# Patient Record
Sex: Female | Born: 1935 | Race: White | Hispanic: No | Smoking: Never smoker
Health system: Southern US, Community
[De-identification: ages and names within clinical notes are randomized; demographics above are authoritative.]

## PROBLEM LIST (undated history)

## (undated) DIAGNOSIS — D649 Anemia, unspecified: Secondary | ICD-10-CM

## (undated) DIAGNOSIS — IMO0002 Reserved for concepts with insufficient information to code with codable children: Secondary | ICD-10-CM

## (undated) DIAGNOSIS — K621 Rectal polyp: Secondary | ICD-10-CM

## (undated) DIAGNOSIS — E559 Vitamin D deficiency, unspecified: Secondary | ICD-10-CM

## (undated) DIAGNOSIS — N1330 Unspecified hydronephrosis: Secondary | ICD-10-CM

## (undated) DIAGNOSIS — N183 Chronic kidney disease, stage 3 (moderate): Secondary | ICD-10-CM

## (undated) DIAGNOSIS — G309 Alzheimer's disease, unspecified: Secondary | ICD-10-CM

## (undated) DIAGNOSIS — K623 Rectal prolapse: Secondary | ICD-10-CM

## (undated) DIAGNOSIS — N8110 Cystocele, unspecified: Secondary | ICD-10-CM

## (undated) DIAGNOSIS — F0280 Dementia in other diseases classified elsewhere without behavioral disturbance: Secondary | ICD-10-CM

## (undated) DIAGNOSIS — E785 Hyperlipidemia, unspecified: Secondary | ICD-10-CM

## (undated) DIAGNOSIS — K648 Other hemorrhoids: Principal | ICD-10-CM

## (undated) DIAGNOSIS — Z9181 History of falling: Secondary | ICD-10-CM

## (undated) HISTORY — DX: Anemia, unspecified: D64.9

## (undated) HISTORY — DX: Unspecified hydronephrosis: N13.30

## (undated) HISTORY — DX: Hyperlipidemia, unspecified: E78.5

## (undated) HISTORY — DX: History of falling: Z91.81

## (undated) HISTORY — PX: JOINT REPLACEMENT: SHX530

## (undated) HISTORY — PX: CATARACT EXTRACTION W/ INTRAOCULAR LENS  IMPLANT, BILATERAL: SHX1307

## (undated) HISTORY — DX: Chronic kidney disease, stage 3 (moderate): N18.3

## (undated) HISTORY — PX: APPENDECTOMY: SHX54

## (undated) HISTORY — DX: Alzheimer's disease, unspecified: G30.9

## (undated) HISTORY — DX: Vitamin D deficiency, unspecified: E55.9

## (undated) HISTORY — DX: Dementia in other diseases classified elsewhere, unspecified severity, without behavioral disturbance, psychotic disturbance, mood disturbance, and anxiety: F02.80

## (undated) HISTORY — DX: Other hemorrhoids: K64.8

## (undated) HISTORY — DX: Cystocele, unspecified: N81.10

## (undated) HISTORY — PX: OTHER SURGICAL HISTORY: SHX169

---

## 2002-12-06 ENCOUNTER — Emergency Department (HOSPITAL_COMMUNITY): Admission: EM | Admit: 2002-12-06 | Discharge: 2002-12-06 | Payer: Self-pay | Admitting: Emergency Medicine

## 2002-12-06 ENCOUNTER — Encounter: Payer: Self-pay | Admitting: Emergency Medicine

## 2004-07-07 ENCOUNTER — Inpatient Hospital Stay (HOSPITAL_COMMUNITY): Admission: EM | Admit: 2004-07-07 | Discharge: 2004-07-13 | Payer: Self-pay | Admitting: Emergency Medicine

## 2011-07-16 ENCOUNTER — Inpatient Hospital Stay (HOSPITAL_COMMUNITY)
Admission: EM | Admit: 2011-07-16 | Discharge: 2011-07-22 | DRG: 534 | Disposition: A | Discharge status: Skilled Nursing Facility | Payer: Medicare Other | Attending: Internal Medicine | Admitting: Internal Medicine

## 2011-07-16 ENCOUNTER — Emergency Department (HOSPITAL_COMMUNITY): Payer: Medicare Other

## 2011-07-16 DIAGNOSIS — Y92009 Unspecified place in unspecified non-institutional (private) residence as the place of occurrence of the external cause: Secondary | ICD-10-CM

## 2011-07-16 DIAGNOSIS — S72309A Unspecified fracture of shaft of unspecified femur, initial encounter for closed fracture: Principal | ICD-10-CM | POA: Diagnosis present

## 2011-07-16 DIAGNOSIS — Y998 Other external cause status: Secondary | ICD-10-CM

## 2011-07-16 DIAGNOSIS — M810 Age-related osteoporosis without current pathological fracture: Secondary | ICD-10-CM | POA: Diagnosis present

## 2011-07-16 DIAGNOSIS — N8111 Cystocele, midline: Secondary | ICD-10-CM | POA: Diagnosis present

## 2011-07-16 DIAGNOSIS — Z96649 Presence of unspecified artificial hip joint: Secondary | ICD-10-CM

## 2011-07-16 DIAGNOSIS — D259 Leiomyoma of uterus, unspecified: Secondary | ICD-10-CM | POA: Diagnosis present

## 2011-07-16 DIAGNOSIS — N189 Chronic kidney disease, unspecified: Secondary | ICD-10-CM | POA: Diagnosis present

## 2011-07-16 DIAGNOSIS — D518 Other vitamin B12 deficiency anemias: Secondary | ICD-10-CM | POA: Diagnosis present

## 2011-07-16 DIAGNOSIS — X58XXXA Exposure to other specified factors, initial encounter: Secondary | ICD-10-CM | POA: Diagnosis present

## 2011-07-16 DIAGNOSIS — B9689 Other specified bacterial agents as the cause of diseases classified elsewhere: Secondary | ICD-10-CM | POA: Diagnosis present

## 2011-07-16 DIAGNOSIS — Z7982 Long term (current) use of aspirin: Secondary | ICD-10-CM

## 2011-07-16 DIAGNOSIS — N390 Urinary tract infection, site not specified: Secondary | ICD-10-CM | POA: Diagnosis present

## 2011-07-16 DIAGNOSIS — D509 Iron deficiency anemia, unspecified: Secondary | ICD-10-CM | POA: Diagnosis present

## 2011-07-16 DIAGNOSIS — K8020 Calculus of gallbladder without cholecystitis without obstruction: Secondary | ICD-10-CM | POA: Diagnosis present

## 2011-07-16 DIAGNOSIS — N1330 Unspecified hydronephrosis: Secondary | ICD-10-CM | POA: Diagnosis present

## 2011-07-16 LAB — DIFFERENTIAL
Basophils Relative: 0 % (ref 0–1)
Eosinophils Absolute: 0.1 10*3/uL (ref 0.0–0.7)
Monocytes Absolute: 0.6 10*3/uL (ref 0.1–1.0)
Monocytes Relative: 7 % (ref 3–12)

## 2011-07-16 LAB — BASIC METABOLIC PANEL
Calcium: 9.9 mg/dL (ref 8.4–10.5)
Creatinine, Ser: 2.39 mg/dL — ABNORMAL HIGH (ref 0.50–1.10)
GFR calc non Af Amer: 19 mL/min — ABNORMAL LOW (ref >60)
Glucose, Bld: 111 mg/dL — ABNORMAL HIGH (ref 70–99)
Sodium: 137 mEq/L (ref 135–145)

## 2011-07-16 LAB — CBC
Hemoglobin: 10.6 g/dL — ABNORMAL LOW (ref 12.0–15.0)
MCH: 29.9 pg (ref 26.0–34.0)
MCHC: 32.5 g/dL (ref 30.0–36.0)
Platelets: 255 10*3/uL (ref 150–400)

## 2011-07-17 ENCOUNTER — Inpatient Hospital Stay (HOSPITAL_COMMUNITY): Payer: Medicare Other

## 2011-07-17 LAB — BASIC METABOLIC PANEL
CO2: 21 mEq/L (ref 19–32)
Calcium: 9.1 mg/dL (ref 8.4–10.5)
GFR calc Af Amer: 23 mL/min — ABNORMAL LOW (ref >60)
GFR calc non Af Amer: 19 mL/min — ABNORMAL LOW (ref >60)
Sodium: 137 mEq/L (ref 135–145)

## 2011-07-17 LAB — CBC
MCH: 30.1 pg (ref 26.0–34.0)
MCHC: 32.4 g/dL (ref 30.0–36.0)
Platelets: 247 10*3/uL (ref 150–400)
RBC: 3.12 MIL/uL — ABNORMAL LOW (ref 3.87–5.11)
RDW: 14 % (ref 11.5–15.5)

## 2011-07-17 LAB — IRON AND TIBC
Iron: 37 ug/dL — ABNORMAL LOW (ref 42–135)
TIBC: 208 ug/dL — ABNORMAL LOW (ref 250–470)

## 2011-07-17 NOTE — H&P (Signed)
[name], [name]                   ACCOUNT NO.:  [id number]  MEDICAL RECORD NO.:  [id number]  LOCATION:  WLED                         FACILITY:  [facility]  PHYSICIAN:  Gery Pray, MD      DATE OF BIRTH:  Jun 27, 1919  DATE OF ADMISSION:  07/16/2011 DATE OF DISCHARGE:                             HISTORY & PHYSICAL   PRIMARY CARE PHYSICIAN:  None.  CODE STATUS:  Full code.    The patient goes to team five.  CHIEF COMPLAINT:  Pain, left thigh.  HISTORY OF PRESENT ILLNESS:  This is a rather pleasant and healthy 77- year-old female who lives alone, ambulates with a cane.  She does not have a PCP as she has no health issues.  Today, she was walking down the driveway to retrieve her mail; on the way back, she developed some pain in her left leg.  She was able to make it to the step, however, she was not able to make it up the steps.  She is sat.  Her neighbor called her son who lived 2 miles away.  He and his wife were able to get her upstairs.  However, she remained nonambulatory. 911 was called, she was brought to the ER.  In the ER, imaging done.  All images are normal. However, the patient is not able to bear weight on that left extremity, secondary to pain, despite the fact that she has been given pain medications.  The hospitalist have been called with a request for admission.  History obtained from the patient and her son who is at the bedside.  Both are appeared to be reliable historians.  The patient is mentating clearly.  PAST MEDICAL HISTORY:  None.  PAST SURGICAL HISTORY:  Appendectomy, left hip replacement, and bilateral wrist surgeries for fractures.  MEDICATION:  Aspirin daily.  ALLERGIES:  No known drug allergies.  SOCIAL HISTORY:  Negative for tobacco, alcohol, or illicit drugs.  No home oxygen.  She uses a cane.  She lives alone.  Her son is Leonette Most, lives 2 miles away.  FAMILY HISTORY:  Significant for diabetes mellitus, a brother and coronary artery  disease, her father.  REVIEW OF SYSTEMS:  All 10-point systems reviewed.  They are negative, except as noted in HPI.  PHYSICAL EXAMINATION:  VITALS:  Blood pressure 156/66, pulse 92, respirations 16, temperature 98, sat 96% on room air. GENERAL:  Pleasant, alert, oriented female, currently in no acute distress. AAA:  Pink conjunctivae.  PERLA. ENT: Moist oral mucosa.  Trachea midline. NECK:  Supple.  No thyromegaly. LUNGS:  Clear to auscultation bilaterally.  No use of accessory muscles. CARDIOVASCULAR:  Regular rate and rhythm without murmurs, rigors, or gallops.  No JVD. ABDOMEN:  Soft, positive bowel sounds, nontender, nondistended.  No organomegaly.  NEURO:  Cranial nerves II through XII, grossly intact. Sensation intact. MUSCULOSKELETAL:  Correction strength 5/5 in all extremities.  No clubbing, cyanosis or edema.  The patient does have point tenderness at the back of the left calf and left thigh. SKIN: No rashes.  No subcutaneous crepitations. PSYCH:  Alert, oriented, appropriate, well-groomed female, appropriate.  LABS:  Sodium 137, potassium 5.2, chloride 105,  CO2 of 22, glucose 111, BUN 56, creatinine was 2.39, and calcium 9.9.  Left hip x-rays, no fractures seen the left hip.  The patient has arthroplasty, appears intact.  X-ray of left knee, 4-reviews, no evidence of fractures or dislocation.  Diffuse osteopenia.  White blood count 8.5, hemoglobin 10.6, platelets 255.  ASSESSMENT AND PLAN: 1. Intractable pain, left thigh, question pulled muscles, tendonitis.  The patient     will be admitted, pain medications will be ordered.  Physical     Therapy will be consulted.  We will order scheduled Tylenol around-     the-clock for now. 2. Kidney disease, likely chronic; however, unclear.  The patient has     not been to a physician in years.  We will order some IV fluid     hydrate some gentle intravenous fluid hydration to see if this     corrects.  No further workup will  be ordered at this point no renal     ultrasound.          ______________________________ Gery Pray, MD     DC/MEDQ  D:  07/16/2011  T:  07/16/2011  Job:  [id number]  Electronically Signed by Gery Pray MD on 07/17/2011 03:02:53 AM

## 2011-07-18 LAB — CBC
MCHC: 32 g/dL (ref 30.0–36.0)
Platelets: 222 10*3/uL (ref 150–400)
RDW: 13.9 % (ref 11.5–15.5)
WBC: 5.2 10*3/uL (ref 4.0–10.5)

## 2011-07-18 LAB — URINE MICROSCOPIC-ADD ON

## 2011-07-18 LAB — BASIC METABOLIC PANEL
Chloride: 108 mEq/L (ref 96–112)
Creatinine, Ser: 2.52 mg/dL — ABNORMAL HIGH (ref 0.50–1.10)
GFR calc Af Amer: 22 mL/min — ABNORMAL LOW (ref >60)
GFR calc non Af Amer: 18 mL/min — ABNORMAL LOW (ref >60)
Potassium: 4.8 mEq/L (ref 3.5–5.1)

## 2011-07-18 LAB — URINALYSIS, ROUTINE W REFLEX MICROSCOPIC
Glucose, UA: NEGATIVE mg/dL
Specific Gravity, Urine: 1.006 (ref 1.005–1.030)
pH: 8 (ref 5.0–8.0)

## 2011-07-19 LAB — BASIC METABOLIC PANEL
BUN: 58 mg/dL — ABNORMAL HIGH (ref 6–23)
Chloride: 108 mEq/L (ref 96–112)
GFR calc Af Amer: 22 mL/min — ABNORMAL LOW (ref >60)
GFR calc non Af Amer: 18 mL/min — ABNORMAL LOW (ref >60)
Potassium: 4.6 mEq/L (ref 3.5–5.1)
Sodium: 134 mEq/L — ABNORMAL LOW (ref 135–145)

## 2011-07-19 LAB — CBC
HCT: 29.9 % — ABNORMAL LOW (ref 36.0–46.0)
Hemoglobin: 9.5 g/dL — ABNORMAL LOW (ref 12.0–15.0)
MCHC: 31.8 g/dL (ref 30.0–36.0)
RDW: 13.9 % (ref 11.5–15.5)
WBC: 5.1 10*3/uL (ref 4.0–10.5)

## 2011-07-20 LAB — BASIC METABOLIC PANEL
BUN: 59 mg/dL — ABNORMAL HIGH (ref 6–23)
CO2: 19 mEq/L (ref 19–32)
Chloride: 109 mEq/L (ref 96–112)
GFR calc non Af Amer: 20 mL/min — ABNORMAL LOW (ref >60)
Glucose, Bld: 91 mg/dL (ref 70–99)
Potassium: 4.8 mEq/L (ref 3.5–5.1)
Sodium: 137 mEq/L (ref 135–145)

## 2011-07-20 LAB — CBC
HCT: 30.3 % — ABNORMAL LOW (ref 36.0–46.0)
Hemoglobin: 9.8 g/dL — ABNORMAL LOW (ref 12.0–15.0)
MCHC: 32.3 g/dL (ref 30.0–36.0)
RBC: 3.29 MIL/uL — ABNORMAL LOW (ref 3.87–5.11)
WBC: 5.2 10*3/uL (ref 4.0–10.5)

## 2011-07-21 NOTE — Consult Note (Signed)
[name], [name]                   ACCOUNT NO.:  [id number]  MEDICAL RECORD NO.:  [id number]  LOCATION:  1508                         FACILITY:  [facility]  PHYSICIAN:  [name], MD     DATE OF BIRTH:  07-30-1919  DATE OF CONSULTATION: DATE OF DISCHARGE:                                CONSULTATION   REFERRING PHYSICIAN:  Gery Pray, MD  REASON FOR CONSULTATION:  Hydronephrosis, elevated creatinine, possible pelvic organ prolapse.  HISTORY OF PRESENT ILLNESS:  [name] is a vigorous 77 year old female who takes no medications and has very limited medical care.  She noted to have acute onset of left hip pain and has prior history of left hip surgery and admitted to hospital service for possible fracture where she is noted to have a high creatinine of 2.4.  Renal exam was obtained which revealed moderate-to-severe hydroureteronephrosis down to the bladder.  The sonographer on this exam noted significant prolapse.  On discussion with the patient, she denies any dysuria or hematuria.  She is a G1, P1 from a vaginal delivery.  She does admit to many years of pelvic organ prolapse which she has learnt to adapt to mostly by manually reducing her bladder when she voids.  She has minimal stress incontinence by history with leakage of small volumes of urine with cough, sneeze, and change in position, but no evidence of leakage between the voids.  She feels like she voids at completion.  PAST MEDICAL HISTORY:  Remote history of left hip replacement, otherwise, unknown.  Again, this patient has very limited medical contact.  SURGICAL HISTORY:  Hip replacement and appendectomy.  CURRENT MEDICATIONS:  Daily aspirin.  ALLERGIES:  No known drug allergies.  FAMILY HISTORY:  Significant for diabetes and heart disease.  SOCIAL HISTORY:  The patient is retired from [employer].  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  She does not smoke or use illicit  substances.  REVIEW OF SYSTEMS:  NEUROLOGIC:  Denies any new weakness or numbness. CARDIAC:  Denies any chest pain.  PULMONARY:  Denies shortness of breath.  GASTROINTESTINAL:  Denies blood in stool.  GENITOURINARY: Denies blood in urine. Does admit to pelvic organ prolapse. MUSCULOSKELETAL:  Admits to recent left hip pain.  HEMATOLOGIC:  Denies easy bruising.  PSYCHIATRIC:  Denies depressed mood.  PHYSICAL EXAMINATION:  VITAL SIGNS:  Temp 98.3, pulse 68, respirations 18, and blood pressure 120/63. GENERAL:  [name] is a very pleasant female who appears much younger than her stated age. HEENT:  Normocephalic, atraumatic. CARDIOVASCULAR:  Regular rate and rhythm. PULMONARY:  Clear to auscultation. ABDOMINAL:  Mildly obese, nontender, and nondistended.  No masses appreciated.  No palpable bladder. EXTREMITIES:  2+ pulses throughout.  There is mild swelling of the left extremity. GENITOURINARY:  There is a very impressive grade 4 pelvic organ prolapse with the cervix arresting approximately 6 inches inferior to the introitus in a lying position.  This is easily reduced manually by the physician and by the patient.  LABORATORY INVESTIGATION:  Creatinine 2.4, potassium 4.4, hemoglobin 9.4.  IMAGING:  Reviewed renal ultrasound and concur the finding of moderate  hydroureteronephrosis at the level of the bladder.  The bladder was mildly distended during the study, again this is bilateral.  ASSESSMENT AND PLAN: 1. Hydronephrosis.  Elevated creatinine is likely a chronic process     due to obstruction very distally from pelvic organ prolapse that is     severe.  Patient's potassium is normal and she denies symptoms of     azotemia.  We reiterated to her the importance of good kidney     function and the need for complete voiding. 2. Pelvic organ prolapse.  The patient has adapted very well to her     pelvic organ prolapse and that she reduces her bladder when she     voids.  We  reiterated the importance of this as well as double     voiding to ensure no postvoid residual.  The patient does this     during exam and is able to achieve a postvoid residual of less than     165.  We discussed options including a pessary or surgical options     for pelvic organ prolapse, and the patient declines at this time. 3. Followup.  I discussed with the patient again the importance of     maintaining good kidney function and recommended followup with me     in the office.  Given a history of lack of medical contact, I     reiterated this and she voiced understanding.          ______________________________ [name], MD     TM/MEDQ  D:  07/17/2011  T:  07/18/2011  Job:  [id number]  Electronically Signed by Lindaann Slough M.D. on 07/21/2011 05:23:53 PM

## 2011-07-22 LAB — URINE CULTURE
Colony Count: >=100000
Culture  Setup Time: 201208210507
Special Requests: NEGATIVE

## 2011-07-29 NOTE — Discharge Summary (Signed)
[name], [name]                   ACCOUNT NO.:  [id number]  MEDICAL RECORD NO.:  [id number]  LOCATION:  1508                         [facility]ACILITY:  [facility]  PHYSICIAN:  [name], M.D.     DATE OF BIRTH:  18-May-1919  DATE OF ADMISSION:  07/16/2011 DATE OF DISCHARGE:                              DISCHARGE SUMMARY   PRIMARY CARE PHYSICIAN:  The patient is unable to remember.  PRESENTING COMPLAINT:  Pain in left thigh.  DISCHARGE DIAGNOSES: 1. Acute left leg pain, possibly secondary to occult fracture.  The     patient was evaluated by Ortho. 2. Chronic hydronephrosis secondary to bladder prolapse resulting in     chronic renal failure. 3. Urinary tract infection with Klebsiella oxytoca sensitive to Cipro.     Today is day 5 out of 7 of treatment. 4. Anemia with iron and B12 deficiency.  No further workup done during     hospital stay.  DISCHARGE MEDICATIONS: 1. Aspirin 81 mg daily.  The patient was previously taking 325. 2. Ciprofloxacin 250 mg daily for 3 more days.  This would include     today. 3. Cyanocobalamin 1000 mcg monthly. 4. Ferrous sulfate 325 mg by mouth twice a day with meal. 5. Calcium OTC 1 tablet twice a day.  STOPPED THE FOLLOWING MEDICINE:  Aspirin 325 mg daily.  PROCEDURES: 1. Left knee 4-view on July 16, 2011, no evidence of fracture or     dislocation, diffuse osteopenia, visualized osseous structures. 2. X-ray hip left complete July 16, 2011, no evidence of fracture or     dislocation, left hip hemiarthroplasty appears intact without     evidence of loosening or fracture.  Calcified fibroids incidentally     noted. 3. MRI of the left hip without contrast July 17, 2011.  There is     abnormal soft tissue signal medial to the proximal left femoral     diaphysis extending to the distal end of the femoral prosthesis,     status post bipolar hemiarthroplasty.  While findings may nearly     reflect a soft tissue injury, an underlying nondisplaced  fracture     of stress fracture of the femur cannot be excluded.  No evidence of     pelvic fracture or acute right hip abnormality.  Distended     trabeculated bladder with bilateral ureterectasis likely related to     apparent cystocele formation. 4. Ultrasound renal July 17, 2011, severe bilateral hydronephrosis.     In comparison to the accompanying MRI, ureteral dilatation extends     all the way to the bladder base.  The bladder base has prolapsed     beyond the pelvic floor and this is likely the cause of the     ureteral obstruction.  Incidental cholelithiasis. 5. CT left hip without contrast.  Left bipolar hip hemiarthroplasty.     Subperiosteal new bone around the distal stem is consistent with     chronic stress reaction.  Correlating with MRI, there was bone     marrow edema and soft tissue edema surrounding this region.  CONSULTANTS: 1. Orthopedic consult with [name], MD. 2.  Urology consult with [name], MD.  HOSPITAL COURSE:  This is a 77 year old female who came into the ER with a complaint of pain in the left thigh.  Please see H and P dictated on July 16, 2011, for further details.  Above-mentioned workup was done and a consult with Orthopedics was requested.  According to Dr. Diamantina Providence transcription it could be an occult fracture versus a stress reaction. His plan was touchdown weightbearing with physical therapy and potential surgery if she is not able to progress with her weightbearing.  The patient was evaluated by Physical Therapy and interestingly was able to walk without any pain.  She has continued to walk since that day without pain, but with assistance.  She had not required any pain medications. According to Physical Therapy and Occupational Therapy notes, the patient requires skilled facility.  The patient was also found to have renal failure on admission with a BUN of 57 and creatinine of 2.41.  We do not have old records, and therefore,  we were unable to compare this.  However throughout her stay, these numbers have remained essentially stable.  Further workup revealed that she had bladder prolapse and a hydronephrosis, which is thought to be chronic.  She was evaluated by Dr. [name], who discussed with her option such as pessary or surgery for pelvic organ prolapse and the patient declined.  The patient did admit to him that she is able to reduce her bladder and voids quite well.  He recommended double voiding to ensure no postvoid residual and he recommended that she follow up with him.  We will be trying to make an appointment if we are unable to make an appointment, we will leave a number on the discharge paper work and appointment can be made by the patient's son.  The patient was found to have a positive UA.  She was started on ciprofloxacin.  Urine culture has resulted in Klebsiella oxytoca, which is sensitive to ciprofloxacin.  Today is day 5 of her treatment, I was considering giving her a 7-day course.  The patient was also noted to be anemic on her blood work with a hemoglobin of 9.4.  Anemia panel was performed and she was found to be iron deficient with iron level of 37 and a percent saturation of 18. Total iron binding capacity was also low.  Ferritin was low normal at 32. Vitamin B12 was slightly below the normal at 205, cutoff being 211. She has been receiving 100 mcg of IM vitamin B12 injections for the past 4 days.  We have also started her on p.o. iron.  We have not yet done stool occults or any further workup.  This should be done as an outpatient.  The patient is being discharged today to a nursing facility.  She and her son are in agreement with this.  Hopefully after being discharged from the facility, she can go to live with her son.  She was previously living at home alone.  PHYSICAL EXAMINATION:  GENERAL:  She is awake, alert, and oriented x3. LUNGS:  Clear. HEART:  Regular rate  and rhythm.  No murmurs. ABDOMEN:  Soft, nontender, and nondistended.  Bowel sounds positive. EXTREMITIES:  No cyanosis, clubbing, or edema.  CONDITION ON DISCHARGE:  Stable.  FOLLOWUP INSTRUCTIONS: 1. Follow up with PCP, again the patient cannot recall the name. 2. Follow up with Dr. [name], Urology. 3. Follow up with Dr. Dorene Grebe, Orthopedics.  Time on patient care and discharge  was 50 minutes.     [name], M.D.     SR/MEDQ  D:  07/22/2011  T:  07/22/2011  Job:  [id number]  cc:   G. Dorene Grebe, M.D. Fax: [phone]  [name], MD Fax: [phone]  Electronically Signed by [name] M.D. on 07/29/2011 10:58:44 AM

## 2011-08-05 ENCOUNTER — Ambulatory Visit
Admission: RE | Admit: 2011-08-05 | Discharge: 2011-08-05 | Disposition: A | Discharge status: Home / Self Care | Payer: Medicare Other | Source: Ambulatory Visit | Attending: Orthopedic Surgery | Admitting: Orthopedic Surgery

## 2011-08-05 ENCOUNTER — Other Ambulatory Visit: Payer: Self-pay | Admitting: Orthopedic Surgery

## 2011-08-05 DIAGNOSIS — R609 Edema, unspecified: Secondary | ICD-10-CM

## 2011-08-05 DIAGNOSIS — R52 Pain, unspecified: Secondary | ICD-10-CM

## 2011-08-10 NOTE — Consult Note (Signed)
NAMEHARRIETTE, [name]                   ACCOUNT NO.:  [id number]  MEDICAL RECORD NO.:  [id number]  LOCATION:  1508                         FACILITY:  [facility]  PHYSICIAN:  [name], M.D.    DATE OF BIRTH:  September 30, 1919  DATE OF CONSULTATION:  07/18/2011 DATE OF DISCHARGE:                                CONSULTATION   REFERRING PHYSICIAN:  Consult is requested by [facility].  CHIEF COMPLAINT:  Left leg pain.  HISTORY OF PRESENT ILLNESS:  [name] is a 77 year old ambulatory female who lives alone at home.  Two days ago, she was picking up the paper from the driveway.  She bent over and picked up the paper and was walking back and had acute onset of left leg pain to the point where she could not weightbear.  She had no prodromal symptoms.  Denies any back pain, numbness, and tingling in the leg.  After continued inability to bear weight on the leg, she was taken by ambulance here for further evaluation.  PAST MEDICAL HISTORY:  Negative. PAST SURGICAL HISTORY:  Appendectomy.  She did have a left bipolar hemiarthroplasty done in approximately 2005 by Dr. Chaney Malling with AML press-fit prosthesis; she did well with that surgery until her current situation.  MEDICATIONS:  Aspirin daily.  ALLERGIES:  No known drug allergies.  The patient's social history is negative for alcohol, tobacco, illicit drugs.  She uses a cane for ambulation.  Her son lives about 2 miles away.  Family history is significant for diabetes in the brother, coronary artery disease in the father.  All systems reviewed are negative as they relate to the left leg. Specifically, no numbness or tingling and no back pain.  PHYSICAL EXAMINATION:  GENERAL:  She is well developed, well nourished, in no acute distress, alert and oriented, answers questions appropriately. EXTREMITIES:  Has palpable pedal pulses bilaterally.  No groin pain on internal rotation of either leg.  Leg lengths are equal.  On the  left side, there is no bruising.  She has full active extension and flexion at the knee.  She has active hip flexion without pain or tenderness.  No pain with log rolling of the hip.  No paresthesias in dorsal and plantar aspect of the foot.  No real, definite tenderness to palpation.  On the anterolateral aspect of the leg, she does report some pain along the "posterior leaders."  Radiology studies are reviewed.  They include a CT scan which shows no definite fracture.  No evidence of loosening of the prosthesis.  MRI scan does show some bone thickening at the distal end of the prosthesis along with edema along the distal end of the prosthesis.  White count is 5000.  IMPRESSION:  Acute left leg pain starting 2 days ago.  Clinically, she is improved since that time.  There are really 3 possibilities to this clinical scenario:  Occult fracture at the tip of the stem which is unlikely with 2 studies, CT and MRI scan, showing no definite fracture. This has to be considered a possibility with the edema at the distal aspect of the stem.  There is no evidence of loosening of  the stem. Second possibility is this is a soft tissue injury.  Again possible, but the edema pattern makes it a little bit less likely.  It is not particularly in the hamstring muscle belly itself, but more around the bone.  Other option raised by the radiologist would be stress reaction. Again, a little bit unlikely as this pattern of new bone formation is found at the end of an AML stem.  In my examination, I did apply about 40 pounds of pressure to her locked knee on the left-hand side without any symptoms.  Her hip is very irritable today, especially with torsional stress.  My plan would be touchdown weightbearing with the assistance of physical therapy.  If she is not able to undergo physical therapy and not able to tolerate touchdown weightbearing, we may need to consider either re-imaging or fixation of the stem  in the region of the distal tip.  I would not favor removal of the prosthesis based on its well-fixed nature.  Infection is unlikely based on the white count.  PLAN:  At this time, touchdown weightbearing with physical therapy, continued hospitalization for the possibility of potential surgery if she is not able to progress with her weightbearing.  We will follow up with her this week.     [name], M.D.     GSD/MEDQ  D:  07/18/2011  T:  07/19/2011  Job:  [id number]  Electronically Signed by Reece Agar.  Alicen Donalson M.D. on 08/10/2011 08:33:13 AM

## 2012-02-28 ENCOUNTER — Emergency Department (HOSPITAL_COMMUNITY)
Admission: EM | Admit: 2012-02-28 | Discharge: 2012-02-28 | Disposition: A | Discharge status: Home / Self Care | Payer: Medicare Other | Attending: Emergency Medicine | Admitting: Emergency Medicine

## 2012-02-28 ENCOUNTER — Encounter (HOSPITAL_COMMUNITY): Payer: Self-pay | Admitting: Emergency Medicine

## 2012-02-28 DIAGNOSIS — Z7982 Long term (current) use of aspirin: Secondary | ICD-10-CM | POA: Insufficient documentation

## 2012-02-28 DIAGNOSIS — K5900 Constipation, unspecified: Secondary | ICD-10-CM | POA: Insufficient documentation

## 2012-02-28 DIAGNOSIS — K921 Melena: Secondary | ICD-10-CM | POA: Insufficient documentation

## 2012-02-28 DIAGNOSIS — N898 Other specified noninflammatory disorders of vagina: Secondary | ICD-10-CM | POA: Insufficient documentation

## 2012-02-28 DIAGNOSIS — K623 Rectal prolapse: Secondary | ICD-10-CM

## 2012-02-28 DIAGNOSIS — R1032 Left lower quadrant pain: Secondary | ICD-10-CM | POA: Insufficient documentation

## 2012-02-28 DIAGNOSIS — R319 Hematuria, unspecified: Secondary | ICD-10-CM | POA: Insufficient documentation

## 2012-02-28 DIAGNOSIS — Z79899 Other long term (current) drug therapy: Secondary | ICD-10-CM | POA: Insufficient documentation

## 2012-02-28 HISTORY — DX: Reserved for concepts with insufficient information to code with codable children: IMO0002

## 2012-02-28 LAB — DIFFERENTIAL
Basophils Absolute: 0 10*3/uL (ref 0.0–0.1)
Basophils Relative: 1 % (ref 0–1)
Eosinophils Absolute: 0.1 10*3/uL (ref 0.0–0.7)
Eosinophils Relative: 2 % (ref 0–5)
Lymphocytes Relative: 22 % (ref 12–46)
Lymphs Abs: 1.4 K/uL (ref 0.7–4.0)
Monocytes Absolute: 0.3 10*3/uL (ref 0.1–1.0)
Monocytes Relative: 5 % (ref 3–12)
Neutro Abs: 4.4 10*3/uL (ref 1.7–7.7)
Neutrophils Relative %: 71 % (ref 43–77)

## 2012-02-28 LAB — CBC
HCT: 31.2 % — ABNORMAL LOW (ref 36.0–46.0)
Hemoglobin: 10 g/dL — ABNORMAL LOW (ref 12.0–15.0)
MCH: 30.2 pg (ref 26.0–34.0)
MCHC: 32.1 g/dL (ref 30.0–36.0)
MCV: 94.3 fL (ref 78.0–100.0)
Platelets: 263 K/uL (ref 150–400)
RBC: 3.31 MIL/uL — ABNORMAL LOW (ref 3.87–5.11)
RDW: 14 % (ref 11.5–15.5)
WBC: 6.2 K/uL (ref 4.0–10.5)

## 2012-02-28 LAB — COMPREHENSIVE METABOLIC PANEL
AST: 11 U/L (ref 0–37)
Albumin: 3.1 g/dL — ABNORMAL LOW (ref 3.5–5.2)
BUN: 63 mg/dL — ABNORMAL HIGH (ref 6–23)
Calcium: 9.3 mg/dL (ref 8.4–10.5)
Creatinine, Ser: 2.3 mg/dL — ABNORMAL HIGH (ref 0.50–1.10)
Total Bilirubin: 0.1 mg/dL — ABNORMAL LOW (ref 0.3–1.2)
Total Protein: 7 g/dL (ref 6.0–8.3)

## 2012-02-28 LAB — PROTIME-INR
INR: 0.97 (ref 0.00–1.49)
Prothrombin Time: 13.1 seconds (ref 11.6–15.2)

## 2012-02-28 LAB — COMPREHENSIVE METABOLIC PANEL WITH GFR
ALT: 9 U/L (ref 0–35)
Alkaline Phosphatase: 58 U/L (ref 39–117)
CO2: 18 meq/L — ABNORMAL LOW (ref 19–32)
Chloride: 103 meq/L (ref 96–112)
GFR calc Af Amer: 20 mL/min — ABNORMAL LOW (ref >90)
GFR calc non Af Amer: 17 mL/min — ABNORMAL LOW (ref >90)
Glucose, Bld: 115 mg/dL — ABNORMAL HIGH (ref 70–99)
Potassium: 4.6 meq/L (ref 3.5–5.1)
Sodium: 133 meq/L — ABNORMAL LOW (ref 135–145)

## 2012-02-28 LAB — APTT: aPTT: 29 seconds (ref 24–37)

## 2012-02-28 MED ORDER — DOCUSATE SODIUM 100 MG PO CAPS: 100.0000 mg | ORAL_CAPSULE | Freq: Two times a day (BID) | ORAL | Status: AC

## 2012-02-28 NOTE — ED Provider Notes (Signed)
History     CSN: [id number]  Arrival date & time 02/28/12  1931   First MD Initiated Contact with Patient 02/28/12 1935      Chief Complaint  Patient presents with  . Vaginal Bleeding  . Rectal Bleeding    (Consider location/radiation/quality/duration/timing/severity/associated sxs/prior treatment) HPI Comments: Patient lives alone and reportedly about one or 2 hours prior to arrival was sitting on the toilet trying to have a bowel movement. She reports not straining. Family recalls that she may have mentioned to them earlier today that she wanted to try taking a laxative which implies she may have been constipated. She does have a history of mild constipation in the past. She denies prior history of hemorrhoids. She notes that she restarted having bleeding into the toilet that was quite extensive and she this is not stopping. She is not on blood thinners other than baby aspirin. She also has a history of prolapsed bladder. She denies urinary frequency or dysuria. Here upon arrival, the patient reports that she has developed a little bit of discomfort in her left lower quadrant of her abdomen. She denies back or flank pain. No skin rash. No nausea vomiting.  Patient is a 78 y.o. female presenting with vaginal bleeding and hematochezia. The history is provided by the patient and a relative.  Vaginal Bleeding Associated symptoms include abdominal pain. Pertinent negatives include no chest pain and no shortness of breath.  Rectal Bleeding  Associated symptoms include abdominal pain and hematuria. Pertinent negatives include no nausea, no vomiting, no chest pain and no coughing.    Past Medical History  Diagnosis Date  . Prolapsed bladder     History reviewed. No pertinent past surgical history.  History reviewed. No pertinent family history.  History  Substance Use Topics  . Smoking status: Not on file  . Smokeless tobacco: Not on file  . Alcohol Use:     OB History    Grav Para  Term Preterm Abortions TAB SAB Ect Mult Living                  Review of Systems  Constitutional: Negative.   Respiratory: Negative for cough and shortness of breath.   Cardiovascular: Negative for chest pain.  Gastrointestinal: Positive for abdominal pain, constipation, blood in stool and hematochezia. Negative for nausea and vomiting.  Genitourinary: Positive for hematuria. Negative for dysuria and flank pain.  Musculoskeletal: Negative for back pain.  Neurological: Negative for dizziness and light-headedness.  All other systems reviewed and are negative.    Allergies  Review of patient's allergies indicates no known allergies.  Home Medications   Current Outpatient Rx  Name Route Sig Dispense Refill  . ASPIRIN 325 MG PO TABS Oral Take 325 mg by mouth daily.    Marland Kitchen DOCUSATE SODIUM 100 MG PO CAPS Oral Take 1 capsule (100 mg total) by mouth every 12 (twelve) hours. 60 capsule 0    BP 169/72  Pulse 85  Temp(Src) 98 F (36.7 C) (Oral)  Resp 18  SpO2 97%  Physical Exam  Nursing note and vitals reviewed. Constitutional: She appears well-developed and well-nourished. No distress.  Cardiovascular: Normal rate.   Pulmonary/Chest: Effort normal. No respiratory distress. She has no wheezes.  Abdominal: Soft.  Genitourinary:     Neurological: She is alert.  Skin: Skin is warm and dry.    ED Course  Hernia reduction Date/Time: 02/28/2012 9:00 PM Performed by: Lear Ng. Authorized by: Lear Ng Consent: The procedure was  performed in an emergent situation. Patient identity confirmed: verbally with patient Time out: Immediately prior to procedure a "time out" was called to verify the correct patient, procedure, equipment, support staff and site/side marked as required. Local anesthesia used: no Patient sedated: no Patient tolerance: Patient tolerated the procedure well with no immediate complications. Comments: Rectal prolapse was reduced manually.  Pt  tolerated well.  Bladder prolapse noted, but apparently is chronic and known condition.   (including critical care time)  Labs Reviewed  CBC - Abnormal; Notable for the following:    RBC 3.31 (*)    Hemoglobin 10.0 (*)    HCT 31.2 (*)    All other components within normal limits  COMPREHENSIVE METABOLIC PANEL - Abnormal; Notable for the following:    Sodium 133 (*)    CO2 18 (*)    Glucose, Bld 115 (*)    BUN 63 (*)    Creatinine, Ser 2.30 (*)    Albumin 3.1 (*)    Total Bilirubin 0.1 (*)    GFR calc non Af Amer 17 (*)    GFR calc Af Amer 20 (*)    All other components within normal limits  DIFFERENTIAL  APTT  PROTIME-INR  URINALYSIS, ROUTINE W REFLEX MICROSCOPIC   No results found.   1. Rectal prolapse       MDM   Patient with rectal prolapse on physical examination. Patient tolerated reduction very easily patient remained stable here in emergency department. She has baseline renal insufficiency which is not different from her usual. Also mild anemia is also stable. Blood pressure here has normalized. I spoke to Dr. Johna Sheriff with general surgery who reports that their group does not currently have a colorectal surgeon but will have one in mid July. Have explained this to the patient and family member. She was given a prescription for stool softeners and she'll need close followup with her primary care physician for referral to a colorectal surgeon if any earlier appointment as desired.        Gavin Pound. Oletta Lamas, MD 02/28/12 2307

## 2012-02-28 NOTE — ED Notes (Signed)
AVW:[id number]<XB> Expected date:<BR> Expected time: 7:37 PM<BR> Means of arrival:Ambulance<BR> Comments:<BR> M120 -- GI Bleed?

## 2012-02-28 NOTE — ED Notes (Signed)
Pt states that she went to the bathroom about an hour ago and had "some bleeding" and called her family. No hx of such. Hx of prolapsed bladder. Blood was seen on linens in stretcher where she had been sitting. No weakness, pain, lighheadedness, etc..Marland Kitchen

## 2012-03-26 ENCOUNTER — Ambulatory Visit (INDEPENDENT_AMBULATORY_CARE_PROVIDER_SITE_OTHER): Payer: PRIVATE HEALTH INSURANCE | Admitting: Surgery

## 2012-05-29 ENCOUNTER — Encounter (HOSPITAL_COMMUNITY): Payer: Self-pay | Admitting: *Deleted

## 2012-05-29 ENCOUNTER — Emergency Department (HOSPITAL_COMMUNITY)
Admission: EM | Admit: 2012-05-29 | Discharge: 2012-05-29 | Disposition: A | Discharge status: Home / Self Care | Payer: Medicare Other | Attending: Emergency Medicine | Admitting: Emergency Medicine

## 2012-05-29 DIAGNOSIS — K623 Rectal prolapse: Secondary | ICD-10-CM | POA: Insufficient documentation

## 2012-05-29 DIAGNOSIS — Z7982 Long term (current) use of aspirin: Secondary | ICD-10-CM | POA: Insufficient documentation

## 2012-05-29 HISTORY — DX: Rectal prolapse: K62.3

## 2012-05-29 LAB — CBC WITH DIFFERENTIAL/PLATELET
Eosinophils Absolute: 0.2 10*3/uL (ref 0.0–0.7)
Hemoglobin: 9.9 g/dL — ABNORMAL LOW (ref 12.0–15.0)
Lymphs Abs: 1.2 10*3/uL (ref 0.7–4.0)
MCH: 29.6 pg (ref 26.0–34.0)
Neutro Abs: 5.3 10*3/uL (ref 1.7–7.7)
Neutrophils Relative %: 74 % (ref 43–77)
Platelets: 258 10*3/uL (ref 150–400)
RBC: 3.35 MIL/uL — ABNORMAL LOW (ref 3.87–5.11)
WBC: 7.2 10*3/uL (ref 4.0–10.5)

## 2012-05-29 LAB — BASIC METABOLIC PANEL
Chloride: 106 mEq/L (ref 96–112)
GFR calc Af Amer: 16 mL/min — ABNORMAL LOW (ref >90)
GFR calc non Af Amer: 14 mL/min — ABNORMAL LOW (ref >90)
Glucose, Bld: 93 mg/dL (ref 70–99)
Potassium: 4.7 mEq/L (ref 3.5–5.1)
Sodium: 135 mEq/L (ref 135–145)

## 2012-05-29 LAB — OCCULT BLOOD, POC DEVICE: Fecal Occult Bld: POSITIVE

## 2012-05-29 MED ORDER — SODIUM CHLORIDE 0.9 % IV BOLUS (SEPSIS): 500.0000 mL | Freq: Once | INTRAVENOUS | Status: DC

## 2012-05-29 NOTE — ED Provider Notes (Signed)
Medical screening examination/treatment/procedure(s) were conducted as a shared visit with non-physician practitioner(s) and myself.  I personally evaluated the patient during the encounter.  89yF with rectal prolapse. Reduced. No complaints prior to DC. My abdominal exam benign. Per son hx of same 1 time previously. Return precautions discussed. Outpt fu.  Raeford Razor, MD 05/29/12 [phone]

## 2012-05-29 NOTE — ED Notes (Signed)
Pt states she went to the bathroom to void and noticedbleeding. Pt is unsure if it is rectal bleed or from the bladder. Pt states she has history of rectal and bladder prolapsed.

## 2012-05-29 NOTE — ED Provider Notes (Signed)
History     CSN: [id number]  Arrival date & time 05/29/12  1737   First MD Initiated Contact with Patient 05/29/12 1838     6:47 PM HPI Family reports rectal prolapse that occurred today. Reports area continues to bleed. Reports similar symptoms in the past. Patient reports she is asymptomatic, Denies abdominal pain, rectal pain, n/v, dizziness.  Patient is a 78 y.o. female presenting with hematochezia. The history is provided by the patient.  Rectal Bleeding  The current episode started today. The onset was sudden. The problem occurs continuously. The problem has been unchanged. The patient is experiencing no pain. Pertinent negatives include no fever, no abdominal pain, no diarrhea, no hematemesis, no hemorrhoids, no nausea, no rectal pain, no vomiting, no hematuria, no vaginal discharge and no chest pain. Urine output has been normal. The last void occurred 13 to 24 hours ago. Her past medical history does not include inflammatory bowel disease or recent abdominal injury. There were no sick contacts.    Past Medical History  Diagnosis Date  . Prolapsed bladder   . Rectal prolapse     Past Surgical History  Procedure Date  . Appendectomy     No family history on file.  History  Substance Use Topics  . Smoking status: Never Smoker   . Smokeless tobacco: Not on file  . Alcohol Use: No    OB History    Grav Para Term Preterm Abortions TAB SAB Ect Mult Living                  Review of Systems  Constitutional: Negative for fever and chills.  Respiratory: Negative for shortness of breath.   Cardiovascular: Negative for chest pain.  Gastrointestinal: Positive for hematochezia and anal bleeding. Negative for nausea, vomiting, abdominal pain, diarrhea, constipation, rectal pain, hematemesis and hemorrhoids.  Genitourinary: Negative for dysuria, urgency, frequency, hematuria, flank pain, vaginal discharge and vaginal pain.  Musculoskeletal: Negative for back pain.  All other  systems reviewed and are negative.    Allergies  Review of patient's allergies indicates no known allergies.  Home Medications   Current Outpatient Rx  Name Route Sig Dispense Refill  . ASPIRIN EC 81 MG PO TBEC Oral Take 81 mg by mouth daily.    Marland Kitchen DOCUSATE SODIUM 100 MG PO CAPS Oral Take 100 mg by mouth 2 (two) times daily.    . ADULT MULTIVITAMIN W/MINERALS CH Oral Take 1 tablet by mouth daily.      BP 130/72  Pulse 79  Temp 98.8 F (37.1 C) (Oral)  Resp 16  SpO2 99%  Physical Exam  Vitals reviewed. Constitutional: She is oriented to person, place, and time. Vital signs are normal. She appears well-developed and well-nourished.  HENT:  Head: Normocephalic and atraumatic.  Eyes: Conjunctivae are normal. Pupils are equal, round, and reactive to light.  Neck: Normal range of motion. Neck supple.  Cardiovascular: Normal rate, regular rhythm and normal heart sounds.  Exam reveals no friction rub.   No murmur heard. Pulmonary/Chest: Effort normal and breath sounds normal. She has no wheezes. She has no rhonchi. She has no rales. She exhibits no tenderness.  Abdominal: Soft. Bowel sounds are normal. She exhibits no distension and no mass. There is no tenderness. There is no rebound and no guarding.  Genitourinary: Rectal exam shows mass (Bloody rectal prolapse). Rectal exam shows no external hemorrhoid, no internal hemorrhoid and no tenderness. Guaiac positive stool.  Musculoskeletal: Normal range of motion.  Neurological: She is  alert and oriented to person, place, and time. Coordination normal.  Skin: Skin is warm and dry. No rash noted. No erythema. No pallor.    ED Course  Procedures   Results for orders placed during the hospital encounter of 05/29/12  CBC WITH DIFFERENTIAL      Component Value Range   WBC 7.2  4.0 - 10.5 K/uL   RBC 3.35 (*) 3.87 - 5.11 MIL/uL   Hemoglobin 9.9 (*) 12.0 - 15.0 g/dL   HCT 21.3 (*) 08.6 - 57.8 %   MCV 94.0  78.0 - 100.0 fL   MCH 29.6   26.0 - 34.0 pg   MCHC 31.4  30.0 - 36.0 g/dL   RDW 46.9  62.9 - 52.8 %   Platelets 258  150 - 400 K/uL   Neutrophils Relative 74  43 - 77 %   Neutro Abs 5.3  1.7 - 7.7 K/uL   Lymphocytes Relative 17  12 - 46 %   Lymphs Abs 1.2  0.7 - 4.0 K/uL   Monocytes Relative 7  3 - 12 %   Monocytes Absolute 0.5  0.1 - 1.0 K/uL   Eosinophils Relative 2  0 - 5 %   Eosinophils Absolute 0.2  0.0 - 0.7 K/uL   Basophils Relative 0  0 - 1 %   Basophils Absolute 0.0  0.0 - 0.1 K/uL  BASIC METABOLIC PANEL      Component Value Range   Sodium 135  135 - 145 mEq/L   Potassium 4.7  3.5 - 5.1 mEq/L   Chloride 106  96 - 112 mEq/L   CO2 21  19 - 32 mEq/L   Glucose, Bld 93  70 - 99 mg/dL   BUN 58 (*) 6 - 23 mg/dL   Creatinine, Ser 4.13 (*) 0.50 - 1.10 mg/dL   Calcium 9.3  8.4 - 24.4 mg/dL   GFR calc non Af Amer 14 (*) >90 mL/min   GFR calc Af Amer 16 (*) >90 mL/min  OCCULT BLOOD, POC DEVICE      Component Value Range   Fecal Occult Bld POSITIVE      MDM  Successful rectal prolapse reduction. Patient tolerated well. Denies pain and did not require any pain medication. Labs within normal limit or baseline. Discussed this with patient and family. Strongly advised Gen surg f/u. Pt and family voice understanding and are ready for d/c. Also advised close follow-up with PCP, Dr Alonna Minium for Hemoglobin recheck.       Thomasene Lot, PA-C 05/29/12 2036

## 2012-06-27 ENCOUNTER — Encounter (INDEPENDENT_AMBULATORY_CARE_PROVIDER_SITE_OTHER): Payer: Self-pay | Admitting: Surgery

## 2012-06-27 ENCOUNTER — Ambulatory Visit (INDEPENDENT_AMBULATORY_CARE_PROVIDER_SITE_OTHER): Payer: PRIVATE HEALTH INSURANCE | Admitting: Surgery

## 2012-06-27 ENCOUNTER — Ambulatory Visit (INDEPENDENT_AMBULATORY_CARE_PROVIDER_SITE_OTHER): Payer: Medicare Other | Admitting: Surgery

## 2012-06-27 VITALS — BP 150/72 | HR 72 | Temp 97.8°F | Resp 16 | Ht 60.0 in | Wt 150.2 lb

## 2012-06-27 DIAGNOSIS — N1330 Unspecified hydronephrosis: Secondary | ICD-10-CM

## 2012-06-27 DIAGNOSIS — N8111 Cystocele, midline: Secondary | ICD-10-CM

## 2012-06-27 DIAGNOSIS — N8110 Cystocele, unspecified: Secondary | ICD-10-CM

## 2012-06-27 DIAGNOSIS — K648 Other hemorrhoids: Secondary | ICD-10-CM

## 2012-06-27 DIAGNOSIS — N183 Chronic kidney disease, stage 3 unspecified: Secondary | ICD-10-CM

## 2012-06-27 HISTORY — DX: Cystocele, unspecified: N81.10

## 2012-06-27 HISTORY — DX: Chronic kidney disease, stage 3 unspecified: N18.30

## 2012-06-27 HISTORY — DX: Other hemorrhoids: K64.8

## 2012-06-27 NOTE — Progress Notes (Signed)
Subjective:     Patient ID: [name], female   DOB: 1919-06-20, 78 y.o.   MRN: [id number]  HPI  [name]  Jan 02, 1919 [id number]  Patient Care Team: Oneal Grout, MD as PCP - General (Internal Medicine) Lindaann Slough, MD as Consulting Physician (Urology)  This patient is a 78 y.o.female who presents today for surgical evaluation at the request of Dr. Glade Lloyd.   Reason for evaluation: Rectal bleeding and prolapse.  Patient is a pleasant elderly female.  Has avoided doctors for most of her entire life.  Has never had a colonoscopy.  She had an episode of bleeding in April and in July from her pelvis.  She called her son was concerned.  He said was a moderate amount.  The patient was brought into emergency room.  They note prolapse.  It was reduced.  She initially declined following up in April but after this more recent episode her family brought her in for Surgical evaluation.  The son thinks she's constipated but the patient insists she has a bowel movement every day.  She's never had any problems with fecal incontinence nor incontinence to flatus.  No soiling of her underwear.  No chronic drainage.  She's never had any "accidents".  She never wears any diapers She's had some blood.  No massive clots.  The patient also has significant bladder prolapse.  That is what seems to be noted on prior notes.  She's been seen by [facility].  There is discussion of at least of a pessary versus a pelvic floor reconstruction.  She an elevated creatinine.  She had hydronephrosis.  The severely prolapsed bladder is felt to be at least contributing or causing this.  She has declined any intervention or treatment.She comes in today with her grandmother and son.  They have never witnessed any prolapse but they have seen bleeding on her clothes.  Patient Active Problem List  Diagnosis  . Prolapsed internal hemorrhoids  . Bladder prolapse, female, acquired  . Chronic kidney disease (CKD), stage III  (moderate)    Past Medical History  Diagnosis Date  . Prolapsed bladder   . Rectal prolapse   . Hyperlipidemia   . Chronic kidney disease (CKD), stage III (moderate) 06/27/2012  . Bladder prolapse, female, acquired 06/27/2012  . Prolapsed internal hemorrhoids 06/27/2012    Past Surgical History  Procedure Date  . Appendectomy   . Joint replacement     lt hip    History   Social History  . Marital Status: Widowed    Spouse Name: N/A    Number of Children: N/A  . Years of Education: N/A   Occupational History  . Not on file.   Social History Main Topics  . Smoking status: Never Smoker   . Smokeless tobacco: Not on file  . Alcohol Use: No  . Drug Use:   . Sexually Active:    Other Topics Concern  . Not on file   Social History Narrative  . No narrative on file    Family History  Problem Relation Age of Onset  . Cancer Sister     breast    Current Outpatient Prescriptions  Medication Sig Dispense Refill  . aspirin EC 81 MG tablet Take 81 mg by mouth daily.      Marland Kitchen docusate sodium (COLACE) 100 MG capsule Take 100 mg by mouth 2 (two) times daily.      . Multiple Vitamin (MULTIVITAMIN WITH MINERALS) TABS Take 1 tablet by mouth  daily.         No Known Allergies  BP 150/72  Pulse 72  Temp 97.8 F (36.6 C) (Temporal)  Resp 16  Ht 5' (1.524 m)  Wt 150 lb 3.2 oz (68.13 kg)  BMI 29.33 kg/m2  No results found.   Review of Systems  Constitutional: Negative for fever, chills, diaphoresis, appetite change and fatigue.  HENT: Positive for hearing loss. Negative for ear pain, sore throat, trouble swallowing, neck pain and ear discharge.   Eyes: Negative for photophobia, discharge and visual disturbance.  Respiratory: Negative for cough, choking, chest tightness and shortness of breath.   Cardiovascular: Positive for leg swelling. Negative for chest pain and palpitations.  Gastrointestinal: Positive for anal bleeding. Negative for nausea, vomiting, abdominal  pain, diarrhea, constipation and rectal pain.  Genitourinary: Positive for difficulty urinating. Negative for dysuria and frequency.  Musculoskeletal: Negative for myalgias and gait problem.  Skin: Negative for color change, pallor and rash.  Neurological: Negative for dizziness, speech difficulty, weakness and numbness.  Hematological: Negative for adenopathy.  Psychiatric/Behavioral: Positive for confusion. Negative for agitation. The patient is not nervous/anxious.        Objective:   Physical Exam  Constitutional: She is oriented to person, place, and time. She appears well-developed and well-nourished. No distress.  HENT:  Head: Normocephalic.  Mouth/Throat: Oropharynx is clear and moist. No oropharyngeal exudate.  Eyes: Conjunctivae and EOM are normal. Pupils are equal, round, and reactive to light. No scleral icterus.  Neck: Normal range of motion. Neck supple. No tracheal deviation present.  Cardiovascular: Normal rate, regular rhythm and intact distal pulses.   Pulmonary/Chest: Effort normal and breath sounds normal. No respiratory distress. She exhibits no tenderness.  Abdominal: Soft. She exhibits no distension and no mass. There is no tenderness. Hernia confirmed negative in the right inguinal area and confirmed negative in the left inguinal area.  Genitourinary:    There is no lesion on the right labia. There is no lesion on the left labia.       Exam done with assistance of female Medical Assistant in the room.  Perianal skin clean with good hygiene.  No pruritis.  No external skin tags / hemorrhoids of significance.  No pilonidal disease.  No fissure.  No abscess/fistula.    Tolerates digital and anoscopic rectal exam.  Normal sphincter tone.  No rectal masses.  Soft stool in rectal vault.  Hemorrhoidal piles enlarged R ant>L lat>R posterior  Musculoskeletal: Normal range of motion. She exhibits no tenderness.  Lymphadenopathy:    She has no cervical adenopathy.        Right: No inguinal adenopathy present.       Left: No inguinal adenopathy present.  Neurological: She is alert and oriented to person, place, and time. No cranial nerve deficit. She exhibits normal muscle tone. Coordination normal.  Skin: Skin is warm and dry. No rash noted. She is not diaphoretic. No erythema.  Psychiatric: She has a normal mood and affect. Her speech is normal and behavior is normal. Judgment and thought content normal. Her affect is not angry and not labile. She exhibits abnormal remote memory.       Assessment:     Prolapse of pelvis seems to be her bladder.  No strong evidence of rectal prolapse On exam today.  Prior records mention prolapsing tissue as rectal but at the same moment never mentioned the bladder prolapsing out.  I suspect that there is confusion on what anatomy is prolapsing the urologist  mention only prolapse of the bladder.  Inflamed hemorrhoids with possible intermittent prolapse from that.    Plan:     I suspect that there is been confusion.  The prolapse from the pelvis has always been the bladder.  It is always out.  I  am skeptical that she has true rectal proccidentia with whole circumferential prolapse.  That is almost always is associated with fecal incontinence a lot of mucousy drainage.  There is been no history of that.  I did not feel any redundant rectum.  It is usually not subtle.  She did not prolapse through the endoscope with Valsalva.  At the most, mild hemorrhoidal tissue prolapsed on the right anterior aspect.  I recommended that if she has another episode of rectal bleeding and pain that she call us for evaluation.  If she does have true procidentia that persists, she will require a rectopexy with probable rectosigmoid resection as well.  Would be best to get a colonoscopy preop but I suspect she will refuse.  I might be able to get away with getting an enema on the patient to make sure she does not have any severely redundant colon or any  obvious tumors.    Because I see inflamed hemorrhoids and that could benefit source of rectal bleeding, I offered to do banding to control that.  After discussion numerous times, the patient agreed to proceed:  The anatomy & physiology of the anorectal region was discussed.  The pathophysiology of hemorrhoids and differential diagnosis was discussed.  Natural history progression  was discussed.   I stressed the importance of a bowel regimen to have daily soft bowel movements to minimize progression of disease.     The patient's symptoms are not adequately controlled.  Therefore, I recommended banding to treat the hemorrhoids.  I went over the technique, risks, benefits, and alternatives.   Goals of post-operative recovery were discussed as well.  Questions were answered.  The patient expressed understanding & wished to proceed.  The patient was positioned in the lateral decubitus position.  Perianal & rectal examination was done.  Using anoscopy, I ligated the hemorrhoids above the dentate line with banding on all 3 piles.  The patient tolerated the procedure well.  Educational handouts further explaining the pathology, treatment options, and bowel regimen were given as well.   I recommended that she consider re\re discussing with urology about the severe bladder prolapse.  Her creatinine has elevated to 2.7 now.  At the very least needs to be followed.  Reconsider at least pessary to see if that helps control things.  Understandably, major pelvic floor reconstruction a 78 year old is not something that should be taken lightly.  However, if she has to rectal prolapse and may be wise to try and do a combined procedure.

## 2012-06-27 NOTE — Patient Instructions (Addendum)
Prolapse  Prolapse means the falling down, bulging, dropping, or drooping of a body part. Organs that commonly prolapse include the rectum, small intestine, bladder, urethra, vagina (birth canal), uterus (womb), and cervix. Prolapse occurs when the ligaments and muscle tissue around the rectum, bladder, and uterus are damaged or weakened.  CAUSES  This happens especially with:  Childbirth. Some women feel pelvic pressure or have trouble holding their urine right after childbirth, because of stretching and tearing of pelvic tissues. This generally gets better with time and the feeling usually goes away, but it may return with aging.   Chronic heavy lifting.   Aging.   Menopause, with loss of estrogen production weakening the pelvic ligaments and muscles.   Past pelvic surgery.   Obesity.   Chronic constipation.   Chronic cough.  Prolapse may affect a single organ, or several organs may prolapse at the same time. The front wall of the vagina holds up the bladder. The back wall holds up part of the lower intestine, or rectum. The uterus fills a spot in the middle. All these organs can be involved when the ligaments and muscles around the vagina relax too much. This often gets worse when women stop producing estrogen (menopause). SYMPTOMS  Uncontrolled loss of urine (incontinence) with cough, sneeze, straining, and exercise.   More force may be required to have a bowel movement, due to trapping of the stool.   When part of an organ bulges through the opening of the vagina, there is sometimes a feeling of heaviness or pressure. It may feel as though something is falling out. This sensation increases with coughing or bearing down.   If the organs protrude through the opening of the vagina and rub against the clothing, there may be soreness, ulcers, infection, pain, and bleeding.   Lower back pain.   Pushing in the upper or lower part of the vagina, to pass urine or have a bowel movement.     Problems having sexual intercourse.   Being unable to insert a tampon or applicator.  DIAGNOSIS  Usually, a physical exam is all that is needed to identify the problem. During the examination, you may be asked to cough and strain while lying down, sitting up, and standing up. Your caregiver will determine if more testing is required, such as bladder function tests. Some diagnoses are:  Cystocele: Bulging and falling of the bladder into the top of the vagina.   Rectocele: Part of the rectum bulging into the vagina.   Prolapse of the uterus: The uterus falls or drops into the vagina.   Enterocele: Bulging of the top of the vagina, after a hysterectomy (uterus removal), with the small intestine bulging into the vagina. A hernia in the top of the vagina.   Urethrocele: The urethra (urine carrying tube) bulging into the vagina.  TREATMENT  In most cases, prolapse needs to be treated only if it produces symptoms. If the symptoms are interfering with your usual daily or sexual activities, treatment may be necessary. The following are some measures that may be used to treat prolapse.  Estrogen may help elderly women with mild prolapse.   Kegel exercises may help mild cases of prolapse, by strengthening and tightening the muscles of the pelvic floor.   Pessaries are used in women who choose not to, or are unable to, have surgery. A pessary is a doughnut-shaped piece of plastic or rubber that is put into the vagina to keep the organs in place. This device must   be fitted by your caregiver. Your caregiver will also explain how to care for yourself with the pessary. If it works well for you, this may be the only treatment required.   Surgery is often the only form of treatment for more severe prolapses. There are different types of surgery available. You should discuss what the best procedure is for you. If the uterus is prolapsed, it may be removed (hysterectomy) as part of the surgical treatment.  Your caregiver will discuss the risks and benefits with you.   Uterine-vaginal suspension (surgery to hold up the organs) may be used, especially if you want to maintain your fertility.  No form of treatment is guaranteed to correct the prolapse or relieve the symptoms. HOME CARE INSTRUCTIONS   Wear a sanitary pad or absorbent product if you have incontinence of urine.   Avoid heavy lifting and straining with exercise and work.   Take over-the-counter pain medicine for minor discomfort.   Try taking estrogen or using estrogen vaginal cream.   Try Kegel exercises or use a pessary, before deciding to have surgery.   Do Kegel exercises after having a baby.  SEEK MEDICAL CARE IF:   Your symptoms interfere with your daily activities.   You need medicine to help with the discomfort.   You need to be fitted with a pessary.   You notice bleeding from the vagina.   You think you have ulcers or you notice ulcers on the cervix.   You have an oral temperature above 102 F (38.9 C).   You develop pain or blood with urination.   You have bleeding with a bowel movement.   The symptoms are interfering with your sex life.   You have urinary incontinence that interferes with your daily activities.   You lose urine with sexual intercourse.   You have a chronic cough.   You have chronic constipation.  Document Released: 05/21/2003 Document Revised: 11/03/2011 Document Reviewed: 11/29/2009 [facility] Patient Information 2012 [place], [place].  HEMORRHOIDS   The rectum is the last few inches of your colon, and it naturally stretches to hold stool.  Hemorrhoidal piles are natural clusters of blood vessels that help the rectum stretch to hold stool and allow bowel movements to eliminate feces.  Hemorrhoids are abnormally swollen blood vessels in the rectum.  Too much pressure in the rectum causes hemorrhoids by forcing blood to stretch and bulge the walls of the veins, sometimes even rupturing  them.  Hemorrhoids can become like varicose veins you might see on a person's legs. When bulging hemorrhoidal veins are irritated, they can swell, burn, itch, become very painful, and bleed. Once the rectal veins have been stretched out and hemorrhoids created, they are difficult to get rid of completely and tend to recur with less straining than it took to cause them in the first place. Fortunately, good habits and simple medical treatment usually control hemorrhoids well, and surgery is only recommended in unusually severe cases. Some of the most frequent causes of hemorrhoids:    Constant sitting    Straining with bowel movements (from constipation or hard stools)    Diarrhea    Sitting on the toilet for a long time    Severe coughing    Childbirth    Heavy Lifting  Types of Hemorrhoids:    Internal hemorrhoids usually don't hurt or itch; they are deep inside the rectum and usually have no sensation. However, internal hemorrhoids can bleed.  Such bleeding should not be ignored and  mask blood from a dangerous source like colorectal cancer, so persistent rectal bleeding should be investigated with a colonoscopy.    External hemorrhoids cause most of the symptoms - pain, burning, and itching. Unirritated hemorrhoids can look like small skin tags coming out of the anus.     Thrombosed hemorrhoids can form when a hemorrhoid blood vessel bursts and causes the hemorrhoid to swell.  A purple blood clot can form in it and become an excruciatingly painful lump at the anus. Because of these unpleasant symptoms, immediate incision and drainage by a surgeon at an office visit can provide much relief of the pain.    PREVENTION Avoiding the causes listed in above will prevent most cases of hemorrhoids, but this advice is sometimes hard to follow:  How can you avoid sitting all day if you have a seated job? Also, we try to avoid coughing and diarrhea, but sometimes it's beyond your control.  Still, there are some  practical hints to help:    If your main job activity is seated, always stand or walk during your breaks. Make it a point to stand and walk at least 5 minutes every hour and try to shift frequently in your chair to avoid direct rectal pressure.    Always exhale as you strain or lift. Don't hold your breath.    Treat coughing, diarrhea and constipation early since irritated hemorrhoids may soon follow.    Do not delay or try to prevent a bowel movement when the urge is present.   Exercise regularly (walking or jogging 60 minutes a day) to stimulate the bowels to move.   Avoid dry toilet paper when cleaning after bowel movements.  Moistened tissues such as baby wipes are less irritating.  Lightly pat the rectal area dry.  Using irrigating showers or bottle irrigation washing can more gently clean this sensitive area.   Keep the anal and genital area clean and  dry.  Talcum or baby powders can help   GET YOUR STOOLS SOFT.   This is the most important way to prevent irritated hemorrhoids.  Hard stools are like sandpaper to the anorectal canal and will cause more problems.   The goal: ONE SOFT BOWEL MOVEMENT A DAY!  To have soft, regular bowel movements:    Drink at least 8 tall glasses of water a day.     AVOID CONSTIPATION    Take plenty of fiber.  Fiber is the undigested part of plant food that passes into the colon, acting s "natures broom" to encourage bowel motility and movement.  Fiber can absorb and hold large amounts of water. This results in a larger, bulkier stool, which is soft and easier to pass. Work gradually over several weeks up to 6 servings a day of fiber (25g a day even more if needed) in the form of: o Vegetables -- Root (potatoes, carrots, turnips), leafy green (lettuce, salad greens, celery, spinach), or cooked high residue (cabbage, broccoli, etc) o Fruit -- Fresh (unpeeled skin & pulp), Dried (prunes, apricots, cherries, etc ),  or stewed ( applesauce)  o Whole grain breads, pasta,  etc (whole wheat)  o Bran cereals    Bulking Agents -- This type of water-retaining fiber generally is easily obtained each day by one of the following:  o Psyllium bran -- The psyllium plant is remarkable because its ground seeds can retain so much water. This product is available as Metamucil, Konsyl, Effersyllium, Per Diem Fiber, or the less expensive generic preparation  in drug and health food stores. Although labeled a laxative, it really is not a laxative.  o Methylcellulose -- This is another fiber derived from wood which also retains water. It is available as Citrucel. o Polyethylene Glycol - and "artificial" fiber commonly called Miralax or Glycolax.  It is helpful for people with gassy or bloated feelings with regular fiber o Flax Seed - a less gassy fiber than psyllium   No reading or other relaxing activity while on the toilet. If bowel movements take longer than 5 minutes, you are too constipated   Laxatives can be useful for a short period if constipation is severe o Osmotics (Milk of Magnesia, Fleets phosphosoda, Magnesium citrate, MiraLax, GoLytely) are safer than  o Stimulants (Senokot, Castor Oil, Dulcolax, Ex Lax)    o Do not take laxatives for more than 7days in a row.   Laxatives are not a good long-term solution as it can stress the intestine and colon and causes too much mineral and fluid losses.    If badly constipated, try a Bowel Retraining Program: o Do not use laxatives.  o Eat a diet high in roughage, such as bran cereals and leafy vegetables.  o Drink six (6) ounces of prune or apricot juice each morning.  o Eat two (2) large servings of stewed fruit each day.  o Take one (1) heaping dose of a bulking agent (ex. Metamucil, Citrucel, Miralax) twice a day.  o Use sugar-free sweetener when possible to avoid excessive calories.  o Eat a normal breakfast.  o Set aside 15 minutes after breakfast to sit on the toilet, but do not strain to have a bowel movement.  o If you  do not have a bowel movement by the third day, use an enema and repeat the above steps.    AVOID DIARRHEA o Switch to liquids and simpler foods for a few days to avoid stressing your intestines further. o Avoid dairy products (especially milk & ice cream) for a short time.  The intestines often can lose the ability to digest lactose when stressed. o Avoid foods that cause gassiness or bloating.  Typical foods include beans and other legumes, cabbage, broccoli, and dairy foods.  Every person has some sensitivity to other foods, so listen to our body and avoid those foods that trigger problems for you. o Adding fiber (Citrucel, Metamucil, psyllium, Miralax) gradually can help thicken stools by absorbing excess fluid and retrain the intestines to act more normally.  Slowly increase the dose over a few weeks.  Too much fiber too soon can backfire and cause cramping & bloating. o Probiotics (such as active yogurt, Align, etc) may help repopulate the intestines and colon with normal bacteria and calm down a sensitive digestive tract.  Most studies show it to be of mild help, though, and such products can be costly. o Medicines:   Bismuth subsalicylate (ex. Kayopectate, Pepto Bismol) every 30 minutes for up to 6 doses can help control diarrhea.  Avoid if pregnant.   Loperamide (Immodium) can slow down diarrhea.  Start with two tablets (4mg  total) first and then try one tablet every 6 hours.  Avoid if you are having fevers or severe pain.  If you are not better or start feeling worse, stop all medicines and call your doctor for advice o Call your doctor if you are getting worse or not better.  Sometimes further testing (cultures, endoscopy, X-ray studies, bloodwork, etc) may be needed to help diagnose and treat the cause  of the diarrhea.   If these preventive measures fail, you must take action right away! Hemorrhoids are one condition that can be mild in the morning and become intolerable by  nightfall.

## 2012-08-13 ENCOUNTER — Encounter (INDEPENDENT_AMBULATORY_CARE_PROVIDER_SITE_OTHER): Payer: Self-pay

## 2012-10-25 ENCOUNTER — Emergency Department (HOSPITAL_COMMUNITY)
Admission: EM | Admit: 2012-10-25 | Discharge: 2012-10-25 | Disposition: A | Discharge status: Home Health | Payer: Medicare Other | Attending: Emergency Medicine | Admitting: Emergency Medicine

## 2012-10-25 ENCOUNTER — Other Ambulatory Visit: Payer: Self-pay

## 2012-10-25 ENCOUNTER — Emergency Department (HOSPITAL_COMMUNITY): Payer: Medicare Other

## 2012-10-25 DIAGNOSIS — F0390 Unspecified dementia without behavioral disturbance: Secondary | ICD-10-CM

## 2012-10-25 DIAGNOSIS — Z79899 Other long term (current) drug therapy: Secondary | ICD-10-CM | POA: Insufficient documentation

## 2012-10-25 DIAGNOSIS — N183 Chronic kidney disease, stage 3 unspecified: Secondary | ICD-10-CM | POA: Insufficient documentation

## 2012-10-25 DIAGNOSIS — Z8719 Personal history of other diseases of the digestive system: Secondary | ICD-10-CM | POA: Insufficient documentation

## 2012-10-25 DIAGNOSIS — N390 Urinary tract infection, site not specified: Secondary | ICD-10-CM | POA: Insufficient documentation

## 2012-10-25 DIAGNOSIS — Z87448 Personal history of other diseases of urinary system: Secondary | ICD-10-CM | POA: Insufficient documentation

## 2012-10-25 DIAGNOSIS — Z7982 Long term (current) use of aspirin: Secondary | ICD-10-CM | POA: Insufficient documentation

## 2012-10-25 DIAGNOSIS — F411 Generalized anxiety disorder: Secondary | ICD-10-CM | POA: Insufficient documentation

## 2012-10-25 DIAGNOSIS — E785 Hyperlipidemia, unspecified: Secondary | ICD-10-CM | POA: Insufficient documentation

## 2012-10-25 DIAGNOSIS — F419 Anxiety disorder, unspecified: Secondary | ICD-10-CM

## 2012-10-25 LAB — URINALYSIS, ROUTINE W REFLEX MICROSCOPIC
Glucose, UA: NEGATIVE mg/dL
Protein, ur: NEGATIVE mg/dL
Specific Gravity, Urine: 1.008 (ref 1.005–1.030)
Urobilinogen, UA: 0.2 mg/dL (ref 0.0–1.0)

## 2012-10-25 LAB — CBC WITH DIFFERENTIAL/PLATELET
Basophils Absolute: 0 10*3/uL (ref 0.0–0.1)
Lymphocytes Relative: 20 % (ref 12–46)
Neutro Abs: 4.1 10*3/uL (ref 1.7–7.7)
Platelets: 262 10*3/uL (ref 150–400)
RBC: 3.3 MIL/uL — ABNORMAL LOW (ref 3.87–5.11)
RDW: 13.7 % (ref 11.5–15.5)
WBC: 5.9 10*3/uL (ref 4.0–10.5)

## 2012-10-25 LAB — TROPONIN I: Troponin I: <0.3 ng/mL (ref <0.30)

## 2012-10-25 LAB — BASIC METABOLIC PANEL
CO2: 24 mEq/L (ref 19–32)
Chloride: 106 mEq/L (ref 96–112)
Potassium: 5 mEq/L (ref 3.5–5.1)
Sodium: 141 mEq/L (ref 135–145)

## 2012-10-25 LAB — URINE MICROSCOPIC-ADD ON

## 2012-10-25 MED ORDER — SULFAMETHOXAZOLE-TRIMETHOPRIM 800-160 MG PO TABS: 1.0000 | ORAL_TABLET | Freq: Two times a day (BID) | ORAL | Status: DC

## 2012-10-25 NOTE — ED Notes (Signed)
Pt alert and oriented to place and situation on arrival. Denies any pain or shob states that her heart is not "fluttering" any longer. Family members x 2 at bedside

## 2012-10-25 NOTE — ED Provider Notes (Signed)
History     CSN: [id number]  Arrival date & time 10/25/12  0272   First MD Initiated Contact with Patient 10/25/12 0421      Chief Complaint  Patient presents with  . Palpitations  level 5 caveat due to dementia.  (Consider location/radiation/quality/duration/timing/severity/associated sxs/prior treatment) Patient is a 78 y.o. female presenting with palpitations. The history is provided by the patient.  Palpitations    patient presents after waking up tonight feeling bad. Patient states that she does not feel right. Family member states they called her and stated that she was having a heart attack and was going to die. Patient states she feels better now. She's been having increasing episodes of anxiety recently. There's been some change in her medications for it. No fevers. She has been eating less. Family states they were told that the medications may cause anxiety and decreased oral intake but that should resolve. Patient also has a chronic urinary tract infection due to prolapsed bladder. No fevers.  Past Medical History  Diagnosis Date  . Prolapsed bladder   . Rectal prolapse   . Hyperlipidemia   . Chronic kidney disease (CKD), stage III (moderate) 06/27/2012  . Bladder prolapse, female, acquired 06/27/2012  . Prolapsed internal hemorrhoids 06/27/2012    Past Surgical History  Procedure Date  . Appendectomy   . Joint replacement     lt hip    Family History  Problem Relation Age of Onset  . Cancer Sister     breast    History  Substance Use Topics  . Smoking status: Never Smoker   . Smokeless tobacco: Not on file  . Alcohol Use: No    OB History    Grav Para Term Preterm Abortions TAB SAB Ect Mult Living                  Review of Systems  Unable to perform ROS: Dementia  Cardiovascular: Positive for palpitations.    Allergies  Review of patient's allergies indicates no known allergies.  Home Medications   Current Outpatient Rx  Name  Route  Sig   Dispense  Refill  . ASPIRIN EC 81 MG PO TBEC   Oral   Take 81 mg by mouth daily.         Marland Kitchen DOCUSATE SODIUM 100 MG PO CAPS   Oral   Take 100 mg by mouth 2 (two) times daily.         . DONEPEZIL HCL 10 MG PO TABS   Oral   Take 10 mg by mouth daily.         . ERGOCALCIFEROL 50000 UNITS PO CAPS   Oral   Take 50,000 Units by mouth daily.         Marland Kitchen NAMENDA PO   Oral   Take 28 mg by mouth daily.         . ADULT MULTIVITAMIN W/MINERALS CH   Oral   Take 1 tablet by mouth daily.         . SULFAMETHOXAZOLE-TRIMETHOPRIM 800-160 MG PO TABS   Oral   Take 1 tablet by mouth 2 (two) times daily.   6 tablet   0     BP 130/69  Pulse 77  Temp 97.6 F (36.4 C) (Oral)  Resp 22  SpO2 94%  Physical Exam  Constitutional: She appears well-developed.  HENT:  Head: Normocephalic and atraumatic.  Cardiovascular: Normal rate and regular rhythm.   Pulmonary/Chest: Effort normal and breath sounds normal.  Abdominal: Soft. There is no tenderness.  Musculoskeletal: Normal range of motion.  Neurological: She is alert.       Patient is awake and appears at baseline per family. Some mild dementia.  Skin: Skin is warm.    ED Course  Procedures (including critical care time)  Labs Reviewed  URINALYSIS, ROUTINE W REFLEX MICROSCOPIC - Abnormal; Notable for the following:    APPearance CLOUDY (*)     Hgb urine dipstick MODERATE (*)     Nitrite POSITIVE (*)     Leukocytes, UA LARGE (*)     All other components within normal limits  CBC WITH DIFFERENTIAL - Abnormal; Notable for the following:    RBC 3.30 (*)     Hemoglobin 9.9 (*)     HCT 30.7 (*)     All other components within normal limits  BASIC METABOLIC PANEL - Abnormal; Notable for the following:    Glucose, Bld 102 (*)     BUN 61 (*)     Creatinine, Ser 2.18 (*)     GFR calc non Af Amer 19 (*)     GFR calc Af Amer 22 (*)     All other components within normal limits  URINE MICROSCOPIC-ADD ON - Abnormal; Notable for  the following:    Squamous Epithelial / LPF FEW (*)     Bacteria, UA MANY (*)     All other components within normal limits  TROPONIN I  URINE CULTURE   Dg Chest 2 View  10/25/2012  *RADIOLOGY REPORT*  Clinical Data: Chest pain.  CHEST - 2 VIEW  Comparison: Chest radiograph performed 07/11/2004  Findings: The lungs are well-aerated and clear.  There is no evidence of focal opacification, pleural effusion or pneumothorax.  The heart is normal in size; the mediastinal contour is within normal limits.  No acute osseous abnormalities are seen.  IMPRESSION: No acute cardiopulmonary process seen; no displaced rib fractures identified.   Original Report Authenticated By: Tonia Ghent, M.D.      1. Anxiety   2. Dementia   3. UTI (urinary tract infection)      Date: 10/25/2012  Rate: 88  Rhythm: normal sinus rhythm  QRS Axis: normal  Intervals: PR prolonged  ST/T Wave abnormalities: normal  Conduction Disutrbances:none  Narrative Interpretation:   Old EKG Reviewed: none available    MDM  Patient woke up and felt anxious. EKG and labwork is reassuring. I doubt cardiac cause. Patient does have a history of severe rectal and bladder prolapse. She is reportedly not a surgical candidate. Patient's son is concerned about the patient living at home, however she does not appear to be enough of a risk to herself that she would need involuntary commitment. She may need more help at home or in the son would like her to live with him. The patient however does not want to live with him. Patient does have what appears to be urinary tract infection. She reportedly always has this. The fact that she's been eating and drinking less and was more anxiety recently she will be treated. Cultures were sent. This also could be related to worsening of the dementia or related to some of her new medications.         Juliet Rude. Rubin Payor, MD 10/25/12 [phone]

## 2012-10-25 NOTE — ED Notes (Signed)
Discharge papers given to pt family member.  Pt pants are wet with urine.  Family refuses to take pt home in paper scrubs or gown.  Family gone home to get pt pants and jacket.  Pt is alert and oriented to place. Denies pain. VSS

## 2012-10-25 NOTE — ED Notes (Signed)
Pt states when she woke up she felt like her heart was beating faster than normal.  Son states that she has benn more anxious lately

## 2012-10-25 NOTE — ED Notes (Signed)
Assisted pt with Bedpan. Pt states she feels like her bladder is falling out.  Pt has lage bulge to perineal area, easily reduced. Pt denies pain but urine is strong smelling UA obtained and scent

## 2012-10-25 NOTE — ED Notes (Signed)
Family at bedside. Pt resting quielty

## 2012-10-27 LAB — URINE CULTURE: Colony Count: >=100000

## 2012-10-28 NOTE — ED Notes (Signed)
+  Urine. Patient treated with Bactrim. Sensitive to same. Per protocol MD. °

## 2012-11-06 ENCOUNTER — Other Ambulatory Visit: Payer: Self-pay | Admitting: Internal Medicine

## 2012-11-06 DIAGNOSIS — N1330 Unspecified hydronephrosis: Secondary | ICD-10-CM

## 2012-11-07 ENCOUNTER — Ambulatory Visit
Admission: RE | Admit: 2012-11-07 | Discharge: 2012-11-07 | Disposition: A | Discharge status: Home / Self Care | Payer: Medicare Other | Source: Ambulatory Visit | Attending: Internal Medicine | Admitting: Internal Medicine

## 2012-11-07 DIAGNOSIS — N1330 Unspecified hydronephrosis: Secondary | ICD-10-CM

## 2012-11-21 ENCOUNTER — Encounter (HOSPITAL_COMMUNITY): Payer: Self-pay | Admitting: *Deleted

## 2012-11-21 ENCOUNTER — Inpatient Hospital Stay (HOSPITAL_COMMUNITY)
Admission: EM | Admit: 2012-11-21 | Discharge: 2012-11-23 | DRG: 394 | Disposition: A | Discharge status: Home / Self Care | Payer: Medicare Other | Attending: Surgery | Admitting: Surgery

## 2012-11-21 DIAGNOSIS — K625 Hemorrhage of anus and rectum: Secondary | ICD-10-CM | POA: Diagnosis present

## 2012-11-21 DIAGNOSIS — K620 Anal polyp: Principal | ICD-10-CM | POA: Diagnosis present

## 2012-11-21 DIAGNOSIS — K922 Gastrointestinal hemorrhage, unspecified: Secondary | ICD-10-CM

## 2012-11-21 DIAGNOSIS — K5730 Diverticulosis of large intestine without perforation or abscess without bleeding: Secondary | ICD-10-CM | POA: Diagnosis present

## 2012-11-21 LAB — CBC
Hemoglobin: 10.7 g/dL — ABNORMAL LOW (ref 12.0–15.0)
MCH: 30.7 pg (ref 26.0–34.0)
MCV: 93.7 fL (ref 78.0–100.0)
RBC: 3.49 MIL/uL — ABNORMAL LOW (ref 3.87–5.11)
WBC: 5.9 10*3/uL (ref 4.0–10.5)

## 2012-11-21 LAB — BASIC METABOLIC PANEL
CO2: 23 mEq/L (ref 19–32)
Calcium: 9.5 mg/dL (ref 8.4–10.5)
Chloride: 105 mEq/L (ref 96–112)
Glucose, Bld: 105 mg/dL — ABNORMAL HIGH (ref 70–99)
Sodium: 136 mEq/L (ref 135–145)

## 2012-11-21 MED ORDER — HYDROCODONE-ACETAMINOPHEN 5-325 MG PO TABS: 1.0000 | ORAL_TABLET | ORAL | Status: DC | PRN

## 2012-11-21 MED ORDER — PEG 3350-KCL-NA BICARB-NACL 420 G PO SOLR
4000.0000 mL | Freq: Once | ORAL | Status: AC
  Administered 2012-11-21: 4000 mL via ORAL
  Filled 2012-11-21: qty 4000

## 2012-11-21 MED ORDER — KCL IN DEXTROSE-NACL 20-5-0.9 MEQ/L-%-% IV SOLN
INTRAVENOUS | Status: DC
  Administered 2012-11-21: 09:00:00 via INTRAVENOUS
  Filled 2012-11-21 (×5): qty 1000

## 2012-11-21 MED ORDER — ONDANSETRON HCL 4 MG/2ML IJ SOLN: 4.0000 mg | Freq: Four times a day (QID) | INTRAMUSCULAR | Status: DC | PRN

## 2012-11-21 NOTE — ED Provider Notes (Signed)
She reports she saw Dr. gross about 3 months ago and had a band placed on a prolapsed hemorrhoid. Son states she was initially diagnosed with a prolapsed rectum She reports yesterday evening she started getting rectal pain and having rectal bleeding.  Patient has large plum-sized mass outside her rectal area that does not appear to be rectal tissue, however it is not classical appearance of a hemorrhoid. It is intensely red and oozing blood with irregular surface.   07:00 Dr Johna Sheriff will see patient.   Medical screening examination/treatment/procedure(s) were conducted as a shared visit with non-physician practitioner(s) and myself.  I personally evaluated the patient during the encounter Devoria Albe, MD, Franz Dell, MD 11/21/12 [phone]

## 2012-11-21 NOTE — Progress Notes (Deleted)
Scab noted to right elbow area, no drainage noted.  Bruise noted to left a/c area.   Blisters noted to left inner thigh.

## 2012-11-21 NOTE — ED Notes (Addendum)
Per patient son, patient has hx of severe hemorrhoids- has had surgery by a rectal specialist. Patient takes a stool softener once daily. Patient had a BM on 11/20/12 (?) that required her to strain. Patient reports that she had blood in the toilet when she used the bathroom this morning, on 2 (?) separate occasions this am. Patient reports pain in her "bottom". Patient baseline mental status to person, place, situation. Patient typically disoriented to time, has some short term memory retention problems. VSS at this. Patient unable to rate on 0-10 scale, est pain 8/10 on faces scale.

## 2012-11-21 NOTE — ED Notes (Signed)
Report given to kirstin, rn

## 2012-11-21 NOTE — ED Notes (Signed)
Patient son states patient has a prolapsed bladder, they have been informed that there is potential for bleeding associated with this. Patient seems confident that it is her intestines or hemorrhoids.

## 2012-11-21 NOTE — ED Notes (Signed)
Thigpen, rn attempted to call report. Floor busy at moment

## 2012-11-21 NOTE — ED Provider Notes (Signed)
See prior note   Ward Givens, MD 11/21/12 [phone]

## 2012-11-21 NOTE — ED Provider Notes (Signed)
History     CSN: [id number]  Arrival date & time 11/21/12  0458   First MD Initiated Contact with Patient 11/21/12 [phone]      Chief Complaint  Patient presents with  . Rectal Bleeding    (Consider location/radiation/quality/duration/timing/severity/associated sxs/prior treatment) HPI Comments: 78 year old female brought in to the emergency department by her son after she called him at 4:20 this morning stating she was bleeding from her rectum. States she is certain this is coming from her hemorrhoids. She was diagnosed in the past with rectal prolapse, however her son states this was a misdiagnosis and after seeing a rectal specialist she was told it was a hemorrhoid. She had band placement by Dr. Michaell Cowing to fix the hemorrhoid a few months ago. Patient also has a history of bladder prolapse which has been getting worse, however patient states she is not bleeding from that area below and it is definitely her rectum. Describes the blood as bright red with a lot of blood. Denies any other symptoms at this time. Denies weakness, fatigue, lightheadedness, dizziness, chest pain or shortness of breath. No abdominal pain, nausea or vomiting. She continues to say "it is just my hemorrhoid and my butt hurts".  Patient is a 78 y.o. female presenting with hematochezia. The history is provided by the patient and a relative.  Rectal Bleeding  Associated symptoms include rectal pain. Pertinent negatives include no abdominal pain.    Past Medical History  Diagnosis Date  . Prolapsed bladder   . Rectal prolapse   . Hyperlipidemia   . Chronic kidney disease (CKD), stage III (moderate) 06/27/2012  . Bladder prolapse, female, acquired 06/27/2012  . Prolapsed internal hemorrhoids 06/27/2012    Past Surgical History  Procedure Date  . Appendectomy   . Joint replacement     lt hip    Family History  Problem Relation Age of Onset  . Cancer Sister     breast    History  Substance Use Topics  .  Smoking status: Never Smoker   . Smokeless tobacco: Not on file  . Alcohol Use: No    OB History    Grav Para Term Preterm Abortions TAB SAB Ect Mult Living                  Review of Systems  Gastrointestinal: Positive for hematochezia, anal bleeding and rectal pain. Negative for abdominal pain.  All other systems reviewed and are negative.    Allergies  Review of patient's allergies indicates no known allergies.  Home Medications   Current Outpatient Rx  Name  Route  Sig  Dispense  Refill  . ASPIRIN EC 81 MG PO TBEC   Oral   Take 81 mg by mouth daily.         Marland Kitchen VITAMIN D3 5000 UNITS PO TABS   Oral   Take 1 tablet by mouth daily.         Marland Kitchen DOCUSATE SODIUM 100 MG PO CAPS   Oral   Take 100 mg by mouth daily.          . ADULT MULTIVITAMIN W/MINERALS CH   Oral   Take 1 tablet by mouth daily.         . SULFAMETHOXAZOLE-TRIMETHOPRIM 800-160 MG PO TABS   Oral   Take 1 tablet by mouth 2 (two) times daily.   6 tablet   0     BP 123/64  Pulse 82  Temp 97.7 F (36.5 C) (Oral)  Resp  20  SpO2 98%  Physical Exam  Nursing note and vitals reviewed. Constitutional: She is oriented to person, place, and time. She appears well-developed and well-nourished. No distress.  HENT:  Head: Normocephalic and atraumatic.  Mouth/Throat: Oropharynx is clear and moist. Mucous membranes are not pale.  Eyes: Conjunctivae normal and EOM are normal. Pupils are equal, round, and reactive to light.  Neck: Normal range of motion. Neck supple.  Cardiovascular: Regular rhythm, normal heart sounds and intact distal pulses.  Tachycardia present.   Pulmonary/Chest: Effort normal and breath sounds normal.  Abdominal: Normal appearance and bowel sounds are normal. She exhibits no mass. There is no tenderness.  Genitourinary: Rectal exam shows mass (2 cm diameter round bumpy mass protruding from anus with clots of bright and dark red blood) and tenderness. Guaiac positive stool.   Musculoskeletal: Normal range of motion.  Neurological: She is alert and oriented to person, place, and time.  Skin: Skin is warm and dry. No pallor.  Psychiatric: She has a normal mood and affect. Her behavior is normal.    ED Course  Procedures (including critical care time)  Labs Reviewed  CBC - Abnormal; Notable for the following:    RBC 3.49 (*)     Hemoglobin 10.7 (*)     HCT 32.7 (*)     All other components within normal limits  BASIC METABOLIC PANEL - Abnormal; Notable for the following:    Glucose, Bld 105 (*)     BUN 57 (*)     Creatinine, Ser 2.46 (*)     GFR calc non Af Amer 16 (*)     GFR calc Af Amer 19 (*)     All other components within normal limits  OCCULT BLOOD, POC DEVICE - Abnormal; Notable for the following:    Fecal Occult Bld POSITIVE (*)     All other components within normal limits   No results found.   1. Rectal bleeding       MDM  78 y/o female with history of hemorrhoid repair presenting with rectal pain and bleeding. She is in NAD. Plum-sized mass with bumps protruding from rectum. Mass not consistent with rectal prolapse and also does not look like hemorrhoid. Actively bleeding with dark and bright red clots. No other symptoms present. Case discussed with Dr. Lynelle Doctor who also evaluated patient. She has consulted surgery Dr. Johna Sheriff who will come evaluate patient. Patient admitted.        Trevor Mace, PA-C 11/21/12 [phone]

## 2012-11-21 NOTE — H&P (Signed)
[name] is an 78 y.o. female.   Chief Complaint:Rectal bleeding and pain  HPI: patient is a generally quite healthy 78 year old female who presents to the emergency room with rectal pain and bleeding. She has a history of a couple of presentations to the emergency room this past year with tissue protruding from the rectum and bleeding which was reduced in the emergency room and was felt to be rectal prolapse. She was evaluated in our office and on careful exam did not have evidence of rectal prolapse but did have some internal hemorrhoids and had an internal hemorrhoid banded. She also has chronic known bladder prolapse per vagina. She has some constipation.  She has never had a colonoscopy. While straining at a stool last night she developed some bleeding and again pain at her rectum which has persisted and she presents to the emergency room.  Past Medical History  Diagnosis Date  . Prolapsed bladder   . Rectal prolapse   . Hyperlipidemia   . Chronic kidney disease (CKD), stage III (moderate) 06/27/2012  . Bladder prolapse, female, acquired 06/27/2012  . Prolapsed internal hemorrhoids 06/27/2012    Past Surgical History  Procedure Date  . Appendectomy   . Joint replacement     lt hip    Family History  Problem Relation Age of Onset  . Cancer Sister     breast   Social History:  reports that she has never smoked. She does not have any smokeless tobacco history on file. She reports that she does not drink alcohol. Her drug history not on file.  Allergies: No Known Allergies   (Not in a hospital admission)  Results for orders placed during the hospital encounter of 11/21/12 (from the past 48 hour(s))  OCCULT BLOOD, POC DEVICE     Status: Abnormal   Collection Time   11/21/12  6:15 AM      Component Value Range Comment   Fecal Occult Bld POSITIVE (*) NEGATIVE   CBC     Status: Abnormal   Collection Time   11/21/12  6:40 AM      Component Value Range Comment   WBC 5.9  4.0 -  10.5 K/uL    RBC 3.49 (*) 3.87 - 5.11 MIL/uL    Hemoglobin 10.7 (*) 12.0 - 15.0 g/dL    HCT 16.1 (*) 09.6 - 46.0 %    MCV 93.7  78.0 - 100.0 fL    MCH 30.7  26.0 - 34.0 pg    MCHC 32.7  30.0 - 36.0 g/dL    RDW 04.5  40.9 - 81.1 %    Platelets 251  150 - 400 K/uL   BASIC METABOLIC PANEL     Status: Abnormal   Collection Time   11/21/12  6:40 AM      Component Value Range Comment   Sodium 136  135 - 145 mEq/L    Potassium 4.7  3.5 - 5.1 mEq/L    Chloride 105  96 - 112 mEq/L    CO2 23  19 - 32 mEq/L    Glucose, Bld 105 (*) 70 - 99 mg/dL    BUN 57 (*) 6 - 23 mg/dL    Creatinine, Ser 9.14 (*) 0.50 - 1.10 mg/dL    Calcium 9.5  8.4 - 78.2 mg/dL    GFR calc non Af Amer 16 (*) >90 mL/min    GFR calc Af Amer 19 (*) >90 mL/min    No results found.  Review of Systems  Constitutional: Negative.   Eyes: Positive for blurred vision.  Respiratory: Negative.   Cardiovascular: Negative.   Gastrointestinal: Positive for blood in stool.  Genitourinary: Positive for frequency.  Musculoskeletal: Positive for myalgias and joint pain.  Neurological: Negative.   Psychiatric/Behavioral: Positive for memory loss.    Blood pressure 122/66, pulse 82, temperature 97.7 F (36.5 C), temperature source Oral, resp. rate 20, SpO2 98.00%. Physical Exam  General: Alert pleasant elderly Caucasian female Skin: Warm and dry without rash or infection HEENT: No palpable mass or thyromegaly. Sclera nonicteric. Pupils are graham cracker. Lungs: Clear equal breath sounds bilaterally Cardiac: Regular rate and rhythm without murmurs. No edema. Abdomen: Soft and nontender. Nondistended. No masses or organomegaly Rectal: Protruding from the anus is approximately 4 cm mass with multiple fronds and mucus and slight bleeding. I was able to pass my finger passed this into a normal feeling anal canal. I was unable to reduce this mass which digitally feels to be attached to a relatively narrow stalk in the rectum. Once  reduced the anus is normal with normal tone and anoderm and no external hemorrhoids and no palpable masses in the anal canal. Extremities: No swelling or deformity or cyanosis Neurologic: Mild confusion to details of recent events. No gross motor or sensory deficits.  Assessment/Plan Prolapsing rectal mass which is now reduced. This appears to be a large rectal polyp or villous adenoma that is mobile and on a stalk by palpation. I believe this is what has been seen on recent trip to the emergency room rather than true rectal prolapse. Once reduced her anal rectal exam is unremarkable. I discussed options with the patient and her son who is her chief caregiver. She lives alone but requires a lot of supervision. He is concerned she would not return for treatment and also we discussed that this is likely to recur at any time. We'll therefore plan to admit the patient and proceed with exam under anesthesia and probable transanal excision of a rectal mass. I discussed the indications for the procedure risks of general anesthetic, bleeding infection, and expected recovery and possible diagnoses including malignancy with the patient and her son.  Chapman Matteucci T 11/21/2012, 7:40 AM

## 2012-11-22 ENCOUNTER — Encounter (HOSPITAL_COMMUNITY): Payer: Self-pay | Admitting: Anesthesiology

## 2012-11-22 ENCOUNTER — Encounter (HOSPITAL_COMMUNITY): Admission: EM | Disposition: A | Discharge status: Home / Self Care | Payer: Self-pay | Source: Home / Self Care

## 2012-11-22 ENCOUNTER — Inpatient Hospital Stay (HOSPITAL_COMMUNITY): Payer: Medicare Other | Admitting: Anesthesiology

## 2012-11-22 DIAGNOSIS — D378 Neoplasm of uncertain behavior of other specified digestive organs: Secondary | ICD-10-CM

## 2012-11-22 DIAGNOSIS — D375 Neoplasm of uncertain behavior of rectum: Secondary | ICD-10-CM

## 2012-11-22 DIAGNOSIS — D371 Neoplasm of uncertain behavior of stomach: Secondary | ICD-10-CM

## 2012-11-22 HISTORY — PX: POLYPECTOMY: SHX5525

## 2012-11-22 HISTORY — PX: COLONOSCOPY: SHX5424

## 2012-11-22 LAB — SURGICAL PCR SCREEN: MRSA, PCR: NEGATIVE

## 2012-11-22 SURGERY — COLONOSCOPY
Anesthesia: General | Site: Rectum | Wound class: Dirty or Infected

## 2012-11-22 MED ORDER — EPHEDRINE SULFATE 50 MG/ML IJ SOLN
INTRAMUSCULAR | Status: DC | PRN
  Administered 2012-11-22: 5 mg via INTRAVENOUS

## 2012-11-22 MED ORDER — PROPOFOL 10 MG/ML IV BOLUS
INTRAVENOUS | Status: DC | PRN
  Administered 2012-11-22: 100 mg via INTRAVENOUS

## 2012-11-22 MED ORDER — FENTANYL CITRATE 0.05 MG/ML IJ SOLN
25.0000 ug | INTRAMUSCULAR | Status: DC | PRN
  Administered 2012-11-22: 25 ug via INTRAVENOUS

## 2012-11-22 MED ORDER — PROMETHAZINE HCL 25 MG/ML IJ SOLN: 6.2500 mg | INTRAMUSCULAR | Status: DC | PRN

## 2012-11-22 MED ORDER — LIDOCAINE HCL (CARDIAC) 20 MG/ML IV SOLN
INTRAVENOUS | Status: DC | PRN
  Administered 2012-11-22: 50 mg via INTRAVENOUS

## 2012-11-22 MED ORDER — FENTANYL CITRATE 0.05 MG/ML IJ SOLN
INTRAMUSCULAR | Status: DC | PRN
  Administered 2012-11-22: 25 ug via INTRAVENOUS

## 2012-11-22 MED ORDER — LACTATED RINGERS IV SOLN
INTRAVENOUS | Status: AC
  Administered 2012-11-22: 1000 mL via INTRAVENOUS
  Administered 2012-11-22 – 2012-11-23 (×3): via INTRAVENOUS

## 2012-11-22 MED ORDER — DEXTROSE 5 % IV SOLN
1.0000 g | INTRAVENOUS | Status: DC | PRN
  Administered 2012-11-22: 1 g via INTRAVENOUS

## 2012-11-22 SURGICAL SUPPLY — 39 items
BLADE HEX COATED 2.75 (ELECTRODE) ×4 IMPLANT
BLADE SURG 15 STRL LF DISP TIS (BLADE) ×3 IMPLANT
BLADE SURG 15 STRL SS (BLADE) ×1
BRIEF STRETCH FOR OB PAD LRG (UNDERPADS AND DIAPERS) IMPLANT
CANISTER SUCTION 2500CC (MISCELLANEOUS) IMPLANT
CLOTH BEACON ORANGE TIMEOUT ST (SAFETY) ×4 IMPLANT
COVER MAYO STAND STRL (DRAPES) IMPLANT
DECANTER SPIKE VIAL GLASS SM (MISCELLANEOUS) IMPLANT
DRAPE LG THREE QUARTER DISP (DRAPES) ×4 IMPLANT
DRSG PAD ABDOMINAL 8X10 ST (GAUZE/BANDAGES/DRESSINGS) IMPLANT
ELECT REM PT RETURN 9FT ADLT (ELECTROSURGICAL)
ELECTRODE REM PT RTRN 9FT ADLT (ELECTROSURGICAL) IMPLANT
GAUZE SPONGE 4X4 16PLY XRAY LF (GAUZE/BANDAGES/DRESSINGS) ×4 IMPLANT
GLOVE BIOGEL PI IND STRL 7.0 (GLOVE) ×3 IMPLANT
GLOVE BIOGEL PI IND STRL 7.5 (GLOVE) ×3 IMPLANT
GLOVE BIOGEL PI INDICATOR 7.0 (GLOVE) ×1
GLOVE BIOGEL PI INDICATOR 7.5 (GLOVE) ×1
GLOVE SURG SIGNA 7.5 PF LTX (GLOVE) ×4 IMPLANT
GLOVE SURG SS PI 7.5 STRL IVOR (GLOVE) ×4 IMPLANT
GOWN STRL NON-REIN LRG LVL3 (GOWN DISPOSABLE) IMPLANT
GOWN STRL REIN XL XLG (GOWN DISPOSABLE) ×12 IMPLANT
HEMOSTAT SNOW SURGICEL 2X4 (HEMOSTASIS) IMPLANT
KIT BASIN OR (CUSTOM PROCEDURE TRAY) ×4 IMPLANT
LUBRICANT JELLY K Y 4OZ (MISCELLANEOUS) ×4 IMPLANT
NDL SAFETY ECLIPSE 18X1.5 (NEEDLE) IMPLANT
NEEDLE HYPO 18GX1.5 SHARP (NEEDLE)
NEEDLE HYPO 25X1 1.5 SAFETY (NEEDLE) IMPLANT
NS IRRIG 1000ML POUR BTL (IV SOLUTION) IMPLANT
PACK LITHOTOMY IV (CUSTOM PROCEDURE TRAY) ×4 IMPLANT
PENCIL BUTTON HOLSTER BLD 10FT (ELECTRODE) ×4 IMPLANT
SPONGE GAUZE 4X4 12PLY (GAUZE/BANDAGES/DRESSINGS) ×4 IMPLANT
SPONGE SURGIFOAM ABS GEL 100 (HEMOSTASIS) IMPLANT
SUT CHROMIC 2 0 SH (SUTURE) IMPLANT
SUT CHROMIC 3 0 SH 27 (SUTURE) IMPLANT
SUT VIC AB 2-0 SH 18 (SUTURE) IMPLANT
SYR CONTROL 10ML LL (SYRINGE) IMPLANT
TOWEL OR 17X26 10 PK STRL BLUE (TOWEL DISPOSABLE) ×4 IMPLANT
TOWEL OR NON WOVEN STRL DISP B (DISPOSABLE) ×4 IMPLANT
YANKAUER SUCT BULB TIP 10FT TU (MISCELLANEOUS) ×4 IMPLANT

## 2012-11-22 NOTE — Transfer of Care (Signed)
Immediate Anesthesia Transfer of Care Note  Patient: [name]  Procedure(s) Performed: Procedure(s) (LRB): COLONOSCOPY (N/A) TRANSANAL EXCISION OF RECTAL MASS (N/A)  Patient Location: PACU  Anesthesia Type: General  Level of Consciousness: sedated, patient cooperative and responds to stimulaton  Airway & Oxygen Therapy: Patient Spontanous Breathing and Patient connected to face mask oxgen  Post-op Assessment: Report given to PACU RN and Post -op Vital signs reviewed and stable  Post vital signs: Reviewed and stable  Complications: No apparent anesthesia complications '

## 2012-11-22 NOTE — Progress Notes (Signed)
Patient in the room by herself.  She is widowed, lives at home by herself, has one son, Leonette Most.  I reviewed planned surgery.  The plan is to excise the rectal polyp and do a colonoscopy.  She also has a prolapsed uterus that I will not address with this surgery.  Risks include bleeding, incomplete removal of tumor, nerve injury, and infection.  She has done 3/4 mechanical bowel prep.  Ovidio Kin, MD, [facility] Surgery Pager: [phone] Office phone:  [phone]

## 2012-11-22 NOTE — Op Note (Signed)
11/21/2012 - 11/22/2012  3:20 PM  PATIENT:  [name], 78 y.o., female, MRN: [id number]  PREOP DIAGNOSIS:  rectal polyp, prolapsed uterus  POSTOP DIAGNOSIS:   Sessile lesions at 80 cm (not biopsied), rectal polyp at 15 cm (path pending), prolapsed uterus, moderate sigmoid diverticulosis.  PROCEDURE:   Procedure(s):  COLONOSCOPY with polypectomy of rectal polyp (90+% removed)  SURGEON:   Ovidio Kin, M.D.  ASSISTANT:  A. Maisie Fus, M.D.   ANESTHESIA:   general  Doran Clay, CRNA - CRNA Eilene Ghazi, MD - Anesthesiologist Paris Lore, CRNA - CRNA  General   SPECIMEN:   Rectal polyp  INDICATIONS FOR PROCEDURE:  [name] is a 78 y.o. (DOB: 1919/11/02) white female whose primary care physician is Oneal Grout, MD and comes for evaluation of rectal polyp.   The indications and risks of colonoscopy were explained to the patient.  The risks include, but are not limited to, perforation of the bowel and bleeding.  OPERATIVE NOTE:  The patient was taken to room # 1 in the [facility] OR.  She had a general anesthetic supervised by Dr. Dot Lanes.  She was in a lithotomy position.  A time out was held and the checklist reviewed.   A digital rectal exam was done at the beginning of the procedure..  She had a scarred nodule felt at the 6 o'clock position at the anus.  I could not feel the polyp that Dr. Johna Sheriff described.     The flexible Pentax colonoscope was passed up the rectum without difficulty.  The scope was advanced to the right colon (140 cm), but I did not identify the ileocecal valve.  The colonic prep was moderate.  She had a lot of thin stool, but the colon wall was fairly well seen.   The right colon and transverse colon were unremarkable.   At 80 cm she had a flat pink polyp that occupied about 20% of the colon wall.  Photos were taken.  But this polyp lay behind a fold and when I tried to biopsy it, I could not find it again.  Despite 3 passes, I could not locate the polyp  again..  The scope was drawn into the sigmoid colon where she had moderate diverticulosis.   In the rectum at about 15 - 17 cm there was a 4 cm polyp dominating the lumen.  This was removed piecemeal.  Dr. Maisie Fus assisted me in the removal.  It appears benign, but the pieces were sent to pathology.   The scope was withdrawn into the rectum and retroflexed.  The rectum was unremarkable.  Photos were taken during the procedure.   I spent 1 hour and 45 minutes doing this procedure.  The difficulties were in trying to relocate the sessile polyp, which I could not do, and taking out the large rectal polyp piecemeal.   She'll be kept overnight after this procedure and discharged home tomorrow.   The patient's next colonoscopy will depend on the conversation with the son.  The pathology will drive part of the decision making.  And the need to re-look at the stump of the rectal polyp and try to find the sessile polyp at 80 cm will also drive the decision making.   If another colonoscopy is preformed, it should be under general and at least 1 1/2 hours set aside.  Ovidio Kin, MD, [facility] Surgery Pager: [phone] Office phone:  [phone]

## 2012-11-22 NOTE — Anesthesia Preprocedure Evaluation (Addendum)
Anesthesia Evaluation  Patient identified by MRN, date of birth, ID band Patient awake    Reviewed: Allergy & Precautions, H&P , NPO status , Patient's Chart, lab work & pertinent test results  Airway Mallampati: II TM Distance: >3 FB Neck ROM: Full    Dental No notable dental hx.    Pulmonary neg pulmonary ROS,  breath sounds clear to auscultation  Pulmonary exam normal       Cardiovascular negative cardio ROS  Rhythm:Regular Rate:Normal     Neuro/Psych negative neurological ROS  negative psych ROS   GI/Hepatic negative GI ROS, Neg liver ROS,   Endo/Other  negative endocrine ROS  Renal/GU Renal InsufficiencyRenal disease  negative genitourinary   Musculoskeletal negative musculoskeletal ROS (+)   Abdominal   Peds negative pediatric ROS (+)  Hematology negative hematology ROS (+)   Anesthesia Other Findings   Reproductive/Obstetrics negative OB ROS                          Anesthesia Physical Anesthesia Plan  ASA: III  Anesthesia Plan: General   Post-op Pain Management:    Induction: Intravenous  Airway Management Planned: Oral ETT  Additional Equipment:   Intra-op Plan:   Post-operative Plan: Extubation in OR  Informed Consent: I have reviewed the patients History and Physical, chart, labs and discussed the procedure including the risks, benefits and alternatives for the proposed anesthesia with the patient or authorized representative who has indicated his/her understanding and acceptance.   Dental advisory given  Plan Discussed with: CRNA and Surgeon  Anesthesia Plan Comments: (No versed)       Anesthesia Quick Evaluation

## 2012-11-22 NOTE — Anesthesia Postprocedure Evaluation (Signed)
  Anesthesia Post-op Note  Patient: [name]  Procedure(s) Performed: Procedure(s) (LRB): COLONOSCOPY (N/A) POLYPECTOMY ()  Patient Location: PACU  Anesthesia Type: General  Level of Consciousness: awake and alert   Airway and Oxygen Therapy: Patient Spontanous Breathing  Post-op Pain: mild  Post-op Assessment: Post-op Vital signs reviewed, Patient's Cardiovascular Status Stable, Respiratory Function Stable, Patent Airway and No signs of Nausea or vomiting  Last Vitals:  Filed Vitals:   11/22/12 1519  BP: 149/66  Pulse: 78  Temp: 36.5 C  Resp: 22    Post-op Vital Signs: stable   Complications: No apparent anesthesia complications

## 2012-11-23 ENCOUNTER — Encounter (HOSPITAL_COMMUNITY): Payer: Self-pay | Admitting: Surgery

## 2012-11-23 MED ORDER — PNEUMOCOCCAL VAC POLYVALENT 25 MCG/0.5ML IJ INJ
0.5000 mL | INJECTION | Freq: Once | INTRAMUSCULAR | Status: AC
  Administered 2012-11-23: 0.5 mL via INTRAMUSCULAR
  Filled 2012-11-23: qty 0.5

## 2012-11-23 MED ORDER — HYDROCODONE-ACETAMINOPHEN 5-325 MG PO TABS: 1.0000 | ORAL_TABLET | ORAL | Status: DC | PRN

## 2012-11-23 MED ORDER — INFLUENZA VIRUS VACC SPLIT PF IM SUSP
0.5000 mL | INTRAMUSCULAR | Status: AC
  Administered 2012-11-23: 0.5 mL via INTRAMUSCULAR
  Filled 2012-11-23: qty 0.5

## 2012-11-23 MED ORDER — PNEUMOCOCCAL 13-VAL CONJ VACC IM SUSP
0.5000 mL | Freq: Once | INTRAMUSCULAR | Status: DC
Start: 2012-11-23 — End: 2012-11-23
  Filled 2012-11-23: qty 0.5

## 2012-11-23 NOTE — Discharge Summary (Signed)
Patient ID: [name] MRN: [id number] DOB/AGE: 1919-08-11 78 y.o.  Admit date: 11/21/2012 Discharge date: 11/23/2012  Procedures: COLONOSCOPY with polypectomy of rectal polyp (90+% removed)  Consults: None  Reason for Admission: patient is a generally quite healthy 78 year old female who presents to the emergency room with rectal pain and bleeding. She has a history of a couple of presentations to the emergency room this past year with tissue protruding from the rectum and bleeding which was reduced in the emergency room and was felt to be rectal prolapse. She was evaluated in our office and on careful exam did not have evidence of rectal prolapse but did have some internal hemorrhoids and had an internal hemorrhoid banded. She also has chronic known bladder prolapse per vagina. She has some constipation. She has never had a colonoscopy. While straining at a stool last night she developed some bleeding and again pain at her rectum which has persisted and she presents to the emergency room.   Admission Diagnoses:  Prolapsing rectal mass  Hospital Course: The patient was admitted.  She was taken to the operating room later the day of admission and underwent colonoscopy with polypectomy under GET.  She tolerated the procedure well.  On POD# 1, she was passing flatus, tolerating a regular diet, and having minimal pain.  She was ready for dc home.  PE: Abd: soft, NT, ND, +BS Rectum: no bleeding  Discharge Diagnoses:  1. 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  Will need follow up colonoscopy to examine that lesion biopsy site again and to re-examine a second lesion at 90 cm from anal verge that I was unable to biopsy.  Plan follow up colonoscopy in about 3 months.  DN]  Discharge Medications:   Medication List     As of 11/23/2012  9:53 AM    STOP taking these medications         sulfamethoxazole-trimethoprim 800-160 MG per tablet     Commonly known as: BACTRIM DS,SEPTRA DS      TAKE these medications         aspirin EC 81 MG tablet   Take 81 mg by mouth daily.      docusate sodium 100 MG capsule   Commonly known as: COLACE   Take 100 mg by mouth daily.      HYDROcodone-acetaminophen 5-325 MG per tablet   Commonly known as: NORCO/VICODIN   Take 1-2 tablets by mouth every 4 (four) hours as needed.      multivitamin with minerals Tabs   Take 1 tablet by mouth daily.      Vitamin D3 5000 UNITS Tabs   Take 1 tablet by mouth daily.        Discharge Instructions:     Follow-up Information    Follow up with Eyob Godlewski H, MD. Schedule an appointment as soon as possible for a visit in 3 weeks.   Contact information:   [address] [place] [address] [phone]          Signed: Letha Cape 11/23/2012, 9:53 AM

## 2012-12-17 ENCOUNTER — Telehealth (INDEPENDENT_AMBULATORY_CARE_PROVIDER_SITE_OTHER): Payer: Self-pay

## 2012-12-17 NOTE — Telephone Encounter (Signed)
Patients son gave me number to call to put OV appt on his V/M - [phone] Patients appt is 01/04/13 @ 4:50p

## 2012-12-28 ENCOUNTER — Telehealth (INDEPENDENT_AMBULATORY_CARE_PROVIDER_SITE_OTHER): Payer: Self-pay

## 2013-01-01 ENCOUNTER — Ambulatory Visit (INDEPENDENT_AMBULATORY_CARE_PROVIDER_SITE_OTHER): Payer: Medicare Other | Admitting: Surgery

## 2013-01-01 ENCOUNTER — Encounter (INDEPENDENT_AMBULATORY_CARE_PROVIDER_SITE_OTHER): Payer: Self-pay | Admitting: Surgery

## 2013-01-01 VITALS — BP 164/62 | HR 76 | Temp 97.4°F | Resp 12 | Ht 61.0 in | Wt 149.0 lb

## 2013-01-01 DIAGNOSIS — K621 Rectal polyp: Secondary | ICD-10-CM

## 2013-01-01 HISTORY — DX: Rectal polyp: K62.1

## 2013-01-01 NOTE — Progress Notes (Addendum)
Re:   [name] DOB:   Dec 13, 1918 MRN:   [id number]  ASSESSMENT AND PLAN: 1.  Rectal polyp excised - 11/22/2012  Villous adenoma with high grade dysplasia  Questionably completely excised.  Discussed planned repeat colonoscopy with patient and son.  She has been given a bowel prep and a book about colonic polyps.  Will plan repeat colonoscopy under general anesthesia in March.  If urology/gyn needs to examine her at the same time, this could be done.  [I got a call from the son, Leonette Most, that his mother was very upset about the colonoscopy and that she did not want to go through with it  We talked on the phone about the benefits and risks of colonoscopy.  But at 79 yo, the benefits are probably small.  I reassured him that it would be okay for her not to have the colonoscopy.  I would be happy to help in any way that I can going forward.  So I left her appointment open and we will cancel the upcoming colonoscopy.  DN  02/13/2013]  2.  Questionable second tumor at 90 cm.  Will try to find this area with repeat colonoscopy.  3.  Rectal/uterine prolapse  The patient has seen Dr. Brunilda Payor in the past.  [name]'s (her son) wife has seen Dr. Perley Jain.  I encouraged them to go ahead and get an opinion. 4.  Chronic renal disease, Stage III    Creatinine - 2.46 - 11/21/2012 5.  Anemia   Hgb - 9.9 on 10/25/2012 6.  Broke left hip about 10 years ago.  Uses a cane to walk.   Chief Complaint  Patient presents with  . Follow-up    1st po rectal polyp   REFERRING PHYSICIAN: Oneal Grout, MD  HISTORY OF PRESENT ILLNESS: [name] is a 79 y.o. (DOB: 1919-10-19)  white female whose primary care physician is PANDEY, MAHIMA, MD and comes to me today for follow up of rectal polyp.  I took care of the patient on 11/22/2012. She presented to [facility] long emergency room with a prolapsing rectal polyp. The polyp was approximately 15 cm from the anal verge. The polyp measured about 4-5 cm diameter. I think  I removed at least 80-90% of the polyp. The final pathology revealed villous adenoma with high-grade dysplasia. At the same colonoscopy, I saw a flat lesion at 80 cm from anal verge. Despite 3 additional passes, I could not locate this lesion again.  So the plan is to give her approximately 3 months from her last colonoscopy and repeat her colonoscopic exam. This would be to reexamine the polyp I have already excised and to try to find the other lesion.  She has a significant uterine prolapse where her uterus prolapses between 4-7 cm out of her vagina. The patient is not anxious for surgery. I encouraged her to at least get an opinion. The patient has an appointment to see Dr. Oneal Grout on 01/09/2013. I encourage the son to discuss with Dr. Oneal Grout the upcoming colonoscopy on [name].  [name] was out sweeping the yard yesterday, so despite her age, she stays active.    Past Medical History  Diagnosis Date  . Prolapsed bladder   . Rectal prolapse   . Hyperlipidemia   . Chronic kidney disease (CKD), stage III (moderate) 06/27/2012  . Bladder prolapse, female, acquired 06/27/2012  . Prolapsed internal hemorrhoids 06/27/2012      Past Surgical History  Procedure Date  .  Appendectomy   . Joint replacement     lt hip  . Cataract extraction w/ intraocular lens  implant, bilateral   . Colonoscopy 11/22/2012    Procedure: COLONOSCOPY;  Surgeon: Kandis Cocking, MD;  Location: [facility] ORS;  Service: General;  Laterality: N/A;  . Polypectomy 11/22/2012    Procedure: POLYPECTOMY;  Surgeon: Kandis Cocking, MD;  Location: [facility] ORS;  Service: General;;      Current Outpatient Prescriptions  Medication Sig Dispense Refill  . aspirin EC 81 MG tablet Take 81 mg by mouth daily.      . Cholecalciferol (VITAMIN D3) 5000 UNITS TABS Take 1 tablet by mouth daily.      Marland Kitchen docusate sodium (COLACE) 100 MG capsule Take 100 mg by mouth daily.       . Multiple Vitamin (MULTIVITAMIN WITH MINERALS) TABS Take  1 tablet by mouth daily.         No Known Allergies  REVIEW OF SYSTEMS: Skin:  No history of rash.  No history of abnormal moles. Infection:   No history of MRSA. Neurologic:  No history of stroke.  No history of seizure.  No history of headaches. Cardiac:  No history of hypertension. No history of heart disease.  No history of prior cardiac catheterization.  No history of seeing a cardiologist. Pulmonary:  Does not smoke cigarettes.  No asthma or bronchitis.  No OSA/CPAP.  Endocrine:  No diabetes. No thyroid disease. Gastrointestinal:  No history of stomach disease.  No history of liver disease.  No history of gall bladder disease.  No history of pancreas disease.  GYN:  Has significant uterine prolapse. Urologic:  Elevated creatinine - Chronic renal disease.  Her son blames it the prolapsed bladder. Musculoskeletal:  Broke left hip about 10 years.  She has broken both her wrist in the past. Hematologic:  No bleeding disorder.  No history of anemia.  Not anticoagulated. Psycho-social:  The patient is oriented.   The patient has no obvious psychologic or social impairment to understanding our conversation and plan.  SOCIAL and FAMILY HISTORY: Accompanied by son, Leonette Most. Her husband is deceased.  PHYSICAL EXAM: BP 164/62  Pulse 76  Temp 97.4 F (36.3 C) (Temporal)  Resp 12  Ht 5\' 1"  (1.549 m)  Wt 149 lb (67.586 kg)  BMI 28.15 kg/m2  General: Older WF who is alert and generally healthy appearing. She uses a cane to walk. HEENT: Normal. Pupils equal. Neck: Supple. No mass.  No thyroid mass. Lungs: Clear to auscultation and symmetric breath sounds. Heart:  RRR. No murmur or rub.  Abdomen: Soft. No mass. No tenderness. No hernia. Normal bowel sounds.  No abdominal scars. Rectal: I could not feel a rectal mass. I think the polyp stalk is above the reach of my finger.  Her uterus is prolapsed, is reducible, but prolapses readily. Extremities:  Needs cane to walk, but moves fairly  well. Psychiatric: Has normal mood and affect. Behavior is normal.   DATA REVIEWED: In Epic.  Ovidio Kin, MD,  [facility] [address] [place]  [address]   [place], [place]    [address] Phone:  [phone] FAX:  [phone]

## 2013-01-02 NOTE — Telephone Encounter (Signed)
error 

## 2013-01-04 ENCOUNTER — Encounter (INDEPENDENT_AMBULATORY_CARE_PROVIDER_SITE_OTHER): Payer: Medicare Other | Admitting: Surgery

## 2013-02-07 ENCOUNTER — Encounter (HOSPITAL_COMMUNITY): Payer: Self-pay | Admitting: *Deleted

## 2013-02-07 ENCOUNTER — Encounter (HOSPITAL_COMMUNITY): Payer: Self-pay

## 2013-02-07 NOTE — Pre-Procedure Instructions (Addendum)
Talked to patient's son-[name] as he is POA and given him instructions for her procedure on 02/15/2013. Her son states she does not hear well and does not remember so information obtained from him and he was given all the instructions.Informed him to report to admitting first.

## 2013-02-15 ENCOUNTER — Encounter (HOSPITAL_COMMUNITY): Admission: RE | Payer: Self-pay | Source: Ambulatory Visit

## 2013-02-15 ENCOUNTER — Ambulatory Visit (HOSPITAL_COMMUNITY): Admission: RE | Admit: 2013-02-15 | Payer: Medicare Other | Source: Ambulatory Visit | Admitting: Surgery

## 2013-02-15 SURGERY — COLONOSCOPY
Anesthesia: General

## 2013-04-09 ENCOUNTER — Encounter: Payer: Self-pay | Admitting: *Deleted

## 2013-04-10 ENCOUNTER — Ambulatory Visit: Payer: Self-pay | Admitting: Internal Medicine

## 2013-07-24 ENCOUNTER — Ambulatory Visit (INDEPENDENT_AMBULATORY_CARE_PROVIDER_SITE_OTHER): Payer: Medicare Other | Admitting: General Surgery

## 2013-07-24 ENCOUNTER — Encounter (INDEPENDENT_AMBULATORY_CARE_PROVIDER_SITE_OTHER): Payer: Self-pay | Admitting: General Surgery

## 2013-07-24 VITALS — BP 130/60 | HR 101 | Temp 97.5°F | Ht 60.0 in | Wt 150.0 lb

## 2013-07-24 DIAGNOSIS — K648 Other hemorrhoids: Secondary | ICD-10-CM

## 2013-07-24 DIAGNOSIS — K5900 Constipation, unspecified: Secondary | ICD-10-CM | POA: Insufficient documentation

## 2013-07-24 MED ORDER — HYDROCORTISONE 2.5 % RE CREA: TOPICAL_CREAM | Freq: Two times a day (BID) | RECTAL | Status: DC

## 2013-07-24 NOTE — Patient Instructions (Addendum)
Get constipation under control.    Add colace 100 mg twice daily.  Add laxative daily.  Use miralax or milk of magnesia.  Do warm water soaks.  Can use the sitz baths in the toilet bowl.    Add hemorrhoid cream.

## 2013-07-25 NOTE — Assessment & Plan Note (Signed)
Pt is extremely constipated, and has only started back on colace once daily 3 days ago. Pt advised to go to twice daily on stool softener and add laxative daily.  She and son are advised that constipation is big trigger of the hemorrhoids and rectal pain/discomfort.  She is advised that pain medications make this constipation worse, and are counterproductive.

## 2013-07-25 NOTE — Progress Notes (Signed)
Subjective:     Patient ID: [name], female   DOB: 08/25/1919, 79 y.o.   MRN: [id number]  HPI Pt is 79 yo female with mild dementia who presents with rectal pain.  She is s/p hemorrhoid surgery with Dr. Michaell Cowing, s/p excision of rectal polyp with Dr. Ezzard Standing, and also has known bladder/uterine prolapse.  She is complaining to son of throbbing rectal pain.  She has previously had bleeding, but has not been complaining of this.  She has not had any tissue prolapsing through her anus.  She has recently had significant constipation.  After the rectal polyp removal, she was on colace, however, at some point, she started to get loose stools.  She had stopped the colace at that point until 3 days ago, when the constipation became unbearable.    Review of Systems  Gastrointestinal: Positive for rectal pain (throbbing). Negative for blood in stool and anal bleeding.  Psychiatric/Behavioral: Positive for confusion.       Objective:   Physical Exam  Constitutional: She is oriented to person, place, and time. She appears well-developed and well-nourished. No distress.  HENT:  Head: Normocephalic and atraumatic.  Eyes: Pupils are equal, round, and reactive to light. No scleral icterus.  Cardiovascular: Normal rate.   Pulmonary/Chest: Effort normal. No respiratory distress.  Genitourinary: Rectal exam shows internal hemorrhoid and tenderness.  Copious amount of firm stool in rectal vault.    Neurological: She is alert and oriented to person, place, and time.  Skin: Skin is warm and dry. She is not diaphoretic.  Psychiatric: Her behavior is normal.       Assessment/Plan:     Unspecified constipation Pt is extremely constipated, and has only started back on colace once daily 3 days ago. Pt advised to go to twice daily on stool softener and add laxative daily.  She and son are advised that constipation is big trigger of the hemorrhoids and rectal pain/discomfort.  She is advised that pain  medications make this constipation worse, and are counterproductive.     Internal hemorrhoids One internal hemorrhoid was injected, but this was poorly tolerated.    The remaining hemorrhoids were not injected due to pt comfort. The patient was prescribed anusol HC and advised to use this twice daily.

## 2013-07-25 NOTE — Assessment & Plan Note (Signed)
One internal hemorrhoid was injected, but this was poorly tolerated.    The remaining hemorrhoids were not injected due to pt comfort. The patient was prescribed anusol HC and advised to use this twice daily.

## 2013-07-31 ENCOUNTER — Encounter: Payer: Self-pay | Admitting: Internal Medicine

## 2013-07-31 ENCOUNTER — Ambulatory Visit (INDEPENDENT_AMBULATORY_CARE_PROVIDER_SITE_OTHER): Payer: Medicare Other | Admitting: Internal Medicine

## 2013-07-31 VITALS — BP 140/78 | HR 80 | Temp 98.3°F | Wt 136.0 lb

## 2013-07-31 DIAGNOSIS — E785 Hyperlipidemia, unspecified: Secondary | ICD-10-CM

## 2013-07-31 DIAGNOSIS — K648 Other hemorrhoids: Secondary | ICD-10-CM

## 2013-07-31 DIAGNOSIS — D649 Anemia, unspecified: Secondary | ICD-10-CM

## 2013-07-31 DIAGNOSIS — F0280 Dementia in other diseases classified elsewhere without behavioral disturbance: Secondary | ICD-10-CM

## 2013-07-31 DIAGNOSIS — Z23 Encounter for immunization: Secondary | ICD-10-CM | POA: Insufficient documentation

## 2013-07-31 DIAGNOSIS — N8110 Cystocele, unspecified: Secondary | ICD-10-CM

## 2013-07-31 DIAGNOSIS — K5900 Constipation, unspecified: Secondary | ICD-10-CM

## 2013-07-31 DIAGNOSIS — N8111 Cystocele, midline: Secondary | ICD-10-CM

## 2013-07-31 MED ORDER — SENNOSIDES-DOCUSATE SODIUM 8.6-50 MG PO TABS: 2.0000 | ORAL_TABLET | Freq: Every day | ORAL | Status: DC

## 2013-07-31 MED ORDER — HYDROCORTISONE 2.5 % RE CREA: TOPICAL_CREAM | Freq: Two times a day (BID) | RECTAL | Status: DC

## 2013-07-31 MED ORDER — POLYETHYLENE GLYCOL 3350 17 GM/SCOOP PO POWD: 17.0000 g | Freq: Every day | ORAL | Status: DC

## 2013-07-31 NOTE — Progress Notes (Signed)
Patient ID: [name], female   DOB: 05-30-1919, 79 y.o.   MRN: [id number]  Chief Complaint  Patient presents with  . Follow-up    3 month follow-up   . Hemorrhoids  . Urinary Tract Infection    patient thinks she has a kidney problem    HPI 79 y/o female patient here with her son. She has hx of external and internal hemorrhoids. She also has uterine and bladder prolapse. She has hx of dementia. She lives by herself and her son helps with her daily activities and lives nearby She has been constipated and has trouble with her bowel movement and severe discomfort and pain on defecation She feels she is not able to empty her bladder fully. denies dysuria, hematuria. Has increased urgency She also manually reduces her prolapse She has refused surgery at this point She also refuses to move in with her son She does not want influenza vaccine  Review of Systems  Constitutional: Negative for fever, chills and weight loss.  HENT: Negative for congestion and sore throat.   Eyes: Negative for blurred vision.  Respiratory: Negative for cough and shortness of breath.   Cardiovascular: Negative for chest pain, palpitations, orthopnea and leg swelling.  Gastrointestinal: Positive for constipation. Negative for heartburn, nausea, vomiting, abdominal pain and blood in stool.  Genitourinary: Negative for dysuria, urgency and frequency.  Musculoskeletal: Positive for joint pain. Negative for myalgias and falls.  Skin: Negative for itching and rash.  Neurological: Negative for dizziness, seizures, loss of consciousness, weakness and headaches.  Psychiatric/Behavioral: Positive for memory loss. Negative for depression.   No Known Allergies  Past Medical History  Diagnosis Date  . Prolapsed bladder   . Rectal prolapse   . Hyperlipidemia   . Chronic kidney disease (CKD), stage III (moderate) 06/27/2012  . Bladder prolapse, female, acquired 06/27/2012  . Prolapsed internal hemorrhoids 06/27/2012  .  Unspecified vitamin D deficiency   . Alzheimer's disease   . Anemia   . Hydronephrosis   . Personal history of fall     Current Outpatient Prescriptions on File Prior to Visit  Medication Sig Dispense Refill  . aspirin EC 81 MG tablet Take 81 mg by mouth daily.      . calcium carbonate 200 MG capsule Take 250 mg by mouth 2 (two) times daily with a meal.      . Multiple Vitamin (MULTIVITAMIN WITH MINERALS) TABS Take 1 tablet by mouth daily.       No current facility-administered medications on file prior to visit.    BP 140/78  Pulse 80  Temp(Src) 98.3 F (36.8 C) (Oral)  Wt 136 lb (61.689 kg)  BMI 26.56 kg/m2  SpO2 97%  gen- elderly female in NAD HEENT- no pallor, no icterus, no LAD, MMM cvs- ns 1,s2, rrr respi- CTAB abdo- bs+, soft, non tender, no flank tenderness Ext- able to move all 4, cane for walking Genitalia- has bladder and uterine prolapse, externa hemorrhoids, no fecal impaction Psych- aao x2, baseline confusion  Assessment/plan  Bladder prolapse- her sense of need for urination is likely from the prolapse. No signs/ symptoms of uti at present. Encouraged hydration. Surgery is an option to help her symptomatically.  Internal hemorrhoids- will need banding vs injection. Pt to follow with general surgery. Use anusol for now, refill provided  Constipation- will have her on miralax twice a day and senna-s 2 tab daily for now. Having regular bowel movement and prevention of straining will reduce the discomfort  Anemia-  with hx of anemia and her hemorrhoids, check cbc  ckd stage 2- check cmp  Dementia- pt at home by herself. i feel this is not a safe environment for her and she should move in with her son or be in an assisted living/ nursing home. Son is going to talk with his family and get in touch with [place]  hyperlipidemai- off statin. Check lipid panel. Continue aspirin

## 2013-08-01 ENCOUNTER — Other Ambulatory Visit: Payer: Self-pay

## 2013-08-01 LAB — COMPREHENSIVE METABOLIC PANEL
ALT: 11 IU/L (ref 0–32)
AST: 10 IU/L (ref 0–40)
Albumin/Globulin Ratio: 1.4 (ref 1.1–2.5)
Alkaline Phosphatase: 46 IU/L (ref 39–117)
Chloride: 93 mmol/L — ABNORMAL LOW (ref 97–108)
GFR calc Af Amer: 21 mL/min/{1.73_m2} — ABNORMAL LOW (ref >59)
Glucose: 90 mg/dL (ref 65–99)
Potassium: 4.6 mmol/L (ref 3.5–5.2)
Sodium: 130 mmol/L — ABNORMAL LOW (ref 134–144)
Total Bilirubin: 0.2 mg/dL (ref 0.0–1.2)
Total Protein: 6.4 g/dL (ref 6.0–8.5)

## 2013-08-01 LAB — CBC WITH DIFFERENTIAL/PLATELET
Basophils Absolute: 0 10*3/uL (ref 0.0–0.2)
Eosinophils Absolute: 0.1 10*3/uL (ref 0.0–0.4)
Hemoglobin: 10.3 g/dL — ABNORMAL LOW (ref 11.1–15.9)
Lymphs: 27 % (ref 14–46)
MCH: 30.1 pg (ref 26.6–33.0)
MCHC: 32.9 g/dL (ref 31.5–35.7)
MCV: 92 fL (ref 79–97)
Monocytes Absolute: 0.6 10*3/uL (ref 0.1–0.9)
Neutrophils Absolute: 4.1 10*3/uL (ref 1.4–7.0)
Neutrophils Relative %: 63 % (ref 40–74)

## 2013-08-01 LAB — LIPID PANEL: Cholesterol, Total: 233 mg/dL — ABNORMAL HIGH (ref 100–199)

## 2013-08-01 MED ORDER — SIMVASTATIN 5 MG PO TABS: 5.0000 mg | ORAL_TABLET | Freq: Every day | ORAL | Status: DC

## 2013-08-01 NOTE — Telephone Encounter (Signed)
RX sent to [facility]. °

## 2013-08-06 ENCOUNTER — Ambulatory Visit: Payer: Self-pay | Admitting: Internal Medicine

## 2013-09-18 ENCOUNTER — Encounter: Payer: Self-pay | Admitting: Internal Medicine

## 2013-09-18 ENCOUNTER — Ambulatory Visit (INDEPENDENT_AMBULATORY_CARE_PROVIDER_SITE_OTHER): Payer: Medicare Other | Admitting: Internal Medicine

## 2013-09-18 VITALS — BP 130/68 | HR 82 | Temp 97.4°F | Resp 18 | Ht 60.0 in | Wt 137.8 lb

## 2013-09-18 DIAGNOSIS — L03119 Cellulitis of unspecified part of limb: Secondary | ICD-10-CM

## 2013-09-18 DIAGNOSIS — L908 Other atrophic disorders of skin: Secondary | ICD-10-CM

## 2013-09-18 DIAGNOSIS — L02419 Cutaneous abscess of limb, unspecified: Secondary | ICD-10-CM

## 2013-09-18 MED ORDER — NYSTATIN-TRIAMCINOLONE 100000-0.1 UNIT/GM-% EX OINT: TOPICAL_OINTMENT | Freq: Two times a day (BID) | CUTANEOUS | Status: DC

## 2013-09-18 MED ORDER — CEPHALEXIN 500 MG PO CAPS: 500.0000 mg | ORAL_CAPSULE | Freq: Two times a day (BID) | ORAL | Status: DC

## 2013-09-18 NOTE — Progress Notes (Signed)
Patient ID: [name], female   DOB: 1919/05/06, 79 y.o.   MRN: [id number]  Chief Complaint  Patient presents with  . Acute Visit    rash on L/R feet and ankles x 2-3 weeks, was using vaseline on the areas.   No Known Allergies  hpi 79 y/o female patient is here with her son. She has noticed a red area on her legs for a week and half with skin area peeling off. Denies any pain in the area. No documented fever or chills. No trauma reported  ROS No known hx of PVD No fever or chills No chest pain or dyspnea No fall/ [place] No focal weakness  Past Medical History  Diagnosis Date  . Prolapsed bladder   . Rectal prolapse   . Hyperlipidemia   . Chronic kidney disease (CKD), stage III (moderate) 06/27/2012  . Bladder prolapse, female, acquired 06/27/2012  . Prolapsed internal hemorrhoids 06/27/2012  . Unspecified vitamin D deficiency   . Alzheimer's disease   . Anemia   . Hydronephrosis   . Personal history of fall    Current Outpatient Prescriptions on File Prior to Visit  Medication Sig Dispense Refill  . aspirin EC 81 MG tablet Take 81 mg by mouth daily.      . hydrocortisone (ANUSOL-HC) 2.5 % rectal cream Place rectally 2 (two) times daily. Apply around anus for irritated & painful hemorrhoids  15 g  3  . Multiple Vitamin (MULTIVITAMIN WITH MINERALS) TABS Take 1 tablet by mouth daily.      . polyethylene glycol powder (GLYCOLAX/MIRALAX) powder Take 17 g by mouth daily.  3350 g  3  . simvastatin (ZOCOR) 5 MG tablet Take 1 tablet (5 mg total) by mouth at bedtime.  90 tablet  0   No current facility-administered medications on file prior to visit.    Physical exam BP 130/68  Pulse 82  Temp(Src) 97.4 F (36.3 C) (Oral)  Resp 18  Ht 5' (1.524 m)  Wt 137 lb 12.8 oz (62.506 kg)  BMI 26.91 kg/m2  SpO2 97%  gen- elderly female in NAD HEENT- no pallor, no icterus, no LAD, MMM cvs- ns 1,s2, rrr respi- CTAB abdo- bs+, soft, non tender, no flank tenderness Ext- able to move all  4, cane for walking, palpable dorsalis pedis and posterior tibialis Skin- has erythema with warmth on both her lower legs from feet upto just above ankle, not symmetrical in pattern, skin peeling off and scale on left leg, normal capillary refill Psych- aao x2, baseline confusion  Assessment/plan  Cellulitis- concerns for cellulitis in left leg. Will have her on keflex 500 mg q12h for a week and reassess  Skin changes- on both legs, not entirely symmetrical. Good pedal pulses and capillary refill. Has hair loss in both legs. With her age concerns for peripheral vascular disease. Will treat presumed cellulits first and then reassess. Will have her on nystatin-triamcinolone cream bid for a week to the feet and reassess if no improvement  Internal hemorrhoids- has refused surgery, regular bowel movement at present, no blood in stool recently. Monitor clinically, continue bowel regimen and hemorrhoid cream

## 2013-10-30 ENCOUNTER — Ambulatory Visit (INDEPENDENT_AMBULATORY_CARE_PROVIDER_SITE_OTHER): Payer: Medicare Other | Admitting: Internal Medicine

## 2013-10-30 ENCOUNTER — Encounter: Payer: Self-pay | Admitting: Internal Medicine

## 2013-10-30 VITALS — BP 136/70 | HR 74 | Temp 97.9°F | Wt 139.0 lb

## 2013-10-30 DIAGNOSIS — R21 Rash and other nonspecific skin eruption: Secondary | ICD-10-CM | POA: Insufficient documentation

## 2013-10-30 DIAGNOSIS — L03116 Cellulitis of left lower limb: Secondary | ICD-10-CM

## 2013-10-30 DIAGNOSIS — F0280 Dementia in other diseases classified elsewhere without behavioral disturbance: Secondary | ICD-10-CM

## 2013-10-30 DIAGNOSIS — L02419 Cutaneous abscess of limb, unspecified: Secondary | ICD-10-CM

## 2013-10-30 MED ORDER — PREDNISONE 50 MG PO TABS: ORAL_TABLET | ORAL | Status: DC

## 2013-10-30 MED ORDER — HYDROCORTISONE 1 % EX OINT: 1.0000 "application " | TOPICAL_OINTMENT | Freq: Two times a day (BID) | CUTANEOUS | Status: DC

## 2013-10-30 MED ORDER — SIMVASTATIN 5 MG PO TABS: 5.0000 mg | ORAL_TABLET | Freq: Every day | ORAL | Status: DC

## 2013-10-30 MED ORDER — CETIRIZINE HCL 5 MG PO TABS: 5.0000 mg | ORAL_TABLET | Freq: Every day | ORAL | Status: DC

## 2013-10-30 MED ORDER — DOXYCYCLINE HYCLATE 100 MG PO TABS: 100.0000 mg | ORAL_TABLET | Freq: Two times a day (BID) | ORAL | Status: DC

## 2013-10-30 NOTE — Progress Notes (Signed)
Patient ID: [name], female   DOB: 05/20/1919, 79 y.o.   MRN: [id number]  Chief Complaint  Patient presents with  . Medical Managment of Chronic Issues    3 month follow-up  . Rash    Rash on legs    No Known Allergies  HPI 79 y/o female patient here for follow up. She has had redness with sweling on her right leg. She had cellulitis last visit and was treated for it and had improvement but this has occurred again and appears worse than before. She has discomfort and itching. Son has also noticed pinpoint rash on both her legs for a week now. Denies any known insect bite. No new products tried. Denies any pain Has memory issues and lives by herself Ate oysters for thank giving and normally does not eat seafood No other concerns  Review of Systems  Constitutional: Negative for fever, chills and weight loss.  HENT: Negative for congestion and sore throat.   Eyes: Negative for blurred vision.  Respiratory: Negative for cough and shortness of breath.   Cardiovascular: Negative for chest pain, palpitations, orthopnea and leg swelling.  Gastrointestinal: Positive for constipation. Negative for heartburn, nausea, vomiting, abdominal pain and blood in stool.  Genitourinary: Negative for dysuria, urgency and frequency.  Musculoskeletal: Positive for joint pain. Negative for myalgias and falls.   Neurological: Negative for dizziness, seizures, loss of consciousness, weakness and headaches.  Psychiatric/Behavioral: Positive for memory loss. Negative for depression.   Physical exam BP 136/70  Pulse 74  Temp(Src) 97.9 F (36.6 C) (Oral)  Wt 139 lb (63.05 kg)  SpO2 95%  gen- elderly female in NAD HEENT- no pallor, no icterus, no LAD, MMM cvs- ns 1,s2, rrr respi- CTAB abdo- bs+, soft, non tender, no flank tenderness Ext- able to move all 4, cane for walking Genitalia- has bladder and uterine prolapse Skin- has redness with warmth on left lower leg, discoloration on both feet, decreased  dorsalis pedis in both feet, has erythematous papules in both legs a little above the knees, blanching, non tender, no open sores. Has two of these spots in her right mid back area Psych- aao x2, baseline confusion  Assessment/plan  Constipation- continue miralax twice a day and senna-s 2 tab daily for now.   Dementia- pt at home by herself. Refuses moving in with son. Able to carry out most of her ADLs. monitor  Cellulitis- will have her on doxycycline 100 mg bid for a week, prednsione 50 mg daily for 5 days and keep legs elevated at rest. Reassess in a week  Erythematous rash- unclear cause- possible reaction to oyster. Will rule out liver abnormality leading to this rash appearance. Check eosinophil count. A course of prednsione with cetirizine and low percent steroid cream prescribed. Reassess in  A week if no improvement. Will consider dermatology referral if needed

## 2013-10-31 LAB — COMPREHENSIVE METABOLIC PANEL
ALT: 8 IU/L (ref 0–32)
AST: 12 IU/L (ref 0–40)
Albumin/Globulin Ratio: 1.1 (ref 1.1–2.5)
Calcium: 9.4 mg/dL (ref 8.6–10.2)
Creatinine, Ser: 2.45 mg/dL — ABNORMAL HIGH (ref 0.57–1.00)
GFR calc Af Amer: 19 mL/min/{1.73_m2} — ABNORMAL LOW (ref >59)
GFR calc non Af Amer: 17 mL/min/{1.73_m2} — ABNORMAL LOW (ref >59)
Globulin, Total: 3.1 g/dL (ref 1.5–4.5)
Potassium: 5 mmol/L (ref 3.5–5.2)
Sodium: 139 mmol/L (ref 134–144)
Total Bilirubin: <0.2 mg/dL (ref 0.0–1.2)
Total Protein: 6.5 g/dL (ref 6.0–8.5)

## 2013-10-31 LAB — CBC WITH DIFFERENTIAL/PLATELET
Basos: 1 %
Eos: 2 %
Eosinophils Absolute: 0.1 10*3/uL (ref 0.0–0.4)
HCT: 30 % — ABNORMAL LOW (ref 34.0–46.6)
Hemoglobin: 9.9 g/dL — ABNORMAL LOW (ref 11.1–15.9)
MCH: 29.9 pg (ref 26.6–33.0)
MCV: 91 fL (ref 79–97)
Monocytes Absolute: 0.5 10*3/uL (ref 0.1–0.9)
Neutrophils Absolute: 4.2 10*3/uL (ref 1.4–7.0)
RBC: 3.31 x10E6/uL — ABNORMAL LOW (ref 3.77–5.28)
WBC: 6.4 10*3/uL (ref 3.4–10.8)

## 2013-11-04 ENCOUNTER — Encounter: Payer: Self-pay | Admitting: *Deleted

## 2013-11-06 ENCOUNTER — Encounter: Payer: Self-pay | Admitting: Internal Medicine

## 2013-11-06 ENCOUNTER — Ambulatory Visit (INDEPENDENT_AMBULATORY_CARE_PROVIDER_SITE_OTHER): Payer: Medicare Other | Admitting: Internal Medicine

## 2013-11-06 VITALS — BP 138/64 | HR 59 | Temp 97.9°F | Wt 142.0 lb

## 2013-11-06 DIAGNOSIS — I872 Venous insufficiency (chronic) (peripheral): Secondary | ICD-10-CM

## 2013-11-06 DIAGNOSIS — L02419 Cutaneous abscess of limb, unspecified: Secondary | ICD-10-CM

## 2013-11-06 DIAGNOSIS — L03116 Cellulitis of left lower limb: Secondary | ICD-10-CM

## 2013-11-06 DIAGNOSIS — I8310 Varicose veins of unspecified lower extremity with inflammation: Secondary | ICD-10-CM

## 2013-11-06 MED ORDER — HYDROCORTISONE 1 % EX OINT: TOPICAL_OINTMENT | CUTANEOUS | Status: DC

## 2013-11-06 NOTE — Progress Notes (Signed)
Patient ID: [name], female   DOB: 06-Oct-1919, 79 y.o.   MRN: [id number]  No Known Allergies  Chief Complaint  Patient presents with  . Follow-up    Re-examine legs     HPI 79 y/o female patient here for follow up. The cellulitis has resolved and inflammation subsided. Sher itching has subsisded Denies any pain Has memory issues and lives by herself Tolerated prednisone and keflex well  Review of Systems   Constitutional: Negative for fever, chills and weight loss.   HENT: Negative for congestion and sore throat.    Eyes: Negative for blurred vision.   Respiratory: Negative for cough and shortness of breath.    Cardiovascular: Negative for chest pain, palpitations, orthopnea and leg swelling.   Gastrointestinal: Positive for constipation. Negative for heartburn, nausea, vomiting, abdominal pain and blood in stool.   Genitourinary: Negative for dysuria, urgency and frequency.   Musculoskeletal: Positive for joint pain. Negative for myalgias and falls.    Neurological: Negative for dizziness, seizures, loss of consciousness, weakness and headaches.   Psychiatric/Behavioral: Positive for memory loss. Negative for depression.   Physical exam BP 138/64  Pulse 59  Temp(Src) 97.9 F (36.6 C) (Oral)  Wt 142 lb (64.411 kg)  SpO2 95%  gen- elderly female in NAD HEENT- no pallor, no icterus, no LAD, MMM cvs- ns 1,s2, rrr respi- CTAB abdo- bs+, soft, non tender, no flank tenderness Ext- able to move all 4, cane for walking, good dorsalis pedis Skin- minimal erythema noted in both legs, papules have resolved with few resolving macules. Has chronic venpus stasis skin changes. non tender, no open sores.  Psych- aao x2, baseline confusion  Assessment/plan  Cellulitis- resolved at present. Monitor clinically  Chronic venous stasis- with skin changes. Limb elevation at rest and to use hydrocortisone cream twice a day for one more week and then once a day to help with the erythema and  prn cetirizine for itching

## 2014-02-04 ENCOUNTER — Ambulatory Visit (INDEPENDENT_AMBULATORY_CARE_PROVIDER_SITE_OTHER): Payer: Medicare Other | Admitting: Internal Medicine

## 2014-02-04 ENCOUNTER — Encounter: Payer: Self-pay | Admitting: Internal Medicine

## 2014-02-04 VITALS — BP 130/64 | HR 56 | Temp 97.4°F | Wt 141.0 lb

## 2014-02-04 DIAGNOSIS — D649 Anemia, unspecified: Secondary | ICD-10-CM

## 2014-02-04 DIAGNOSIS — E785 Hyperlipidemia, unspecified: Secondary | ICD-10-CM

## 2014-02-04 DIAGNOSIS — K620 Anal polyp: Secondary | ICD-10-CM

## 2014-02-04 DIAGNOSIS — K5900 Constipation, unspecified: Secondary | ICD-10-CM

## 2014-02-04 DIAGNOSIS — K621 Rectal polyp: Secondary | ICD-10-CM

## 2014-02-04 DIAGNOSIS — N183 Chronic kidney disease, stage 3 unspecified: Secondary | ICD-10-CM

## 2014-02-04 DIAGNOSIS — F0280 Dementia in other diseases classified elsewhere without behavioral disturbance: Secondary | ICD-10-CM

## 2014-02-04 DIAGNOSIS — G309 Alzheimer's disease, unspecified: Secondary | ICD-10-CM

## 2014-02-04 NOTE — Progress Notes (Signed)
Patient ID: [name], female   DOB: Jul 11, 1919, 80 y.o.   MRN: [id number]    Chief Complaint  Patient presents with  . Medical Managment of Chronic Issues    3 month follow-up    No Known Allergies  HPI 80 y/o female patient here for routine follow up. She has dementia. Her constipation has improved. She had rectal polyp resection done. Note reviewed. Denies rectal bleed. Has been drinking more fluids. The rash in her legs have resolved. Moves around with a cane. No fall reported. Lives by herself and son continues to check on her. Refusing still to move in with her son  Review of Systems   Constitutional: Negative for fever, chills and weight loss.   HENT: Negative for congestion and sore throat.    Eyes: Negative for blurred vision.   Respiratory: Negative for cough and shortness of breath.    Cardiovascular: Negative for chest pain, palpitations, orthopnea and leg swelling.   Gastrointestinal:  Negative for heartburn, nausea, vomiting, abdominal pain and blood in stool.  current bowel regimen is helpful Genitourinary: Negative for dysuria, urgency and frequency.   Musculoskeletal: Positive for joint aches. Negative for myalgias and falls.    Neurological: Negative for dizziness, seizures, loss of consciousness, weakness and headaches.   Psychiatric/Behavioral: Positive for memory loss. Negative for depression.   Past Medical History  Diagnosis Date  . Prolapsed bladder   . Rectal prolapse   . Hyperlipidemia   . Chronic kidney disease (CKD), stage III (moderate) 06/27/2012  . Bladder prolapse, female, acquired 06/27/2012  . Prolapsed internal hemorrhoids 06/27/2012  . Unspecified vitamin D deficiency   . Alzheimer's disease   . Anemia   . Hydronephrosis   . Personal history of fall    Past Surgical History  Procedure Laterality Date  . Appendectomy    . Joint replacement      lt hip  . Cataract extraction w/ intraocular lens  implant, bilateral    . Colonoscopy   11/22/2012    Procedure: COLONOSCOPY;  Surgeon: Shann Medal, MD;  Location: [facility];  Service: General;  Laterality: N/A;  . Polypectomy  11/22/2012    Procedure: POLYPECTOMY;  Surgeon: Shann Medal, MD;  Location: [facility];  Service: General;;  . Bilateral wrist surgeries     Current Outpatient Prescriptions on File Prior to Visit  Medication Sig Dispense Refill  . aspirin EC 81 MG tablet Take 81 mg by mouth daily.      . Multiple Vitamin (MULTIVITAMIN WITH MINERALS) TABS Take 1 tablet by mouth daily.      . polyethylene glycol powder (GLYCOLAX/MIRALAX) powder Take 17 g by mouth daily.  3350 g  3  . senna-docusate (SENOKOT-S) 8.6-50 MG per tablet Take 1 tablet by mouth daily.      . simvastatin (ZOCOR) 5 MG tablet Take 1 tablet (5 mg total) by mouth at bedtime.  90 tablet  1   No current facility-administered medications on file prior to visit.    Physical exam BP 130/64  Pulse 56  Temp(Src) 97.4 F (36.3 C) (Oral)  Wt 141 lb (63.957 kg)  SpO2 97%  gen- elderly female in NAD HEENT- no pallor, no icterus, no LAD, MMM cvs- ns 1,s2, rrr respi- CTAB abdo- bs+, soft, non tender, no flank tenderness Ext- able to move all 4, cane for walking, good dorsalis pedis Skin- warm and dry, stasis changes in lower legs Psych- aao x2, baseline confusion  Labs- CBC  Component Value Date/Time   WBC 6.4 10/30/2013 1223   WBC 5.9 11/21/2012 0640   RBC 3.31* 10/30/2013 1223   RBC 3.49* 11/21/2012 0640   HGB 9.9* 10/30/2013 1223   HCT 30.0* 10/30/2013 1223   PLT 251 11/21/2012 0640   MCV 91 10/30/2013 1223   MCH 29.9 10/30/2013 1223   MCH 30.7 11/21/2012 0640   MCHC 33.0 10/30/2013 1223   MCHC 32.7 11/21/2012 0640   RDW 14.5 10/30/2013 1223   RDW 13.6 11/21/2012 0640   LYMPHSABS 1.6 10/30/2013 1223   LYMPHSABS 1.2 10/25/2012 0501   MONOABS 0.4 10/25/2012 0501   EOSABS 0.1 10/30/2013 1223   EOSABS 0.1 10/25/2012 0501   BASOSABS 0.0 10/30/2013 1223   BASOSABS 0.0 10/25/2012 0501     CMP     Component Value Date/Time   NA 139 10/30/2013 1223   NA 136 11/21/2012 0640   K 5.0 10/30/2013 1223   CL 104 10/30/2013 1223   CO2 18 10/30/2013 1223   GLUCOSE 91 10/30/2013 1223   GLUCOSE 105* 11/21/2012 0640   BUN 53* 10/30/2013 1223   BUN 57* 11/21/2012 0640   CREATININE 2.45* 10/30/2013 1223   CALCIUM 9.4 10/30/2013 1223   PROT 6.5 10/30/2013 1223   PROT 7.0 02/28/2012 2010   ALBUMIN 3.1* 02/28/2012 2010   AST 12 10/30/2013 1223   ALT 8 10/30/2013 1223   ALKPHOS 69 10/30/2013 1223   BILITOT <0.2 10/30/2013 1223   GFRNONAA 17* 10/30/2013 1223   GFRAA 19* 10/30/2013 1223    Assessment/plan  1. Unspecified constipation Imporved, continue miralax and senna-s. Encouraged hydration  2. Rectal polyp, excised 11/22/2012 stable  3. Alzheimer's disease Off all medications. Persists. Weight stable. No falls, monitor skin care  4. Other and unspecified hyperlipidemia Continue zocor for now with aspirin  5. Anemia Recheck cbc for now. Continue MVI - CBC with Differential - Basic Metabolic Panel  6. Chronic kidney disease (CKD), stage III (moderate) Recheck renal function. Avoid NSAIDs. Continue hydration - Basic Metabolic Panel

## 2014-02-05 LAB — BASIC METABOLIC PANEL
BUN/Creatinine Ratio: 23 (ref 11–26)
BUN: 70 mg/dL — AB (ref 10–36)
CALCIUM: 9.3 mg/dL (ref 8.7–10.3)
CO2: 18 mmol/L (ref 18–29)
CREATININE: 3 mg/dL — AB (ref 0.57–1.00)
Chloride: 107 mmol/L (ref 97–108)
GFR calc Af Amer: 15 mL/min/{1.73_m2} — ABNORMAL LOW (ref >59)
GFR, EST NON AFRICAN AMERICAN: 13 mL/min/{1.73_m2} — AB (ref >59)
GLUCOSE: 89 mg/dL (ref 65–99)
Potassium: 5.1 mmol/L (ref 3.5–5.2)
Sodium: 141 mmol/L (ref 134–144)

## 2014-02-05 LAB — CBC WITH DIFFERENTIAL/PLATELET
BASOS: 1 %
Basophils Absolute: 0 10*3/uL (ref 0.0–0.2)
EOS: 3 %
Eosinophils Absolute: 0.2 10*3/uL (ref 0.0–0.4)
HCT: 30.1 % — ABNORMAL LOW (ref 34.0–46.6)
Hemoglobin: 10 g/dL — ABNORMAL LOW (ref 11.1–15.9)
IMMATURE GRANS (ABS): 0 10*3/uL (ref 0.0–0.1)
Immature Granulocytes: 0 %
LYMPHS: 26 %
Lymphocytes Absolute: 1.7 10*3/uL (ref 0.7–3.1)
MCH: 30.4 pg (ref 26.6–33.0)
MCHC: 33.2 g/dL (ref 31.5–35.7)
MCV: 92 fL (ref 79–97)
MONOS ABS: 0.4 10*3/uL (ref 0.1–0.9)
Monocytes: 6 %
NEUTROS PCT: 64 %
Neutrophils Absolute: 4 10*3/uL (ref 1.4–7.0)
RBC: 3.29 x10E6/uL — AB (ref 3.77–5.28)
RDW: 13.8 % (ref 12.3–15.4)
WBC: 6.3 10*3/uL (ref 3.4–10.8)

## 2014-02-07 ENCOUNTER — Other Ambulatory Visit: Payer: Self-pay | Admitting: Internal Medicine

## 2014-02-07 DIAGNOSIS — N189 Chronic kidney disease, unspecified: Secondary | ICD-10-CM

## 2014-04-25 ENCOUNTER — Other Ambulatory Visit: Payer: Self-pay | Admitting: Internal Medicine

## 2014-05-07 ENCOUNTER — Other Ambulatory Visit: Payer: Self-pay | Admitting: Internal Medicine

## 2014-06-10 ENCOUNTER — Encounter: Payer: Self-pay | Admitting: Internal Medicine

## 2014-06-10 ENCOUNTER — Ambulatory Visit (INDEPENDENT_AMBULATORY_CARE_PROVIDER_SITE_OTHER): Payer: Medicare Other | Admitting: Internal Medicine

## 2014-06-10 ENCOUNTER — Ambulatory Visit: Payer: Medicare Other | Admitting: Internal Medicine

## 2014-06-10 VITALS — BP 124/62 | HR 73 | Temp 98.0°F | Wt 145.0 lb

## 2014-06-10 DIAGNOSIS — E785 Hyperlipidemia, unspecified: Secondary | ICD-10-CM

## 2014-06-10 DIAGNOSIS — N8111 Cystocele, midline: Secondary | ICD-10-CM

## 2014-06-10 DIAGNOSIS — Z8781 Personal history of (healed) traumatic fracture: Secondary | ICD-10-CM

## 2014-06-10 DIAGNOSIS — N8110 Cystocele, unspecified: Secondary | ICD-10-CM

## 2014-06-10 DIAGNOSIS — D638 Anemia in other chronic diseases classified elsewhere: Secondary | ICD-10-CM | POA: Insufficient documentation

## 2014-06-10 DIAGNOSIS — E2839 Other primary ovarian failure: Secondary | ICD-10-CM

## 2014-06-10 DIAGNOSIS — K5900 Constipation, unspecified: Secondary | ICD-10-CM

## 2014-06-10 DIAGNOSIS — G309 Alzheimer's disease, unspecified: Secondary | ICD-10-CM

## 2014-06-10 DIAGNOSIS — N183 Chronic kidney disease, stage 3 unspecified: Secondary | ICD-10-CM

## 2014-06-10 DIAGNOSIS — S72009S Fracture of unspecified part of neck of unspecified femur, sequela: Secondary | ICD-10-CM

## 2014-06-10 DIAGNOSIS — F0280 Dementia in other diseases classified elsewhere without behavioral disturbance: Secondary | ICD-10-CM

## 2014-06-10 NOTE — Progress Notes (Signed)
Patient ID: [name], female   DOB: 1919/09/29, 80 y.o.   MRN: [id number]    Chief Complaint  Patient presents with  . Medical Management of Chronic Issues    4 month follow-up    No Known Allergies  HPI 80 y/o female patient here for follow up with her son. She continues to live by herself and her son is involved in her care.   Her skin issue has resolved.  Has dementia improved bowel movement with miralax, no further rectal bleed reported  Review of Systems   Constitutional: Negative for fever, chills and weight loss.   HENT: Negative for congestion and sore throat.    Eyes: Negative for blurred vision.   Respiratory: Negative for cough and shortness of breath.    Cardiovascular: Negative for chest pain, palpitations, orthopnea and leg swelling.   Gastrointestinal: Negative for heartburn, nausea, vomiting, abdominal pain and blood in stool.   Genitourinary: Negative for dysuria, urgency and frequency.   Musculoskeletal: Positive for joint pain. Negative for myalgias and falls.    Neurological: Negative for dizziness, seizures, loss of consciousness, weakness and headaches.   Psychiatric/Behavioral: Negative for depression.   Past Medical History  Diagnosis Date  . Prolapsed bladder   . Rectal prolapse   . Hyperlipidemia   . Chronic kidney disease (CKD), stage III (moderate) 06/27/2012  . Bladder prolapse, female, acquired 06/27/2012  . Prolapsed internal hemorrhoids 06/27/2012  . Unspecified vitamin D deficiency   . Alzheimer's disease   . Anemia   . Hydronephrosis   . Personal history of fall    Current Outpatient Prescriptions on File Prior to Visit  Medication Sig Dispense Refill  . aspirin EC 81 MG tablet Take 81 mg by mouth daily.      . Multiple Vitamin (MULTIVITAMIN WITH MINERALS) TABS Take 1 tablet by mouth daily.      . polyethylene glycol powder (GLYCOLAX/MIRALAX) powder TAKE 17 GRAMS DAILY. ( MIX WITH WATER, JUICE, SODA, TEA OR COFFEE. )  527 g  5  .  senna-docusate (SENOKOT-S) 8.6-50 MG per tablet Take 1 tablet by mouth daily.      . simvastatin (ZOCOR) 5 MG tablet TAKE ONE TABLET AT BEDTIME.  90 tablet  0   No current facility-administered medications on file prior to visit.    Physical exam BP 124/62  Pulse 73  Temp(Src) 98 F (36.7 C) (Oral)  Wt 145 lb (65.772 kg)  SpO2 92%  General- elderly female in no acute distress, overweight Head- atraumatic, normocephalic Eyes- PERRLA, EOMI, no pallor, no icterus, no discharge Mouth- normal mucus membrane Cardiovascular- normal s1,s2, no murmurs Respiratory- bilateral clear to auscultation, no wheeze, no rhonchi, no crackles Abdomen- bowel sounds present, soft, non tender Musculoskeletal- able to move all 4 extremities, has non pitting right leg edema, no left leg edema Neurological- no focal deficit Skin- warm and dry Psychiatry- alert and oriented to person, place, normal mood and affect  Wt Readings from Last 3 Encounters:  06/10/14 145 lb (65.772 kg)  02/04/14 141 lb (63.957 kg)  11/06/13 142 lb (64.411 kg)    Lab Results  Component Value Date   WBC 6.3 02/04/2014   HGB 10.0* 02/04/2014   HCT 30.1* 02/04/2014   MCV 92 02/04/2014   PLT 251 11/21/2012   Lipid Panel     Component Value Date/Time   TRIG 178* 07/31/2013 1251   HDL 52 07/31/2013 1251   CHOLHDL 4.5* 07/31/2013 1251   LDLCALC 145* 07/31/2013 1251  CMP     Component Value Date/Time   NA 141 02/04/2014 1122   NA 136 11/21/2012 0640   K 5.1 02/04/2014 1122   CL 107 02/04/2014 1122   CO2 18 02/04/2014 1122   GLUCOSE 89 02/04/2014 1122   GLUCOSE 105* 11/21/2012 0640   BUN 70* 02/04/2014 1122   BUN 57* 11/21/2012 0640   CREATININE 3.00* 02/04/2014 1122   CALCIUM 9.3 02/04/2014 1122   PROT 6.5 10/30/2013 1223   PROT 7.0 02/28/2012 2010   ALBUMIN 3.1* 02/28/2012 2010   AST 12 10/30/2013 1223   ALT 8 10/30/2013 1223   ALKPHOS 69 10/30/2013 1223   BILITOT <0.2 10/30/2013 1223   GFRNONAA 13* 02/04/2014 1122   GFRAA 15*  02/04/2014 1122   Assessment/plan  1. Chronic kidney disease (CKD), stage III (moderate) Avoid NSAIDs. bp controlled. Encouraged hydration. Monitor kidney function - CBC with Differential - CMP  2. Anemia of chronic disease From her CKD. Monitor h&h - CBC with Differential  3. Hyperlipidemia Recheck lipid panel, continue zocor - Lipid Panel  4. Bladder prolapse, female, acquired Conservative management for now.  5. Alzheimer's disease Off all medication due to intolerance. Decline anticipated. No new behavior changes. Monitor clinically  6. Unspecified constipation Continue miralax and senna-s  7. Hip fracture requiring operative repair, unspecified laterality, sequela Get dexa scan to assess for osteoporosis  8. History of fracture See above. Taking her ca-vit d in multivitamin for now - DG Bone Density; Future  9. Estrogen deficiency Bone scan ordered. See above - DG Bone Density; Future

## 2014-06-11 LAB — COMPREHENSIVE METABOLIC PANEL
ALBUMIN: 3.8 g/dL (ref 3.2–4.6)
ALK PHOS: 82 IU/L (ref 39–117)
ALT: 7 IU/L (ref 0–32)
AST: 12 IU/L (ref 0–40)
Albumin/Globulin Ratio: 1.3 (ref 1.1–2.5)
BUN / CREAT RATIO: 19 (ref 11–26)
BUN: 58 mg/dL — AB (ref 10–36)
CO2: 18 mmol/L (ref 18–29)
CREATININE: 3.02 mg/dL — AB (ref 0.57–1.00)
Calcium: 9.4 mg/dL (ref 8.7–10.3)
Chloride: 103 mmol/L (ref 97–108)
GFR calc non Af Amer: 13 mL/min/{1.73_m2} — ABNORMAL LOW (ref >59)
GFR, EST AFRICAN AMERICAN: 15 mL/min/{1.73_m2} — AB (ref >59)
GLOBULIN, TOTAL: 2.9 g/dL (ref 1.5–4.5)
GLUCOSE: 89 mg/dL (ref 65–99)
Potassium: 5.2 mmol/L (ref 3.5–5.2)
Sodium: 136 mmol/L (ref 134–144)
TOTAL PROTEIN: 6.7 g/dL (ref 6.0–8.5)
Total Bilirubin: 0.2 mg/dL (ref 0.0–1.2)

## 2014-06-11 LAB — CBC WITH DIFFERENTIAL/PLATELET
Basophils Absolute: 0 10*3/uL (ref 0.0–0.2)
Basos: 1 %
EOS: 3 %
Eosinophils Absolute: 0.1 10*3/uL (ref 0.0–0.4)
HEMATOCRIT: 29.6 % — AB (ref 34.0–46.6)
HEMOGLOBIN: 10 g/dL — AB (ref 11.1–15.9)
IMMATURE GRANULOCYTES: 0 %
Immature Grans (Abs): 0 10*3/uL (ref 0.0–0.1)
LYMPHS ABS: 1.4 10*3/uL (ref 0.7–3.1)
Lymphs: 30 %
MCH: 31.2 pg (ref 26.6–33.0)
MCHC: 33.8 g/dL (ref 31.5–35.7)
MCV: 92 fL (ref 79–97)
MONOCYTES: 12 %
Monocytes Absolute: 0.6 10*3/uL (ref 0.1–0.9)
NEUTROS ABS: 2.4 10*3/uL (ref 1.4–7.0)
Neutrophils Relative %: 54 %
RBC: 3.21 x10E6/uL — ABNORMAL LOW (ref 3.77–5.28)
RDW: 14 % (ref 12.3–15.4)
WBC: 4.5 10*3/uL (ref 3.4–10.8)

## 2014-06-11 LAB — LIPID PANEL
CHOL/HDL RATIO: 3.6 ratio (ref 0.0–4.4)
Cholesterol, Total: 201 mg/dL — ABNORMAL HIGH (ref 100–199)
HDL: 56 mg/dL (ref >39)
LDL CALC: 121 mg/dL — AB (ref 0–99)
TRIGLYCERIDES: 122 mg/dL (ref 0–149)
VLDL CHOLESTEROL CAL: 24 mg/dL (ref 5–40)

## 2014-06-12 LAB — SPECIMEN STATUS REPORT

## 2014-06-13 LAB — SPECIMEN STATUS REPORT

## 2014-06-13 LAB — ERYTHROPOIETIN: Erythropoietin: 8.6 m[IU]/mL (ref 2.6–18.5)

## 2014-06-19 ENCOUNTER — Ambulatory Visit
Admission: RE | Admit: 2014-06-19 | Discharge: 2014-06-19 | Disposition: A | Discharge status: Home / Self Care | Payer: Medicare Other | Source: Ambulatory Visit | Attending: Internal Medicine | Admitting: Internal Medicine

## 2014-06-19 DIAGNOSIS — E2839 Other primary ovarian failure: Secondary | ICD-10-CM

## 2014-06-19 DIAGNOSIS — Z8781 Personal history of (healed) traumatic fracture: Secondary | ICD-10-CM

## 2014-06-19 LAB — HM DEXA SCAN

## 2014-06-24 ENCOUNTER — Encounter: Payer: Self-pay | Admitting: *Deleted

## 2014-06-25 ENCOUNTER — Other Ambulatory Visit: Payer: Self-pay | Admitting: *Deleted

## 2014-06-25 ENCOUNTER — Encounter: Payer: Self-pay | Admitting: *Deleted

## 2014-06-25 MED ORDER — ALENDRONATE SODIUM 70 MG PO TABS: 70.0000 mg | ORAL_TABLET | ORAL | Status: DC

## 2014-06-25 NOTE — Telephone Encounter (Signed)
Your bone scan shows thinned out bones called osteoporosis. i will need to start you on fosamax 70 mg once a week (empty stomach with a tall glass of water and do not lie down for an hour after taking it). You also need adequate intake of calcium and vitamin d in your diet. You can take calcium-vitamin d supplement like oscal 600-400 one tablet twice a day.

## 2014-07-23 ENCOUNTER — Other Ambulatory Visit: Payer: Self-pay | Admitting: Internal Medicine

## 2014-09-15 ENCOUNTER — Other Ambulatory Visit: Payer: Self-pay | Admitting: Internal Medicine

## 2014-10-15 ENCOUNTER — Ambulatory Visit (INDEPENDENT_AMBULATORY_CARE_PROVIDER_SITE_OTHER): Payer: Medicare Other | Admitting: Internal Medicine

## 2014-10-15 ENCOUNTER — Encounter: Payer: Self-pay | Admitting: Internal Medicine

## 2014-10-15 VITALS — BP 130/60 | HR 82 | Temp 98.4°F | Wt 140.8 lb

## 2014-10-15 DIAGNOSIS — G309 Alzheimer's disease, unspecified: Secondary | ICD-10-CM

## 2014-10-15 DIAGNOSIS — F0280 Dementia in other diseases classified elsewhere without behavioral disturbance: Secondary | ICD-10-CM

## 2014-10-15 DIAGNOSIS — D638 Anemia in other chronic diseases classified elsewhere: Secondary | ICD-10-CM

## 2014-10-15 DIAGNOSIS — K5901 Slow transit constipation: Secondary | ICD-10-CM

## 2014-10-15 DIAGNOSIS — N183 Chronic kidney disease, stage 3 unspecified: Secondary | ICD-10-CM

## 2014-10-15 DIAGNOSIS — N8110 Cystocele, unspecified: Secondary | ICD-10-CM

## 2014-10-15 DIAGNOSIS — E785 Hyperlipidemia, unspecified: Secondary | ICD-10-CM

## 2014-10-15 NOTE — Progress Notes (Signed)
Patient ID: [name], female   DOB: [birth year], 80 y.o.   MRN: [id number]    Chief Complaint  Patient presents with  . Follow-up    4 month follow up , pt. need's a referral to home health aide   No Known Allergies  HPI 80 y/o female patient here for follow up with her son. She continues to live by herself and her son is involved in her care. she has been having problem with her bladder prolapse but refuses to see a urologist. Denies burning or pain with urination but as per son she is holding her self in front while walking. She has been refusing to clean herself and has poor self hygiene for past few months now. no falls reported. No new skin issue. Taking her meds. Has regular bowel movement, using stool softener Has dementia  Review of Systems   Constitutional: Negative for fever, chills and weight loss.   HENT: Negative for congestion and sore throat.    Eyes: Negative for blurred vision.   Respiratory: Negative for cough and shortness of breath.    Cardiovascular: Negative for chest pain, palpitations, orthopnea and leg swelling.   Gastrointestinal: Negative for heartburn, nausea, vomiting, abdominal pain and blood in stool.   Genitourinary: Negative for dysuria   Musculoskeletal: Negative for myalgias and falls.    Neurological: Negative for dizziness, seizures, loss of consciousness, weakness and headaches.   Psychiatric/Behavioral: Negative for depression.   Past Medical History  Diagnosis Date  . Prolapsed bladder   . Rectal prolapse   . Hyperlipidemia   . Chronic kidney disease (CKD), stage III (moderate) 06/27/2012  . Bladder prolapse, female, acquired 06/27/2012  . Prolapsed internal hemorrhoids 06/27/2012  . Unspecified vitamin D deficiency   . Alzheimer's disease   . Anemia   . Hydronephrosis   . Personal history of fall    Current Outpatient Prescriptions on File Prior to Visit  Medication Sig Dispense Refill  . alendronate (FOSAMAX) 70 MG tablet TAKE 1 TABLET  BY MOUTH ONCE WEEKLY. TAKE WITH A FULL GLASS OF WATER ON AN EMPTY STOMACH. 4 tablet 5  . aspirin EC 81 MG tablet Take 81 mg by mouth daily.    . Multiple Vitamin (MULTIVITAMIN WITH MINERALS) TABS Take 1 tablet by mouth daily.    . polyethylene glycol powder (GLYCOLAX/MIRALAX) powder TAKE 17 GRAMS DAILY. ( MIX WITH WATER, JUICE, SODA, TEA OR COFFEE. ) 527 g 5  . senna-docusate (SENOKOT-S) 8.6-50 MG per tablet Take 1 tablet by mouth daily.    . simvastatin (ZOCOR) 5 MG tablet TAKE ONE TABLET AT BEDTIME. 90 tablet 1   No current facility-administered medications on file prior to visit.    Physical exam BP 130/60 mmHg  Pulse 82  Temp(Src) 98.4 F (36.9 C) (Oral)  Wt 140 lb 12.8 oz (63.866 kg)  SpO2 98%  Wt Readings from Last 3 Encounters:  10/15/14 140 lb 12.8 oz (63.866 kg)  06/10/14 145 lb (65.772 kg)  02/04/14 141 lb (63.957 kg)   General- elderly female in no acute distress, overweight, has a body odor Head- atraumatic, normocephalic Eyes- PERRLA, EOMI, no pallor, no icterus, no discharge Mouth- normal mucus membrane Cardiovascular- normal s1,s2, no murmurs Respiratory- bilateral clear to auscultation, no wheeze, no rhonchi, no crackles Abdomen- bowel sounds present, soft, non tender, refuses pelvic exam but has her hand on her vaginal area Musculoskeletal- able to move all 4 extremities, has non pitting right leg edema, no left leg edema Neurological- no  focal deficit Skin- warm and dry Psychiatry- alert and oriented to person, place, normal mood and affect  Labs  CBC Latest Ref Rng 06/10/2014 02/04/2014 10/30/2013  WBC 3.4 - 10.8 x10E3/uL 4.5 6.3 6.4  Hemoglobin 11.1 - 15.9 g/dL 10.0(L) 10.0(L) 9.9(L)  Hematocrit 34.0 - 46.6 % 29.6(L) 30.1(L) 30.0(L)  Platelets 150 - 400 K/uL - - -   CMP     Component Value Date/Time   NA 136 06/10/2014 1210   NA 136 11/21/2012 0640   K 5.2 06/10/2014 1210   CL 103 06/10/2014 1210   CO2 18 06/10/2014 1210   GLUCOSE 89 06/10/2014 1210    GLUCOSE 105* 11/21/2012 0640   BUN 58* 06/10/2014 1210   BUN 57* 11/21/2012 0640   CREATININE 3.02* 06/10/2014 1210   CALCIUM 9.4 06/10/2014 1210   PROT 6.7 06/10/2014 1210   PROT 7.0 02/28/2012 2010   ALBUMIN 3.1* 02/28/2012 2010   AST 12 06/10/2014 1210   ALT 7 06/10/2014 1210   ALKPHOS 82 06/10/2014 1210   BILITOT 0.2 06/10/2014 1210   GFRNONAA 13* 06/10/2014 1210   GFRAA 15* 06/10/2014 1210   Lipid Panel     Component Value Date/Time   TRIG 122 06/10/2014 1210   HDL 56 06/10/2014 1210   CHOLHDL 3.6 06/10/2014 1210   LDLCALC 121* 06/10/2014 1210   Assessment/plan  1. Alzheimer's disease Persists, pleasant to conversation. As per son increased need for assistance at home with ADLs with safety concerns. Will have home health do home situation evaluation and provide nursing aid if needed - Ambulatory referral to [facility]  2. Slow transit constipation continute her otc stool softener  3. Bladder prolapse, female, acquired Recommended urology visit (has one) to consult on choice for pessary vs surgical option vs observation  4. Chronic kidney disease (CKD), stage III (moderate) Monitor renal fucntion - Basic Metabolic Panel  5. Hyperlipidemia Continue zocor  6. Anemia of chronic disease No further bleed, monitor h&h - CBC with Differential

## 2014-10-16 LAB — BASIC METABOLIC PANEL
BUN / CREAT RATIO: 23 (ref 11–26)
BUN: 66 mg/dL — AB (ref 10–36)
CO2: 20 mmol/L (ref 18–29)
CREATININE: 2.85 mg/dL — AB (ref 0.57–1.00)
Calcium: 10.4 mg/dL — ABNORMAL HIGH (ref 8.7–10.3)
Chloride: 99 mmol/L (ref 97–108)
GFR, EST AFRICAN AMERICAN: 16 mL/min/{1.73_m2} — AB (ref >59)
GFR, EST NON AFRICAN AMERICAN: 14 mL/min/{1.73_m2} — AB (ref >59)
Glucose: 81 mg/dL (ref 65–99)
Potassium: 5 mmol/L (ref 3.5–5.2)
SODIUM: 135 mmol/L (ref 134–144)

## 2014-10-16 LAB — CBC WITH DIFFERENTIAL/PLATELET
BASOS ABS: 0 10*3/uL (ref 0.0–0.2)
BASOS: 0 %
EOS ABS: 0.1 10*3/uL (ref 0.0–0.4)
Eos: 2 %
HEMATOCRIT: 30.1 % — AB (ref 34.0–46.6)
HEMOGLOBIN: 9.9 g/dL — AB (ref 11.1–15.9)
Immature Grans (Abs): 0 10*3/uL (ref 0.0–0.1)
Immature Granulocytes: 0 %
LYMPHS ABS: 2 10*3/uL (ref 0.7–3.1)
Lymphs: 31 %
MCH: 30 pg (ref 26.6–33.0)
MCHC: 32.9 g/dL (ref 31.5–35.7)
MCV: 91 fL (ref 79–97)
MONOS ABS: 0.5 10*3/uL (ref 0.1–0.9)
Monocytes: 7 %
NEUTROS ABS: 4 10*3/uL (ref 1.4–7.0)
Neutrophils Relative %: 60 %
RBC: 3.3 x10E6/uL — AB (ref 3.77–5.28)
RDW: 13.9 % (ref 12.3–15.4)
WBC: 6.6 10*3/uL (ref 3.4–10.8)

## 2014-10-19 DIAGNOSIS — N8181 Perineocele: Secondary | ICD-10-CM

## 2014-10-19 DIAGNOSIS — N183 Chronic kidney disease, stage 3 (moderate): Secondary | ICD-10-CM

## 2014-10-19 DIAGNOSIS — G309 Alzheimer's disease, unspecified: Secondary | ICD-10-CM

## 2014-10-19 DIAGNOSIS — F0280 Dementia in other diseases classified elsewhere without behavioral disturbance: Secondary | ICD-10-CM

## 2014-12-01 ENCOUNTER — Encounter: Payer: Self-pay | Admitting: Nurse Practitioner

## 2014-12-01 ENCOUNTER — Ambulatory Visit (INDEPENDENT_AMBULATORY_CARE_PROVIDER_SITE_OTHER): Payer: Medicare Other | Admitting: Nurse Practitioner

## 2014-12-01 VITALS — BP 144/76 | HR 80 | Temp 98.2°F | Wt 148.0 lb

## 2014-12-01 DIAGNOSIS — L03116 Cellulitis of left lower limb: Secondary | ICD-10-CM

## 2014-12-01 MED ORDER — DOXYCYCLINE HYCLATE 100 MG PO TABS: 100.0000 mg | ORAL_TABLET | Freq: Two times a day (BID) | ORAL | Status: DC

## 2014-12-01 NOTE — Patient Instructions (Signed)
To start DOXYCYCLINE 100 mg by mouth twice daily for 10 days.  Take with food May take florastor twice daily while on antibiotic for GI health Seek medical attention if leg has not improved or getting worse   Cellulitis Cellulitis is an infection of the skin and the tissue beneath it. The infected area is usually red and tender. Cellulitis occurs most often in the arms and lower legs.  CAUSES  Cellulitis is caused by bacteria that enter the skin through cracks or cuts in the skin. The most common types of bacteria that cause cellulitis are staphylococci and streptococci. SIGNS AND SYMPTOMS   Redness and warmth.  Swelling.  Tenderness or pain.  Fever. DIAGNOSIS  Your health care provider can usually determine what is wrong based on a physical exam. Blood tests may also be done. TREATMENT  Treatment usually involves taking an antibiotic medicine. HOME CARE INSTRUCTIONS   Take your antibiotic medicine as directed by your health care provider. Finish the antibiotic even if you start to feel better.  Keep the infected arm or leg elevated to reduce swelling.  Apply a warm cloth to the affected area up to 4 times per day to relieve pain.  Take medicines only as directed by your health care provider.  Keep all follow-up visits as directed by your health care provider. SEEK MEDICAL CARE IF:   You notice red streaks coming from the infected area.  Your red area gets larger or turns dark in color.  Your bone or joint underneath the infected area becomes painful after the skin has healed.  Your infection returns in the same area or another area.  You notice a swollen bump in the infected area.  You develop new symptoms.  You have a fever. SEEK IMMEDIATE MEDICAL CARE IF:   You feel very sleepy.  You develop vomiting or diarrhea.  You have a general ill feeling (malaise) with muscle aches and pains. MAKE SURE YOU:   Understand these instructions.  Will watch your  condition.  Will get help right away if you are not doing well or get worse. Document Released: 08/24/2005 Document Revised: 03/31/2014 Document Reviewed: 01/30/2012 [facility] Patient Information 2015 [place], [place]. This information is not intended to replace advice given to you by your health care provider. Make sure you discuss any questions you have with your health care provider.

## 2014-12-01 NOTE — Progress Notes (Signed)
Patient ID: [name], female   DOB: 1919/05/20, 81 y.o.   MRN: [id number]    PCP: Blanchie Serve, MD  No Known Allergies  Chief Complaint  Patient presents with  . sore on shin    left lower leg for 2 days. Patient doesn't remember hitting leg. Here with son [name]     HPI: Patient is a 81 y.o. female seen in the office today for evaluation of sore on shin. Son here with pt today. Pt lives alone and son comes over to help. Son believes she scratched it while she was sleeping but pt reports it just popped up. Hx of how she got the scrape is unclear. Son reports there was a blister below sore that has now popped. No drainage at this time Pt with memory loss so son providing most of the history.  No fevers or chills.  Bilateral swelling.  Review of Systems:  Review of Systems  Constitutional: Negative for activity change, appetite change, fatigue and unexpected weight change.  HENT: Negative for congestion and hearing loss.   Eyes: Negative.   Respiratory: Negative for cough and shortness of breath.   Cardiovascular: Positive for leg swelling (bileratl LE edema). Negative for chest pain and palpitations.  Gastrointestinal: Negative for abdominal pain, diarrhea and constipation.  Genitourinary: Negative for dysuria and difficulty urinating.  Musculoskeletal: Positive for myalgias (pain in left leg ).  Skin: Negative for color change and wound.  Neurological: Negative for dizziness and weakness.  Psychiatric/Behavioral: Positive for confusion (confusion is stable, no worsening of STML).    Past Medical History  Diagnosis Date  . Prolapsed bladder   . Rectal prolapse   . Hyperlipidemia   . Chronic kidney disease (CKD), stage III (moderate) 06/27/2012  . Bladder prolapse, female, acquired 06/27/2012  . Prolapsed internal hemorrhoids 06/27/2012  . Unspecified vitamin D deficiency   . Alzheimer's disease   . Anemia   . Hydronephrosis   . Personal history of fall    Past Surgical  History  Procedure Laterality Date  . Appendectomy    . Joint replacement      lt hip  . Cataract extraction w/ intraocular lens  implant, bilateral    . Colonoscopy  11/22/2012    Procedure: COLONOSCOPY;  Surgeon: Shann Medal, MD;  Location: [facility];  Service: General;  Laterality: N/A;  . Polypectomy  11/22/2012    Procedure: POLYPECTOMY;  Surgeon: Shann Medal, MD;  Location: [facility];  Service: General;;  . Bilateral wrist surgeries     Social History:   reports that she has never smoked. She has never used smokeless tobacco. She reports that she does not drink alcohol or use illicit drugs.  Family History  Problem Relation Age of Onset  . Cancer Sister     breast    Medications: Patient's Medications  New Prescriptions   No medications on file  Previous Medications   ALENDRONATE (FOSAMAX) 70 MG TABLET    TAKE 1 TABLET BY MOUTH ONCE WEEKLY. TAKE WITH A FULL GLASS OF WATER ON AN EMPTY STOMACH.   ASPIRIN EC 81 MG TABLET    Take 81 mg by mouth daily.   MULTIPLE VITAMIN (MULTIVITAMIN WITH MINERALS) TABS    Take 1 tablet by mouth daily.   POLYETHYLENE GLYCOL POWDER (GLYCOLAX/MIRALAX) POWDER    TAKE 17 GRAMS DAILY. ( MIX WITH WATER, JUICE, SODA, TEA OR COFFEE. )   SENNA-DOCUSATE (SENOKOT-S) 8.6-50 MG PER TABLET    Take 1 tablet by  mouth daily.   SIMVASTATIN (ZOCOR) 5 MG TABLET    TAKE ONE TABLET AT BEDTIME.  Modified Medications   No medications on file  Discontinued Medications   No medications on file     Physical Exam:  Filed Vitals:   12/01/14 1541  BP: 144/76  Pulse: 80  Temp: 98.2 F (36.8 C)  TempSrc: Oral  Weight: 148 lb (67.132 kg)  SpO2: 93%    Physical Exam  Constitutional: She is oriented to person, place, and time. She appears well-developed and well-nourished. No distress.  HENT:  Head: Normocephalic and atraumatic.  Mouth/Throat: Oropharynx is clear and moist. No oropharyngeal exudate.  Eyes: Conjunctivae are normal. Pupils are equal, round, and  reactive to light.  Neck: Normal range of motion. Neck supple.  Cardiovascular: Normal rate, regular rhythm and normal heart sounds.   Pulmonary/Chest: Effort normal and breath sounds normal.  Abdominal: Soft. Bowel sounds are normal.  Musculoskeletal: She exhibits no edema or tenderness.  Neurological: She is alert and oriented to person, place, and time.  Skin: Skin is warm and dry. Abrasion noted. She is not diaphoretic. There is erythema.  0.5 x 0.5 cm abrasion noted to left shin, below area is similar sized blister that has drained. Leg with increased redness compared to right leg +1-2 LE edema     Labs reviewed: Basic Metabolic Panel:  Recent Labs  02/04/14 1122 06/10/14 1210 10/15/14 1213  NA 141 136 135  K 5.1 5.2 5.0  CL 107 103 99  CO2 18 18 20   GLUCOSE 89 89 81  BUN 70* 58* 66*  CREATININE 3.00* 3.02* 2.85*  CALCIUM 9.3 9.4 10.4*   Liver Function Tests:  Recent Labs  06/10/14 1210  AST 12  ALT 7  ALKPHOS 82  BILITOT 0.2  PROT 6.7   No results for input(s): LIPASE, AMYLASE in the last 8760 hours. No results for input(s): AMMONIA in the last 8760 hours. CBC:  Recent Labs  02/04/14 1122 06/10/14 1210 10/15/14 1213  WBC 6.3 4.5 6.6  NEUTROABS 4.0 2.4 4.0  HGB 10.0* 10.0* 9.9*  HCT 30.1* 29.6* 30.1*  MCV 92 92 91   Lipid Panel:  Recent Labs  06/10/14 1210  HDL 56  LDLCALC 121*  TRIG 122  CHOLHDL 3.6   TSH: No results for input(s): TSH in the last 8760 hours. A1C: No results found for: HGBA1C   Assessment/Plan  1. Cellulitis of left lower extremity -from abrasion - doxycycline (VIBRA-TABS) 100 MG tablet; Take 1 tablet (100 mg total) by mouth 2 (two) times daily.  Dispense: 20 tablet; Refill: 0 - to take florastor BID while on antibiotics -to elevate LE -return precautions discussed with son when to seek emergency care if needed, voices understanding

## 2015-01-20 ENCOUNTER — Encounter: Payer: Self-pay | Admitting: Internal Medicine

## 2015-01-30 ENCOUNTER — Other Ambulatory Visit: Payer: Self-pay | Admitting: Internal Medicine

## 2015-04-14 ENCOUNTER — Ambulatory Visit: Payer: Medicare Other | Admitting: Internal Medicine

## 2015-04-15 ENCOUNTER — Ambulatory Visit: Payer: Medicare Other | Admitting: Internal Medicine

## 2015-04-15 ENCOUNTER — Encounter: Payer: Self-pay | Admitting: Internal Medicine

## 2015-04-15 ENCOUNTER — Ambulatory Visit (INDEPENDENT_AMBULATORY_CARE_PROVIDER_SITE_OTHER): Payer: Medicare Other | Admitting: Internal Medicine

## 2015-04-15 VITALS — BP 132/80 | HR 78 | Temp 97.6°F | Ht 60.0 in | Wt 148.2 lb

## 2015-04-15 DIAGNOSIS — H1131 Conjunctival hemorrhage, right eye: Secondary | ICD-10-CM

## 2015-04-15 DIAGNOSIS — H1130 Conjunctival hemorrhage, unspecified eye: Secondary | ICD-10-CM | POA: Insufficient documentation

## 2015-04-15 NOTE — Progress Notes (Signed)
Patient ID: [name], female   DOB: [birth year], 81 y.o.   MRN: [id number]    Facility  [facility]    Place of Service:   OFFICE    No Known Allergies  Chief Complaint  Patient presents with  . Acute Visit    Complains of Red Eye, Concerns of pink eye. Started Monday    HPI:  Onset of a crimson eye 04/13/2015. Painless. Lateral eye was the onset of the bleed. No itching. No loss of vision. Patient has had cataract extraction in the right eye but this was about 15 years ago and has not had any complications. Has not been coughing or sneezing or had any upper respiratory symptoms. Eye is not itching.  Medications: Patient's Medications  New Prescriptions   No medications on file  Previous Medications   ALENDRONATE (FOSAMAX) 70 MG TABLET    TAKE 1 TABLET BY MOUTH ONCE WEEKLY. TAKE WITH A FULL GLASS OF WATER ON AN EMPTY STOMACH.   ASPIRIN EC 81 MG TABLET    Take 81 mg by mouth daily.   MULTIPLE VITAMIN (MULTIVITAMIN WITH MINERALS) TABS    Take 1 tablet by mouth daily.   POLYETHYLENE GLYCOL POWDER (GLYCOLAX/MIRALAX) POWDER    TAKE 17 GRAMS DAILY. ( MIX WITH WATER, JUICE, SODA, TEA OR COFFEE. )   SENNA-DOCUSATE (SENOKOT-S) 8.6-50 MG PER TABLET    Take 1 tablet by mouth daily.   SIMVASTATIN (ZOCOR) 5 MG TABLET    TAKE ONE TABLET AT BEDTIME.  Modified Medications   No medications on file  Discontinued Medications   DOXYCYCLINE (VIBRA-TABS) 100 MG TABLET    Take 1 tablet (100 mg total) by mouth 2 (two) times daily.     Review of Systems  Constitutional: Negative for fever and chills.  HENT: Negative for congestion and sore throat.   Eyes:       New onset of cramps in right eye 2 days ago.  Respiratory: Negative for cough and shortness of breath.   Cardiovascular: Negative for chest pain, palpitations and leg swelling.  Gastrointestinal: Positive for constipation. Negative for nausea, vomiting, abdominal pain and blood in stool.  Genitourinary: Negative for dysuria, urgency and  frequency.  Musculoskeletal: Negative for myalgias.  Skin: Negative for rash.  Neurological: Negative for dizziness, seizures, weakness and headaches.    Filed Vitals:   04/15/15 1144  BP: 132/80  Pulse: 78  Temp: 97.6 F (36.4 C)  TempSrc: Oral  Height: 5' (1.524 m)  Weight: 148 lb 3.2 oz (67.223 kg)   Body mass index is 28.94 kg/(m^2).  Physical Exam  Constitutional: She is oriented to person, place, and time. She appears well-developed and well-nourished. No distress.  HENT:  Head: Normocephalic and atraumatic.  Mouth/Throat: Oropharynx is clear and moist. No oropharyngeal exudate.  Eyes:  Right suconjunctival hemorrhage covering medial and lateral aspects. Prior cataract extractions and intraocular lens implants bilaterally. Mild asymmetry in the pupils with the right side being slightly larger than the left.  Neck: Normal range of motion. Neck supple.  Cardiovascular: Normal rate, regular rhythm and normal heart sounds.   Pulmonary/Chest: Effort normal and breath sounds normal.  Abdominal: Soft. Bowel sounds are normal.  Musculoskeletal: She exhibits no edema or tenderness.  Neurological: She is alert and oriented to person, place, and time.  Skin: Skin is warm and dry. She is not diaphoretic.     Labs reviewed: No visits with results within 3 Month(s) from this visit. Latest known visit with results is:  Office Visit on 10/15/2014  Component Date Value Ref Range Status  . Glucose 10/15/2014 81  65 - 99 mg/dL Final  . BUN 10/15/2014 66* 10 - 36 mg/dL Final  . Creatinine, Ser 10/15/2014 2.85* 0.57 - 1.00 mg/dL Final  . GFR calc non Af Amer 10/15/2014 14* >59 mL/min/1.73 Final  . GFR calc Af Amer 10/15/2014 16* >59 mL/min/1.73 Final  . BUN/Creatinine Ratio 10/15/2014 23  11 - 26 Final  . Sodium 10/15/2014 135  134 - 144 mmol/L Final  . Potassium 10/15/2014 5.0  3.5 - 5.2 mmol/L Final  . Chloride 10/15/2014 99  97 - 108 mmol/L Final  . CO2 10/15/2014 20  18 - 29  mmol/L Final  . Calcium 10/15/2014 10.4* 8.7 - 10.3 mg/dL Final  . WBC 10/15/2014 6.6  3.4 - 10.8 x10E3/uL Final  . RBC 10/15/2014 3.30* 3.77 - 5.28 x10E6/uL Final  . Hemoglobin 10/15/2014 9.9* 11.1 - 15.9 g/dL Final  . HCT 10/15/2014 30.1* 34.0 - 46.6 % Final  . MCV 10/15/2014 91  79 - 97 fL Final  . MCH 10/15/2014 30.0  26.6 - 33.0 pg Final  . MCHC 10/15/2014 32.9  31.5 - 35.7 g/dL Final  . RDW 10/15/2014 13.9  12.3 - 15.4 % Final  . Neutrophils Relative % 10/15/2014 60   Final  . Lymphs 10/15/2014 31   Final  . Monocytes 10/15/2014 7   Final  . Eos 10/15/2014 2   Final  . Basos 10/15/2014 0   Final  . Neutrophils Absolute 10/15/2014 4.0  1.4 - 7.0 x10E3/uL Final  . Lymphocytes Absolute 10/15/2014 2.0  0.7 - 3.1 x10E3/uL Final  . Monocytes Absolute 10/15/2014 0.5  0.1 - 0.9 x10E3/uL Final  . Eosinophils Absolute 10/15/2014 0.1  0.0 - 0.4 x10E3/uL Final  . Basophils Absolute 10/15/2014 0.0  0.0 - 0.2 x10E3/uL Final  . Immature Granulocytes 10/15/2014 0   Final  . Immature Grans (Abs) 10/15/2014 0.0  0.0 - 0.1 x10E3/uL Final     Assessment/Plan  1. Subconjunctival hemorrhage, right Benign problem. Should resolve on its own. No treatments recommended. Patient will return in 05/08/2015 for routine examination by Dr. Eulas Post.

## 2015-04-15 NOTE — Patient Instructions (Signed)
You have had a spontaneous hemorrhage of the subconjunctival area of the right eye. It will get better on its own over the next couple of weeks.

## 2015-05-01 ENCOUNTER — Ambulatory Visit: Payer: Medicare Other | Admitting: Internal Medicine

## 2015-05-08 ENCOUNTER — Encounter: Payer: Self-pay | Admitting: Internal Medicine

## 2015-05-08 ENCOUNTER — Ambulatory Visit (INDEPENDENT_AMBULATORY_CARE_PROVIDER_SITE_OTHER): Payer: Medicare Other | Admitting: Internal Medicine

## 2015-05-08 VITALS — BP 140/78 | HR 80 | Temp 97.5°F | Resp 20 | Ht 60.0 in | Wt 146.2 lb

## 2015-05-08 DIAGNOSIS — E785 Hyperlipidemia, unspecified: Secondary | ICD-10-CM

## 2015-05-08 DIAGNOSIS — N183 Chronic kidney disease, stage 3 unspecified: Secondary | ICD-10-CM

## 2015-05-08 DIAGNOSIS — G309 Alzheimer's disease, unspecified: Secondary | ICD-10-CM | POA: Diagnosis not present

## 2015-05-08 DIAGNOSIS — M810 Age-related osteoporosis without current pathological fracture: Secondary | ICD-10-CM | POA: Diagnosis not present

## 2015-05-08 DIAGNOSIS — F0280 Dementia in other diseases classified elsewhere without behavioral disturbance: Secondary | ICD-10-CM

## 2015-05-08 DIAGNOSIS — N8110 Cystocele, unspecified: Secondary | ICD-10-CM | POA: Diagnosis not present

## 2015-05-08 MED ORDER — ALENDRONATE SODIUM 70 MG PO TABS: ORAL_TABLET | ORAL | Status: DC

## 2015-05-08 NOTE — Patient Instructions (Addendum)
Start fosamax WEEKLY for osteoporosis. Continue calcium with D daily  Continue other medications as ordered  Follow up in 6 mos for CPE

## 2015-05-08 NOTE — Progress Notes (Signed)
Patient ID: [name], female   DOB: [birth year], 81 y.o.   MRN: [id number]    Location:    [facility]   Place of Service:   OFFICE  Chief Complaint  Patient presents with  . Medical Management of Chronic Issues    6 month follow-up    HPI:  81 yo female seen today for f/u. She reports eating okay. She still cooks small meals and son takes her meals frequently.   She has urinary frequency and has bladder prolapse but has declined pessary placement.  She is not taking all meds as Rx. She takes statin for cholesterol.  No issues with constipation as she is taking daily stool softener and miralax  She is not taking fosamax for osteoporosis but is taking Oscal w D. Last DXA in 7/15  Pt is a poor historian due to dementia. Hx obtained from chart and pt's son.  Past Medical History  Diagnosis Date  . Prolapsed bladder   . Rectal prolapse   . Hyperlipidemia   . Chronic kidney disease (CKD), stage III (moderate) 06/27/2012  . Bladder prolapse, female, acquired 06/27/2012  . Prolapsed internal hemorrhoids 06/27/2012  . Unspecified vitamin D deficiency   . Alzheimer's disease   . Anemia   . Hydronephrosis   . Personal history of fall     Past Surgical History  Procedure Laterality Date  . Appendectomy    . Joint replacement      lt hip  . Cataract extraction w/ intraocular lens  implant, bilateral    . Colonoscopy  11/22/2012    Procedure: COLONOSCOPY;  Surgeon: Shann Medal, MD;  Location: [facility];  Service: General;  Laterality: N/A;  . Polypectomy  11/22/2012    Procedure: POLYPECTOMY;  Surgeon: Shann Medal, MD;  Location: [facility];  Service: General;;  . Bilateral wrist surgeries      Patient Care Team: Gildardo Cranker, DO as PCP - General (Internal Medicine) Lowella Bandy, MD as Consulting Physician (Urology)  History   Social History  . Marital Status: Widowed    Spouse Name: N/A  . Number of Children: N/A  . Years of Education: N/A   Occupational History  . Not on file.    Social History Main Topics  . Smoking status: Never Smoker   . Smokeless tobacco: Never Used  . Alcohol Use: No  . Drug Use: No  . Sexual Activity: No   Other Topics Concern  . Not on file   Social History Narrative     reports that she has never smoked. She has never used smokeless tobacco. She reports that she does not drink alcohol or use illicit drugs.  No Known Allergies  Medications: Patient's Medications  New Prescriptions   No medications on file  Previous Medications   ALENDRONATE (FOSAMAX) 70 MG TABLET    TAKE 1 TABLET BY MOUTH ONCE WEEKLY. TAKE WITH A FULL GLASS OF WATER ON AN EMPTY STOMACH.   ASPIRIN EC 81 MG TABLET    Take 81 mg by mouth daily.   MULTIPLE VITAMIN (MULTIVITAMIN WITH MINERALS) TABS    Take 1 tablet by mouth daily.   POLYETHYLENE GLYCOL POWDER (GLYCOLAX/MIRALAX) POWDER    TAKE 17 GRAMS DAILY. ( MIX WITH WATER, JUICE, SODA, TEA OR COFFEE. )   SENNA-DOCUSATE (SENOKOT-S) 8.6-50 MG PER TABLET    Take 1 tablet by mouth daily.   SIMVASTATIN (ZOCOR) 5 MG TABLET    TAKE ONE TABLET AT BEDTIME.  Modified Medications  No medications on file  Discontinued Medications   No medications on file    Review of Systems  Unable to perform ROS: Dementia    Filed Vitals:   05/08/15 1148  BP: 140/78  Pulse: 80  Temp: 97.5 F (36.4 C)  TempSrc: Oral  Resp: 20  Height: 5' (1.524 m)  Weight: 146 lb 3.2 oz (66.316 kg)  SpO2: 93%   Body mass index is 28.55 kg/(m^2).  Physical Exam  Constitutional: She appears well-developed. No distress.  Frail appearing in NAD.  HENT:  Mouth/Throat: Oropharynx is clear and moist. No oropharyngeal exudate.  Eyes: Pupils are equal, round, and reactive to light. No scleral icterus.  No corneal redness or hemorrhage  Neck: Neck supple. Carotid bruit is not present. No tracheal deviation present. No thyromegaly present.  Cardiovascular: Normal rate, regular rhythm, normal heart sounds and intact distal pulses.  Exam reveals  no gallop and no friction rub.   No murmur heard. No LE edema b/l. no calf TTP.   Pulmonary/Chest: Effort normal and breath sounds normal. No stridor. No respiratory distress. She has no wheezes. She has no rales.  Abdominal: Soft. Bowel sounds are normal. She exhibits no distension and no mass. There is no tenderness. There is no rebound and no guarding.  Musculoskeletal: She exhibits edema and tenderness.  Slow gait. Uses cane to ambulate  Lymphadenopathy:    She has no cervical adenopathy.  Neurological: She is alert. She has normal reflexes.  Skin: Skin is warm and dry. No rash noted.  Psychiatric: She has a normal mood and affect. Her behavior is normal.     Labs reviewed: No visits with results within 3 Month(s) from this visit. Latest known visit with results is:  Office Visit on 10/15/2014  Component Date Value Ref Range Status  . Glucose 10/15/2014 81  65 - 99 mg/dL Final  . BUN 10/15/2014 66* 10 - 36 mg/dL Final  . Creatinine, Ser 10/15/2014 2.85* 0.57 - 1.00 mg/dL Final  . GFR calc non Af Amer 10/15/2014 14* >59 mL/min/1.73 Final  . GFR calc Af Amer 10/15/2014 16* >59 mL/min/1.73 Final  . BUN/Creatinine Ratio 10/15/2014 23  11 - 26 Final  . Sodium 10/15/2014 135  134 - 144 mmol/L Final  . Potassium 10/15/2014 5.0  3.5 - 5.2 mmol/L Final  . Chloride 10/15/2014 99  97 - 108 mmol/L Final  . CO2 10/15/2014 20  18 - 29 mmol/L Final  . Calcium 10/15/2014 10.4* 8.7 - 10.3 mg/dL Final  . WBC 10/15/2014 6.6  3.4 - 10.8 x10E3/uL Final  . RBC 10/15/2014 3.30* 3.77 - 5.28 x10E6/uL Final  . Hemoglobin 10/15/2014 9.9* 11.1 - 15.9 g/dL Final  . HCT 10/15/2014 30.1* 34.0 - 46.6 % Final  . MCV 10/15/2014 91  79 - 97 fL Final  . MCH 10/15/2014 30.0  26.6 - 33.0 pg Final  . MCHC 10/15/2014 32.9  31.5 - 35.7 g/dL Final  . RDW 10/15/2014 13.9  12.3 - 15.4 % Final  . Neutrophils Relative % 10/15/2014 60   Final  . Lymphs 10/15/2014 31   Final  . Monocytes 10/15/2014 7   Final  . Eos  10/15/2014 2   Final  . Basos 10/15/2014 0   Final  . Neutrophils Absolute 10/15/2014 4.0  1.4 - 7.0 x10E3/uL Final  . Lymphocytes Absolute 10/15/2014 2.0  0.7 - 3.1 x10E3/uL Final  . Monocytes Absolute 10/15/2014 0.5  0.1 - 0.9 x10E3/uL Final  . Eosinophils Absolute 10/15/2014 0.1  0.0 - 0.4 x10E3/uL Final  . Basophils Absolute 10/15/2014 0.0  0.0 - 0.2 x10E3/uL Final  . Immature Granulocytes 10/15/2014 0   Final  . Immature Grans (Abs) 10/15/2014 0.0  0.0 - 0.1 x10E3/uL Final    No results found.   Assessment/Plan   ICD-9-CM ICD-10-CM   1. Alzheimer's disease - stable 331.0 G30.9 CMP  2. Bladder prolapse, female, acquired - stable 618.01 N81.10   3. Hyperlipidemia - stable 272.4 E78.5 CMP     Lipid Panel  4. Chronic kidney disease (CKD), stage III (moderate) - stable 585.3 N18.3 CMP  5. Osteoporosis - not taking med as rx 733.00 M81.0     --Start fosamax WEEKLY for osteoporosis. Continue calcium with D daily  --Continue other medications as ordered  --Follow up in 6 mos for CPE  [facility] S. Perlie Gold  [facility] [address] [place],  [address] [phone] Cell (Monday-Friday 8 AM - 5 PM) [phone] After 5 PM and follow prompts

## 2015-05-09 LAB — COMPREHENSIVE METABOLIC PANEL
A/G RATIO: 1.1 (ref 1.1–2.5)
ALK PHOS: 62 IU/L (ref 39–117)
ALT: 6 IU/L (ref 0–32)
AST: 18 IU/L (ref 0–40)
Albumin: 3.3 g/dL (ref 3.2–4.6)
BILIRUBIN TOTAL: 0.2 mg/dL (ref 0.0–1.2)
BUN/Creatinine Ratio: 17 (ref 11–26)
BUN: 46 mg/dL — AB (ref 10–36)
CALCIUM: 10 mg/dL (ref 8.7–10.3)
CO2: 23 mmol/L (ref 18–29)
Chloride: 98 mmol/L (ref 97–108)
Creatinine, Ser: 2.66 mg/dL — ABNORMAL HIGH (ref 0.57–1.00)
GFR, EST AFRICAN AMERICAN: 17 mL/min/{1.73_m2} — AB (ref >59)
GFR, EST NON AFRICAN AMERICAN: 15 mL/min/{1.73_m2} — AB (ref >59)
GLOBULIN, TOTAL: 3 g/dL (ref 1.5–4.5)
Glucose: 132 mg/dL — ABNORMAL HIGH (ref 65–99)
POTASSIUM: 4.9 mmol/L (ref 3.5–5.2)
SODIUM: 137 mmol/L (ref 134–144)
TOTAL PROTEIN: 6.3 g/dL (ref 6.0–8.5)

## 2015-05-09 LAB — LIPID PANEL
CHOL/HDL RATIO: 4.2 ratio (ref 0.0–4.4)
Cholesterol, Total: 195 mg/dL (ref 100–199)
HDL: 46 mg/dL (ref >39)
LDL Calculated: 123 mg/dL — ABNORMAL HIGH (ref 0–99)
Triglycerides: 129 mg/dL (ref 0–149)
VLDL Cholesterol Cal: 26 mg/dL (ref 5–40)

## 2015-05-11 ENCOUNTER — Telehealth: Payer: Self-pay

## 2015-05-11 NOTE — Telephone Encounter (Signed)
Closing encounter

## 2015-06-18 ENCOUNTER — Other Ambulatory Visit: Payer: Self-pay | Admitting: Internal Medicine

## 2015-08-04 ENCOUNTER — Encounter (HOSPITAL_COMMUNITY): Payer: Self-pay

## 2015-08-04 ENCOUNTER — Other Ambulatory Visit: Payer: Self-pay

## 2015-08-04 ENCOUNTER — Inpatient Hospital Stay (HOSPITAL_COMMUNITY)
Admission: EM | Admit: 2015-08-04 | Discharge: 2015-08-08 | DRG: 193 | Disposition: A | Discharge status: Home / Self Care | Payer: Medicare Other | Attending: Internal Medicine | Admitting: Internal Medicine

## 2015-08-04 ENCOUNTER — Ambulatory Visit (INDEPENDENT_AMBULATORY_CARE_PROVIDER_SITE_OTHER): Payer: Medicare Other | Admitting: Nurse Practitioner

## 2015-08-04 ENCOUNTER — Emergency Department (HOSPITAL_COMMUNITY): Payer: Medicare Other

## 2015-08-04 VITALS — BP 110/70 | HR 107 | Temp 97.7°F | Resp 20 | Wt 133.2 lb

## 2015-08-04 DIAGNOSIS — N184 Chronic kidney disease, stage 4 (severe): Secondary | ICD-10-CM | POA: Diagnosis present

## 2015-08-04 DIAGNOSIS — D631 Anemia in chronic kidney disease: Secondary | ICD-10-CM | POA: Diagnosis present

## 2015-08-04 DIAGNOSIS — R000 Tachycardia, unspecified: Secondary | ICD-10-CM

## 2015-08-04 DIAGNOSIS — J189 Pneumonia, unspecified organism: Secondary | ICD-10-CM | POA: Diagnosis not present

## 2015-08-04 DIAGNOSIS — Z7982 Long term (current) use of aspirin: Secondary | ICD-10-CM | POA: Diagnosis not present

## 2015-08-04 DIAGNOSIS — E871 Hypo-osmolality and hyponatremia: Secondary | ICD-10-CM | POA: Diagnosis present

## 2015-08-04 DIAGNOSIS — Z23 Encounter for immunization: Secondary | ICD-10-CM | POA: Diagnosis not present

## 2015-08-04 DIAGNOSIS — E860 Dehydration: Secondary | ICD-10-CM | POA: Diagnosis present

## 2015-08-04 DIAGNOSIS — J9601 Acute respiratory failure with hypoxia: Secondary | ICD-10-CM | POA: Diagnosis present

## 2015-08-04 DIAGNOSIS — N179 Acute kidney failure, unspecified: Secondary | ICD-10-CM | POA: Diagnosis present

## 2015-08-04 DIAGNOSIS — E785 Hyperlipidemia, unspecified: Secondary | ICD-10-CM | POA: Diagnosis present

## 2015-08-04 DIAGNOSIS — G309 Alzheimer's disease, unspecified: Secondary | ICD-10-CM

## 2015-08-04 DIAGNOSIS — Z79899 Other long term (current) drug therapy: Secondary | ICD-10-CM

## 2015-08-04 DIAGNOSIS — F0280 Dementia in other diseases classified elsewhere without behavioral disturbance: Secondary | ICD-10-CM | POA: Diagnosis present

## 2015-08-04 DIAGNOSIS — R0600 Dyspnea, unspecified: Secondary | ICD-10-CM | POA: Diagnosis not present

## 2015-08-04 DIAGNOSIS — D638 Anemia in other chronic diseases classified elsewhere: Secondary | ICD-10-CM | POA: Diagnosis present

## 2015-08-04 DIAGNOSIS — R0602 Shortness of breath: Secondary | ICD-10-CM

## 2015-08-04 HISTORY — DX: Rectal polyp: K62.1

## 2015-08-04 LAB — CBC WITH DIFFERENTIAL/PLATELET
Basophils Absolute: 0 10*3/uL (ref 0.0–0.1)
Basophils Relative: 0 % (ref 0–1)
EOS ABS: 0.1 10*3/uL (ref 0.0–0.7)
Eosinophils Relative: 2 % (ref 0–5)
HEMATOCRIT: 34.4 % — AB (ref 36.0–46.0)
HEMOGLOBIN: 10.8 g/dL — AB (ref 12.0–15.0)
LYMPHS ABS: 0.7 10*3/uL (ref 0.7–4.0)
Lymphocytes Relative: 9 % — ABNORMAL LOW (ref 12–46)
MCH: 28.7 pg (ref 26.0–34.0)
MCHC: 31.4 g/dL (ref 30.0–36.0)
MCV: 91.5 fL (ref 78.0–100.0)
MONOS PCT: 8 % (ref 3–12)
Monocytes Absolute: 0.6 10*3/uL (ref 0.1–1.0)
NEUTROS PCT: 81 % — AB (ref 43–77)
Neutro Abs: 6.3 10*3/uL (ref 1.7–7.7)
Platelets: 363 10*3/uL (ref 150–400)
RBC: 3.76 MIL/uL — ABNORMAL LOW (ref 3.87–5.11)
RDW: 14.2 % (ref 11.5–15.5)
WBC: 7.8 10*3/uL (ref 4.0–10.5)

## 2015-08-04 LAB — COMPREHENSIVE METABOLIC PANEL
ALBUMIN: 2.7 g/dL — AB (ref 3.5–5.0)
ALT: 14 U/L (ref 14–54)
AST: 24 U/L (ref 15–41)
Alkaline Phosphatase: 53 U/L (ref 38–126)
Anion gap: 7 (ref 5–15)
BUN: 60 mg/dL — ABNORMAL HIGH (ref 6–20)
CHLORIDE: 101 mmol/L (ref 101–111)
CO2: 26 mmol/L (ref 22–32)
CREATININE: 2.66 mg/dL — AB (ref 0.44–1.00)
Calcium: 10.7 mg/dL — ABNORMAL HIGH (ref 8.9–10.3)
GFR calc non Af Amer: 14 mL/min — ABNORMAL LOW (ref >60)
GFR, EST AFRICAN AMERICAN: 17 mL/min — AB (ref >60)
Glucose, Bld: 99 mg/dL (ref 65–99)
Potassium: 5.1 mmol/L (ref 3.5–5.1)
SODIUM: 134 mmol/L — AB (ref 135–145)
Total Bilirubin: 0.4 mg/dL (ref 0.3–1.2)
Total Protein: 7 g/dL (ref 6.5–8.1)

## 2015-08-04 LAB — BRAIN NATRIURETIC PEPTIDE: B Natriuretic Peptide: 101.3 pg/mL — ABNORMAL HIGH (ref 0.0–100.0)

## 2015-08-04 LAB — I-STAT TROPONIN, ED: TROPONIN I, POC: 0.08 ng/mL (ref 0.00–0.08)

## 2015-08-04 LAB — I-STAT CG4 LACTIC ACID, ED: Lactic Acid, Venous: 0.78 mmol/L (ref 0.5–2.0)

## 2015-08-04 MED ORDER — HEPARIN SODIUM (PORCINE) 5000 UNIT/ML IJ SOLN
5000.0000 [IU] | Freq: Three times a day (TID) | INTRAMUSCULAR | Status: DC
  Administered 2015-08-04 – 2015-08-08 (×11): 5000 [IU] via SUBCUTANEOUS
  Filled 2015-08-04 (×14): qty 1

## 2015-08-04 MED ORDER — ASPIRIN EC 81 MG PO TBEC
81.0000 mg | DELAYED_RELEASE_TABLET | Freq: Every day | ORAL | Status: DC
  Administered 2015-08-05 – 2015-08-08 (×4): 81 mg via ORAL
  Filled 2015-08-04 (×4): qty 1

## 2015-08-04 MED ORDER — DEXTROSE 5 % IV SOLN
500.0000 mg | INTRAVENOUS | Status: DC
  Administered 2015-08-05: 500 mg via INTRAVENOUS
  Filled 2015-08-04: qty 500

## 2015-08-04 MED ORDER — DEXTROSE 5 % IV SOLN
1.0000 g | INTRAVENOUS | Status: DC
  Administered 2015-08-05: 1 g via INTRAVENOUS
  Filled 2015-08-04 (×2): qty 10

## 2015-08-04 MED ORDER — DOCUSATE SODIUM 100 MG PO CAPS
100.0000 mg | ORAL_CAPSULE | Freq: Every day | ORAL | Status: DC
  Administered 2015-08-05 – 2015-08-08 (×4): 100 mg via ORAL
  Filled 2015-08-04 (×4): qty 1

## 2015-08-04 MED ORDER — ENOXAPARIN SODIUM 40 MG/0.4ML ~~LOC~~ SOLN
40.0000 mg | SUBCUTANEOUS | Status: DC
Start: 2015-08-04 — End: 2015-08-04
  Filled 2015-08-04: qty 0.4

## 2015-08-04 MED ORDER — FUROSEMIDE 10 MG/ML IJ SOLN: 10.0000 mg | Freq: Every day | INTRAMUSCULAR | Status: DC

## 2015-08-04 MED ORDER — DEXTROSE 5 % IV SOLN
500.0000 mg | Freq: Once | INTRAVENOUS | Status: AC
  Administered 2015-08-04: 500 mg via INTRAVENOUS
  Filled 2015-08-04: qty 500

## 2015-08-04 MED ORDER — DEXTROSE 5 % IV SOLN
1.0000 g | Freq: Once | INTRAVENOUS | Status: AC
  Administered 2015-08-04: 1 g via INTRAVENOUS
  Filled 2015-08-04: qty 10

## 2015-08-04 MED ORDER — FUROSEMIDE 10 MG/ML IJ SOLN
20.0000 mg | Freq: Every day | INTRAMUSCULAR | Status: DC
  Administered 2015-08-04: 20 mg via INTRAVENOUS
  Filled 2015-08-04: qty 4

## 2015-08-04 NOTE — ED Provider Notes (Signed)
CSN: [id number]     Arrival date & time 08/04/15  1130 History   First MD Initiated Contact with Patient 08/04/15 1220     Chief Complaint  Patient presents with  . Shortness of Breath     (Consider location/radiation/quality/duration/timing/severity/associated sxs/prior Treatment) HPI  81 year old female presents from her primary care physician's office with new hypoxia. The history is taken from the son at the bedside as the patient is confused. The patient is been having cough and congestion for about one week. Has been confused for the last few days and has been weak to the point that she is barely able to get out of bed. She has Alzheimer's but this confusion is more than typical. She lives at home and family checks on her and takes care of her every day. The patient has had mild ankle swelling but no increase in swelling. This ankle swelling has been present for quite some time. Her oxygen saturation was in the 70s at the PCPs office, came up appropriately with nasal cannula oxygen. Patient told family today that she thought she was having a heart attack and has been complaining of palpitations.  Past Medical History  Diagnosis Date  . Prolapsed bladder   . Rectal prolapse   . Hyperlipidemia   . Chronic kidney disease (CKD), stage III (moderate) 06/27/2012  . Bladder prolapse, female, acquired 06/27/2012  . Prolapsed internal hemorrhoids 06/27/2012  . Unspecified vitamin D deficiency   . Alzheimer's disease   . Anemia   . Hydronephrosis   . Personal history of fall    Past Surgical History  Procedure Laterality Date  . Appendectomy    . Joint replacement      lt hip  . Cataract extraction w/ intraocular lens  implant, bilateral    . Colonoscopy  11/22/2012    Procedure: COLONOSCOPY;  Surgeon: Shann Medal, MD;  Location: [facility];  Service: General;  Laterality: N/A;  . Polypectomy  11/22/2012    Procedure: POLYPECTOMY;  Surgeon: Shann Medal, MD;  Location: [facility];  Service:  General;;  . Bilateral wrist surgeries     Family History  Problem Relation Age of Onset  . Cancer Sister     breast   Social History  Substance Use Topics  . Smoking status: Never Smoker   . Smokeless tobacco: Never Used  . Alcohol Use: No   OB History    No data available     Review of Systems  Unable to perform ROS: Mental status change      Allergies  Review of patient's allergies indicates no known allergies.  Home Medications   Prior to Admission medications   Medication Sig Start Date End Date Taking? Authorizing Provider  alendronate (FOSAMAX) 70 MG tablet TAKE 1 TABLET BY MOUTH ONCE WEEKLY. TAKE WITH A FULL GLASS OF WATER ON AN EMPTY STOMACH. 05/08/15  Yes Gildardo Cranker, DO  aspirin EC 81 MG tablet Take 81 mg by mouth daily.   Yes Historical Provider, MD  docusate sodium (COLACE) 100 MG capsule Take 100 mg by mouth daily.   Yes Historical Provider, MD  Multiple Vitamin (MULTIVITAMIN WITH MINERALS) TABS Take 1 tablet by mouth daily.   Yes Historical Provider, MD  polyethylene glycol powder (GLYCOLAX/MIRALAX) powder TAKE 17 GRAMS DAILY. ( MIX WITH WATER, JUICE, SODA, TEA OR COFFEE. ) 06/18/15  Yes Gildardo Cranker, DO  simvastatin (ZOCOR) 5 MG tablet TAKE ONE TABLET AT BEDTIME. 01/30/15  Yes Gildardo Cranker, DO   BP  134/61 mmHg  Pulse 95  Temp(Src) 98.3 F (36.8 C) (Oral)  Resp 23  Wt 133 lb (60.328 kg)  SpO2 91% Physical Exam  Constitutional: She appears well-developed and well-nourished.  HENT:  Head: Normocephalic and atraumatic.  Right Ear: External ear normal.  Left Ear: External ear normal.  Nose: Nose normal.  Eyes: Right eye exhibits no discharge. Left eye exhibits no discharge.  Cardiovascular: Normal rate, regular rhythm and normal heart sounds.   Pulmonary/Chest: Tachypnea noted. No respiratory distress. She has rales in the right middle field, the right lower field, the left middle field and the left lower field.  Abdominal: Soft. She exhibits no  distension. There is no tenderness.  Musculoskeletal: She exhibits edema (trace pedal edema bilaterally).  Neurological: She is alert. She is disoriented.  Skin: Skin is warm and dry.  Nursing note and vitals reviewed.   ED Course  Procedures (including critical care time) Labs Review Labs Reviewed  COMPREHENSIVE METABOLIC PANEL - Abnormal; Notable for the following:    Sodium 134 (*)    BUN 60 (*)    Creatinine, Ser 2.66 (*)    Calcium 10.7 (*)    Albumin 2.7 (*)    GFR calc non Af Amer 14 (*)    GFR calc Af Amer 17 (*)    All other components within normal limits  CBC WITH DIFFERENTIAL/PLATELET - Abnormal; Notable for the following:    RBC 3.76 (*)    Hemoglobin 10.8 (*)    HCT 34.4 (*)    Neutrophils Relative % 81 (*)    Lymphocytes Relative 9 (*)    All other components within normal limits  BRAIN NATRIURETIC PEPTIDE - Abnormal; Notable for the following:    B Natriuretic Peptide 101.3 (*)    All other components within normal limits  CULTURE, BLOOD (ROUTINE X 2)  CULTURE, BLOOD (ROUTINE X 2)  I-STAT TROPOININ, ED  I-STAT CG4 LACTIC ACID, ED    Imaging Review Dg Chest 2 View  08/04/2015   CLINICAL DATA:  Decreased oxygen saturation today.  EXAM: CHEST  2 VIEW  COMPARISON:  PA and lateral chest 10/17/2012.  FINDINGS: There is cardiomegaly and extensive bilateral airspace disease with an appearance most compatible with pulmonary edema. Small bilateral pleural effusions are identified. No pneumothorax.  IMPRESSION: Cardiomegaly and bilateral airspace disease likely due to pulmonary edema rather than pneumonia.   Electronically Signed   By: Inge Rise M.D.   On: 08/04/2015 14:36   I have personally reviewed and evaluated these images and lab results as part of my medical decision-making.   EKG Interpretation   Date/Time:  Tuesday August 04 2015 11:50:22 EDT Ventricular Rate:  89 PR Interval:  273 QRS Duration: 75 QT Interval:  349 QTC Calculation: 425 R  Axis:   52 Text Interpretation:  Sinus rhythm Prolonged PR interval no significant  change since Nov 2013 Confirmed by Regenia Skeeter  MD, [place] ([id number]) on 08/04/2015  2:53:28 PM      MDM   Final diagnoses:  CAP (community acquired pneumonia)    Clinically, patient appears to have more of a pneumonia picture than CHF. She has trace pedal edema but no other signs of fluid overload. X-ray shows CHF versus pneumonia. While she is not febrile she has had a progressive URI into pneumonia picture and she has been given IV antibiotics after blood cultures. Admit to the hospitalist. Patient's airway is stable this time but of note patient is a full code at this time.  Sherwood Gambler, MD 08/04/15 [phone]

## 2015-08-04 NOTE — H&P (Addendum)
[facility] History and Physical  [name] HKV:[id number] DOB: [birth year] DOA: 08/04/2015  Referring physician: ED physician, Dr. Verta Ellen PCP: Gildardo Cranker, DO   Chief Complaint: dyspnea  HPI:  Pt is 81 yo female with known Alzheimer's dementia, who presented to [facility] ED with main concern of several days duration of progressive dyspnea that initially started with exertion and has progressed to dyspnea at rest. Please note that son was initially present in ED and able to provide history but at the time of the admission, no family present and pt is not able to provide any information. Pt says she is not sure why she is not at home. Per ED doctor, son reported dyspnea has been associated with mixed episodes of non productive and productive cough of clear sputum, subjective fevers, chills, chest discomfort with coughing spells. No abd or urinary concerns.  In ED, pt noted to be hemodynamically stable with initial oxygen saturation in 70's on RA and HR up to 107. CXR concerning for developing pulmonary vascular congestion vs PNA. Pt started on Zithromax and Rocephin and TRH asked to admit to telemetry bed for further evaluation.   Assessment and Plan: Active Problems: Acute hypoxic respiratory failure - secondary to CAP with possible acute diastolic CHF - pt does not look much volume overloaded on exam but has minimal crackles  - admit to telemetry bed - continue Rocephin and Zithromax - lasix given in ED but will hold for now as Ca level is slightly elevated and Na is mildly low  - obtain 2  D ECHO - follow daily weights, strict I/O, sputum cultures, urine legionella and strep pneumo   Acute on chronic renal failure, stage II - III - would not give lasix at this time - assess clinical status daily - pt eating well now, if Cr continues trending up, consider IVF - BMP in AM  Hyponatremia - monitor and add IVF if Na continues trending down - repeat BMP in AM  Hypercalcemia -  hold further Lasix - may need IVF if Ca continues trending up   Dementia, Alzheimer's - will need PT evaluation prior to discharge  DVT prophylaxis - Heparin SQ  Radiological Exams on Admission: Dg Chest 2 View  08/04/2015   CLINICAL DATA:  Decreased oxygen saturation today.  EXAM: CHEST  2 VIEW  COMPARISON:  PA and lateral chest 10/17/2012.  FINDINGS: There is cardiomegaly and extensive bilateral airspace disease with an appearance most compatible with pulmonary edema. Small bilateral pleural effusions are identified. No pneumothorax.  IMPRESSION: Cardiomegaly and bilateral airspace disease likely due to pulmonary edema rather than pneumonia.   Electronically Signed   By: Inge Rise M.D.   On: 08/04/2015 14:36     Code Status: Full Family Communication: Pt at bedside Disposition Plan: Admit for further evaluation    [facility] [phone]   Review of Systems:  Unable to obtain due to dementia     Past Medical History  Diagnosis Date  . Prolapsed bladder   . Rectal prolapse   . Hyperlipidemia   . Chronic kidney disease (CKD), stage III (moderate) 06/27/2012  . Bladder prolapse, female, acquired 06/27/2012  . Prolapsed internal hemorrhoids 06/27/2012  . Unspecified vitamin D deficiency   . Alzheimer's disease   . Anemia   . Hydronephrosis   . Personal history of fall     Past Surgical History  Procedure Laterality Date  . Appendectomy    . Joint replacement      lt  hip  . Cataract extraction w/ intraocular lens  implant, bilateral    . Colonoscopy  11/22/2012    Procedure: COLONOSCOPY;  Surgeon: Shann Medal, MD;  Location: [facility]S;  Service: General;  Laterality: N/A;  . Polypectomy  11/22/2012    Procedure: POLYPECTOMY;  Surgeon: Shann Medal, MD;  Location: [facility]S;  Service: General;;  . Bilateral wrist surgeries      Social History:  reports that she has never smoked. She has never used smokeless tobacco. She reports that she does not drink alcohol or  use illicit drugs.  No Known Allergies  Family History  Problem Relation Age of Onset  . Cancer Sister     breast    Prior to Admission medications   Medication Sig Start Date End Date Taking? Authorizing Provider  alendronate (FOSAMAX) 70 MG tablet TAKE 1 TABLET BY MOUTH ONCE WEEKLY. TAKE WITH A FULL GLASS OF WATER ON AN EMPTY STOMACH. 05/08/15  Yes Gildardo Cranker, DO  aspirin EC 81 MG tablet Take 81 mg by mouth daily.   Yes Historical Provider, MD  docusate sodium (COLACE) 100 MG capsule Take 100 mg by mouth daily.   Yes Historical Provider, MD  Multiple Vitamin (MULTIVITAMIN WITH MINERALS) TABS Take 1 tablet by mouth daily.   Yes Historical Provider, MD  polyethylene glycol powder (GLYCOLAX/MIRALAX) powder TAKE 17 GRAMS DAILY. ( MIX WITH WATER, JUICE, SODA, TEA OR COFFEE. ) 06/18/15  Yes Gildardo Cranker, DO  simvastatin (ZOCOR) 5 MG tablet TAKE ONE TABLET AT BEDTIME. 01/30/15  Yes Gildardo Cranker, DO    Physical Exam: Filed Vitals:   08/04/15 1143 08/04/15 1148 08/04/15 1245 08/04/15 1456  BP: 134/61 134/61  117/74  Pulse: 93 95 78 78  Temp:  98.3 F (36.8 C)    TempSrc:  Oral    Resp:  23 28 24   Weight:  60.328 kg (133 lb)    SpO2: 90% 91% 98% 98%    Physical Exam  Constitutional: Appears well-developed and well-nourished. No distress.  HENT: Normocephalic. External right and left ear normal. Oropharynx is clear and moist.  Eyes: Conjunctivae and EOM are normal. PERRLA, no scleral icterus.  Neck: Normal ROM. Neck supple. No JVD. No tracheal deviation. No thyromegaly.  CVS: RRR, no gallops, no carotid bruit.  Pulmonary: Effort and breath sounds normal, no stridor, mild crackles at bases   Abdominal: Soft. BS +,  no distension, tenderness, rebound or guarding.  Musculoskeletal: Normal range of motion.   Lymphadenopathy: No lymphadenopathy noted, cervical, inguinal. Neuro: Alert. No cranial nerve deficit. Skin: Skin is warm and dry. No rash noted. Not diaphoretic. No erythema. No  pallor.  Psychiatric: Normal mood and affect.   Labs on Admission:  Basic Metabolic Panel:  Recent Labs Lab 08/04/15 1316  NA 134*  K 5.1  CL 101  CO2 26  GLUCOSE 99  BUN 60*  CREATININE 2.66*  CALCIUM 10.7*   Liver Function Tests:  Recent Labs Lab 08/04/15 1316  AST 24  ALT 14  ALKPHOS 53  BILITOT 0.4  PROT 7.0  ALBUMIN 2.7*   No results for input(s): LIPASE, AMYLASE in the last 168 hours. No results for input(s): AMMONIA in the last 168 hours. CBC:  Recent Labs Lab 08/04/15 1316  WBC 7.8  NEUTROABS 6.3  HGB 10.8*  HCT 34.4*  MCV 91.5  PLT 363   Cardiac Enzymes: No results for input(s): CKTOTAL, CKMB, CKMBINDEX, TROPONINI in the last 168 hours. BNP: Invalid input(s): POCBNP CBG: No  results for input(s): GLUCAP in the last 168 hours.  EKG: pending    If 7PM-7AM, please contact night-coverage [email or web address] Password [facility] 08/04/2015, 3:43 PM

## 2015-08-04 NOTE — ED Notes (Signed)
Bed: WA02 Expected date:  Expected time:  Means of arrival:  Comments: Hold for triage 

## 2015-08-04 NOTE — ED Notes (Signed)
F/u call placed to laboratory regarding BNP results, technician

## 2015-08-04 NOTE — Progress Notes (Signed)
Patient ID: [name], female   DOB: 11/14/1919, 81 y.o.   MRN: [id number]    PCP: Gildardo Cranker, DO  No Known Allergies  Chief Complaint  Patient presents with  . Medical Management of Chronic Issues    unable to swallow, breathing labored,memory getting worse,     HPI: Patient is a 81 y.o. female seen in the office today due to decline in status. Pt here with son. Son reports she has gotten progressively worse over the last week. Pt lives alone. Son trys to help pt but she declines helps a lot of the time. Pt has not been eating in the last few days. Increased work of breathing. Unable to get out of bed. Not wanting the air on in the house. pts O2 was 72% when she arrived to office, O2 applied at 2 L. Pt has also become progressively more confused. Son has been looking into more assistance and moving pt but she has refused.   Advanced Directive information Does patient have an advance directive?: Yes, Type of Advance Directive: [place], Does patient want to make changes to advanced directive?: No - Patient declined Review of Systems:  Review of Systems  Unable to perform ROS: Dementia    Past Medical History  Diagnosis Date  . Prolapsed bladder   . Rectal prolapse   . Hyperlipidemia   . Chronic kidney disease (CKD), stage III (moderate) 06/27/2012  . Bladder prolapse, female, acquired 06/27/2012  . Prolapsed internal hemorrhoids 06/27/2012  . Unspecified vitamin D deficiency   . Alzheimer's disease   . Anemia   . Hydronephrosis   . Personal history of fall    Past Surgical History  Procedure Laterality Date  . Appendectomy    . Joint replacement      lt hip  . Cataract extraction w/ intraocular lens  implant, bilateral    . Colonoscopy  11/22/2012    Procedure: COLONOSCOPY;  Surgeon: Shann Medal, MD;  Location: [facility];  Service: General;  Laterality: N/A;  . Polypectomy  11/22/2012    Procedure: POLYPECTOMY;  Surgeon: Shann Medal, MD;   Location: [facility];  Service: General;;  . Bilateral wrist surgeries     Social History:   reports that she has never smoked. She has never used smokeless tobacco. She reports that she does not drink alcohol or use illicit drugs.  Family History  Problem Relation Age of Onset  . Cancer Sister     breast    Medications: Patient's Medications  New Prescriptions   No medications on file  Previous Medications   ALENDRONATE (FOSAMAX) 70 MG TABLET    TAKE 1 TABLET BY MOUTH ONCE WEEKLY. TAKE WITH A FULL GLASS OF WATER ON AN EMPTY STOMACH.   ASPIRIN EC 81 MG TABLET    Take 81 mg by mouth daily.   MULTIPLE VITAMIN (MULTIVITAMIN WITH MINERALS) TABS    Take 1 tablet by mouth daily.   POLYETHYLENE GLYCOL POWDER (GLYCOLAX/MIRALAX) POWDER    TAKE 17 GRAMS DAILY. ( MIX WITH WATER, JUICE, SODA, TEA OR COFFEE. )   SENNA-DOCUSATE (SENOKOT-S) 8.6-50 MG PER TABLET    Take 1 tablet by mouth daily.   SIMVASTATIN (ZOCOR) 5 MG TABLET    TAKE ONE TABLET AT BEDTIME.  Modified Medications   No medications on file  Discontinued Medications   No medications on file     Physical Exam:  Filed Vitals:   08/04/15 1035  BP: 110/70  Pulse: 107  Temp: 97.7 F (36.5 C)  TempSrc: Oral  Resp: 20  Weight: 133 lb 3.2 oz (60.419 kg)  SpO2: 94%    Physical Exam  Constitutional: No distress.  Frail appearing female  Neck: Normal range of motion. Neck supple.  Cardiovascular: An irregular rhythm present. Tachycardia present.   Pulmonary/Chest: Effort normal. She has decreased breath sounds. She has rhonchi in the right middle field and the right lower field.  Increased work of breathing, no respiratory distress  Abdominal: Soft. Bowel sounds are normal.  Musculoskeletal: She exhibits no edema or tenderness.  Neurological: She is alert.  Skin: Skin is warm and dry. She is not diaphoretic.    Labs reviewed: Basic Metabolic Panel:  Recent Labs  10/15/14 1213 05/08/15 1235  NA 135 137  K 5.0 4.9  CL  99 98  CO2 20 23  GLUCOSE 81 132*  BUN 66* 46*  CREATININE 2.85* 2.66*  CALCIUM 10.4* 10.0   Liver Function Tests:  Recent Labs  05/08/15 1235  AST 18  ALT 6  ALKPHOS 62  BILITOT 0.2  PROT 6.3   No results for input(s): LIPASE, AMYLASE in the last 8760 hours. No results for input(s): AMMONIA in the last 8760 hours. CBC:  Recent Labs  10/15/14 1213  WBC 6.6  NEUTROABS 4.0  HGB 9.9*  HCT 30.1*  MCV 91   Lipid Panel:  Recent Labs  05/08/15 1235  CHOL 195  HDL 46  LDLCALC 123*  TRIG 129  CHOLHDL 4.2   TSH: No results for input(s): TSH in the last 8760 hours. A1C: No results found for: HGBA1C   Assessment/Plan  1. Shortness of breath -pt with worsening shortness of breath that has progressively gotten worse of the last 5 days. Pt with O2 sats of 73% when she arrived to our office and O2 was applied at 2L, O2 increased to 94%. O2 removed for 1 min and O2 dropped to 88% -pt with abnormal lungs sounds, most likely shortness of breath and tachycardia associated with pneumonia.   2. Tachycardia - likely due to pneumonia with poor PO intake  3. Alzheimer's disease -memory loss with acute decline, pt with 13 lb weight loss since June.  son feels like she needs more assistance and can not go home safely  -due to shortness of breath with inability to maintain O2 saturation over 90% on room air, tachycardia and failure to thrive will sent to ED for further evaluation or treatment.  EMS called, pt sent out with EMS.  -discussed plan of care with son, pt will most likely need long term placement after hospitalization.   Carlos American. Harle Battiest  [facility] [phone] 8 am - 5 pm) [phone] (after hours)

## 2015-08-04 NOTE — ED Notes (Signed)
Bib ems from pmd related to decrease spo2, per ems spo2 80% on RA placed on 2l increased to 95%. Pt denies complaints at this time.

## 2015-08-05 ENCOUNTER — Inpatient Hospital Stay (HOSPITAL_COMMUNITY): Payer: Medicare Other

## 2015-08-05 ENCOUNTER — Encounter (HOSPITAL_COMMUNITY): Payer: Self-pay | Admitting: Internal Medicine

## 2015-08-05 DIAGNOSIS — G309 Alzheimer's disease, unspecified: Secondary | ICD-10-CM

## 2015-08-05 DIAGNOSIS — J189 Pneumonia, unspecified organism: Principal | ICD-10-CM

## 2015-08-05 DIAGNOSIS — D638 Anemia in other chronic diseases classified elsewhere: Secondary | ICD-10-CM

## 2015-08-05 DIAGNOSIS — E871 Hypo-osmolality and hyponatremia: Secondary | ICD-10-CM | POA: Diagnosis present

## 2015-08-05 DIAGNOSIS — N184 Chronic kidney disease, stage 4 (severe): Secondary | ICD-10-CM

## 2015-08-05 DIAGNOSIS — J9601 Acute respiratory failure with hypoxia: Secondary | ICD-10-CM

## 2015-08-05 DIAGNOSIS — R0600 Dyspnea, unspecified: Secondary | ICD-10-CM

## 2015-08-05 LAB — CBC
HEMATOCRIT: 31 % — AB (ref 36.0–46.0)
HEMOGLOBIN: 9.6 g/dL — AB (ref 12.0–15.0)
MCH: 28.4 pg (ref 26.0–34.0)
MCHC: 31 g/dL (ref 30.0–36.0)
MCV: 91.7 fL (ref 78.0–100.0)
Platelets: 335 10*3/uL (ref 150–400)
RBC: 3.38 MIL/uL — ABNORMAL LOW (ref 3.87–5.11)
RDW: 14.2 % (ref 11.5–15.5)
WBC: 8.3 10*3/uL (ref 4.0–10.5)

## 2015-08-05 LAB — BASIC METABOLIC PANEL
ANION GAP: 7 (ref 5–15)
BUN: 61 mg/dL — AB (ref 6–20)
CHLORIDE: 103 mmol/L (ref 101–111)
CO2: 26 mmol/L (ref 22–32)
Calcium: 10.2 mg/dL (ref 8.9–10.3)
Creatinine, Ser: 2.8 mg/dL — ABNORMAL HIGH (ref 0.44–1.00)
GFR calc Af Amer: 16 mL/min — ABNORMAL LOW (ref >60)
GFR, EST NON AFRICAN AMERICAN: 13 mL/min — AB (ref >60)
GLUCOSE: 97 mg/dL (ref 65–99)
POTASSIUM: 4.8 mmol/L (ref 3.5–5.1)
Sodium: 136 mmol/L (ref 135–145)

## 2015-08-05 MED ORDER — ENSURE ENLIVE PO LIQD
237.0000 mL | Freq: Two times a day (BID) | ORAL | Status: DC
  Administered 2015-08-05 – 2015-08-08 (×6): 237 mL via ORAL

## 2015-08-05 NOTE — Evaluation (Signed)
Physical Therapy Evaluation Patient Details Name: [name] MRN: [id number] DOB: Jul 19, 1919 Today's Date: 08/05/2015   History of Present Illness  Pt is 81 yo female with known Alzheimer's dementia, who presented to [facility] ED 9/6/16with main concern of several days duration of progressive dyspnea that initially started with exertion and has progressed to dyspnea at rest. Sats on RA 70's.In ED, pt noted to be hemodynamically stable with initial oxygen saturation in 70's on RA and HR up to 107. CXR concerning for developing pulmonary vascular congestion vs PNA  Clinical Impression  Patient is pleasantly confused. Patient on 1 l with sats94% at rest. After ambulation to BR on RA sats 80%. Supplemental oxygen obtained and placed on 2 l with sats back to 93%. HR 84. BP 114/53 at rest, 110/51 after walking. Patient will benefit from PT to address problems listed in note below. No family present.    Follow Up Recommendations SNF;Supervision/Assistance - 24 hour    Equipment Recommendations  None recommended by PT    Recommendations for Other Services       Precautions / Restrictions Precautions Precautions: Fall Precaution Comments: monitor sats, has prolapsed bladder      Mobility  Bed Mobility Overal bed mobility: Needs Assistance Bed Mobility: Supine to Sit     Supine to sit: Min guard        Transfers Overall transfer level: Needs assistance Equipment used: Rolling walker (2 wheeled) Transfers: Sit to/from Stand Sit to Stand: Min assist         General transfer comment: cues for safety, hand placement  Ambulation/Gait Ambulation/Gait assistance: Min assist Ambulation Distance (Feet): 25 Feet (then 20') Assistive device: Rolling walker (2 wheeled) Gait Pattern/deviations: Step-to pattern;Step-through pattern     General Gait Details: cues for safety  Stairs            Wheelchair Mobility    Modified Rankin (Stroke Patients Only)       Balance Overall  balance assessment: Needs assistance;History of Falls Sitting-balance support: Feet supported Sitting balance-Leahy Scale: Fair     Standing balance support: During functional activity;Bilateral upper extremity supported Standing balance-Leahy Scale: Fair Standing balance comment: during pericare.                             Pertinent Vitals/Pain Pain Assessment: No/denies pain    Home Living Family/patient expects to be discharged to:: Private residence   Available Help at Discharge: Family Type of Home: House       Home Layout: One level   Additional Comments: patient states that son checks on patient. patient not able to provide info.    Prior Function Level of Independence: Independent;Independent with assistive device(s);Needs assistance   Gait / Transfers Assistance Needed: independent in house  ADL's / Homemaking Assistance Needed: son gets groceries        Hand Dominance        Extremity/Trunk Assessment   Upper Extremity Assessment: Generalized weakness           Lower Extremity Assessment: Generalized weakness         Communication   Communication: No difficulties  Cognition Arousal/Alertness: Awake/alert Behavior During Therapy: WFL for tasks assessed/performed Overall Cognitive Status: Impaired/Different from baseline Area of Impairment: Orientation Orientation Level: Time;Situation                  General Comments      Exercises  Assessment/Plan    PT Assessment Patient needs continued PT services  PT Diagnosis Difficulty walking;Generalized weakness   PT Problem List Decreased strength;Decreased activity tolerance;Decreased mobility;Decreased balance;Cardiopulmonary status limiting activity;Decreased knowledge of precautions;Decreased safety awareness;Decreased knowledge of use of DME  PT Treatment Interventions DME instruction;Gait training;Functional mobility training;Therapeutic  activities;Patient/family education   PT Goals (Current goals can be found in the Care Plan section) Acute Rehab PT Goals Patient Stated Goal: i want to go home PT Goal Formulation: With patient Time For Goal Achievement: 08/19/15 Potential to Achieve Goals: Good    Frequency Min 3X/week   Barriers to discharge Decreased caregiver support      Co-evaluation               End of Session Equipment Utilized During Treatment: Gait belt Activity Tolerance: Treatment limited secondary to medical complications (Comment) (dyspnea, low sats) Patient left: in chair;with call bell/phone within reach;with chair alarm set Nurse Communication: Mobility status         Time: 1115-1150 PT Time Calculation (min) (ACUTE ONLY): 35 min   Charges:   PT Evaluation $Initial PT Evaluation Tier I: 1 Procedure PT Treatments $Gait Training: 8-22 mins   PT G Codes:        Claretha Cooper 08/05/2015, 1:03 PM Tresa Endo PT [phone]

## 2015-08-05 NOTE — Progress Notes (Signed)
Progress Note   [name] TML:[id number] DOB: 17-Mar-1919 DOA: 08/04/2015 PCP: Gildardo Cranker, DO   Brief Narrative:   [name] is an 81 y.o. female the PMH of Alzheimer's dementia was admitted on 08/04/15 with hypoxic respiratory failure secondary to pneumonia.  Assessment/Plan:   Principal problem: Acute hypoxic respiratory failure secondary to community-acquired pneumonia rule out acute diastolic CHF - Secondary to CAP with possible acute diastolic CHF. - Continue Rocephin and Zithromax. - 2-D echo ordered. Results pending. - Monitor daily weights, strict I/O. - Follow-up sputum cultures, urine legionella and strep pneumo antigens.   Active problems:  Acute on chronic renal failure, stage IV - Current creatinine 2.8. Baseline creatinine around 2.6-2.8. GFR less than 20, consistent with stage IV disease.  Hyponatremia - Mild, resolved on recheck.  Hypercalcemia - Likely secondary to volume contraction, monitor.  Dementia, Alzheimer's - PT/OT evaluations requested.  DVT prophylaxis - Subcutaneous heparin ordered.  IV Access:    Peripheral IV   Procedures and diagnostic studies:   Dg Chest 2 View  08/04/2015   CLINICAL DATA:  Decreased oxygen saturation today.  EXAM: CHEST  2 VIEW  COMPARISON:  PA and lateral chest 10/17/2012.  FINDINGS: There is cardiomegaly and extensive bilateral airspace disease with an appearance most compatible with pulmonary edema. Small bilateral pleural effusions are identified. No pneumothorax.  IMPRESSION: Cardiomegaly and bilateral airspace disease likely due to pulmonary edema rather than pneumonia.   Electronically Signed   By: Inge Rise M.D.   On: 08/04/2015 14:36     Medical Consultants:    None.  Anti-Infectives:    Rocephin/Azithromycin 08/04/15--->  Subjective:   [name] is a bit short winded with a dry cough.  No falls at home.  PT got her up to bathroom.  No fevers.    Objective:    Filed Vitals:   08/04/15 1600 08/04/15 1630 08/04/15 2222 08/05/15 0645  BP: 127/64 122/63 99/51 97/47   Pulse: 80 76 93 86  Temp:   98.6 F (37 C) 98.4 F (36.9 C)  TempSrc:   Oral Oral  Resp: 31 27 24 23   Weight:      SpO2: 97% 99% 93% 92%    Intake/Output Summary (Last 24 hours) at 08/05/15 0813 Last data filed at 08/05/15 0600  Gross per 24 hour  Intake    120 ml  Output    250 ml  Net   -130 ml   Filed Weights   08/04/15 1148  Weight: 60.328 kg (133 lb)    Exam: Gen:  NAD Cardiovascular:  RRR, No M/R/G Respiratory:  Lungs diminished with a few crackles Gastrointestinal:  Abdomen soft, NT/ND, + BS Extremities:  No C/E/C   Data Reviewed:    Labs: Basic Metabolic Panel:  Recent Labs Lab 08/04/15 1316 08/05/15 0525  NA 134* 136  K 5.1 4.8  CL 101 103  CO2 26 26  GLUCOSE 99 97  BUN 60* 61*  CREATININE 2.66* 2.80*  CALCIUM 10.7* 10.2   GFR Estimated Creatinine Clearance: 10 mL/min (by C-G formula based on Cr of 2.8). Liver Function Tests:  Recent Labs Lab 08/04/15 1316  AST 24  ALT 14  ALKPHOS 53  BILITOT 0.4  PROT 7.0  ALBUMIN 2.7*   CBC:  Recent Labs Lab 08/04/15 1316 08/05/15 0525  WBC 7.8 8.3  NEUTROABS 6.3  --   HGB 10.8* 9.6*  HCT 34.4* 31.0*  MCV 91.5 91.7  PLT 363 335  Sepsis Labs:  Recent Labs Lab 08/04/15 1308 08/04/15 1316 08/05/15 0525  WBC  --  7.8 8.3  LATICACIDVEN 0.78  --   --    Microbiology No results found for this or any previous visit (from the past 240 hour(s)).   Medications:   . aspirin EC  81 mg Oral Daily  . azithromycin  500 mg Intravenous Q24H  . cefTRIAXone (ROCEPHIN)  IV  1 g Intravenous Q24H  . docusate sodium  100 mg Oral Daily  . heparin subcutaneous  5,000 Units Subcutaneous 3 times per day   Continuous Infusions:   Time spent: 35 minutes with > 50% of time discussing current diagnostic test results, clinical impression and plan of care.   LOS: 1 day   Anyi Fels  [facility] Pager  [phone]. If unable to reach me by pager, please call my cell phone at [phone].  *Please refer to [email or web address], password [email or web address] to get updated schedule on who will round on this patient, as hospitalists switch teams weekly. If 7PM-7AM, please contact night-coverage at [email or web address], password [email or web address] for any overnight needs.  08/05/2015, 8:13 AM

## 2015-08-05 NOTE — Progress Notes (Signed)
Echocardiogram 2D Echocardiogram has been performed.  [name] 08/05/2015, 10:28 AM

## 2015-08-05 NOTE — Care Management Note (Signed)
Case Management Note  Patient Details  Name: [name] MRN: [id number] Date of Birth: 25-Sep-1919  Subjective/Objective:             dyspnea    o2 saT 70% in ED on o2 for support   Action/Plan:Date:  Sept.7, 2016 U.R. performed for needs and level of care. Will continue to follow for Case Management needs.  Velva Harman, RN, BSN, [place]   [phone]   Expected Discharge Date:   (unknown)               Expected Discharge Plan:  Home/Self Care  In-House Referral:  NA  Discharge planning Services  CM Consult  Post Acute Care Choice:  NA Choice offered to:  NA  DME Arranged:    DME Agency:     HH Arranged:    [place] Agency:     Status of Service:  Completed, signed off  Medicare Important Message Given:    Date Medicare IM Given:    Medicare IM give by:    Date Additional Medicare IM Given:    Additional Medicare Important Message give by:     If discussed at [place] of Stay Meetings, dates discussed:    Additional Comments:  Leeroy Cha, RN 08/05/2015, 11:12 AM

## 2015-08-05 NOTE — Progress Notes (Addendum)
Initial Nutrition Assessment  DOCUMENTATION CODES:   Not applicable  INTERVENTION:  - Will order Ensure Enlive BID, each supplement provides 350 kcal and 20 grams of protein - RD will continue to monitor for needs  NUTRITION DIAGNOSIS:   Predicted suboptimal nutrient intake related to lethargy/confusion as evidenced by per patient/family report.  GOAL:   Patient will meet greater than or equal to 90% of their needs  MONITOR:   PO intake, Supplement acceptance, Weight trends, Labs, I & O's  REASON FOR ASSESSMENT:   Malnutrition Screening Tool  ASSESSMENT:   81 yo female with known Alzheimer's dementia, who presented to [facility] ED with main concern of several days duration of progressive dyspnea that initially started with exertion and has progressed to dyspnea at rest. Please note that son was initially present in ED and able to provide history but at the time of the admission, no family present and pt is not able to provide any information. Pt says she is not sure why she is not at home.   Pt seen for MST. BMI indicates overweight status. Pt states that she ate vegetable soup for lunch today without issue. She denies chewing/swallowing issues and states she had a good appetite PTA. Per chart review, pt's family had mentioned in the ED that she had not been eating for a few days PTA.  She states that she lives alone and her son brings groceries for her. She is not able to provide much other information at this time.   No muscle or fat wasting noted. Per weight hx review, pt has lost 9 lbs (6% body weight) in the past 3 months which is not significant for time frame.  Unsure if pt is meeting needs at this time. Medications reviewed. Labs reviewed; BUN/creatinine elevated, GFR: 13.   Diet Order:  Diet regular Room service appropriate?: Yes; Fluid consistency:: Thin  Skin:  Reviewed, no issues  Last BM:  9/7  Height:   Ht Readings from Last 1 Encounters:  05/08/15 5' (1.524 m)     Weight:   Wt Readings from Last 1 Encounters:  08/04/15 133 lb (60.328 kg)    Ideal Body Weight:  45.45 kg  BMI:  Body mass index is 25.97 kg/(m^2).  Estimated Nutritional Needs:   Kcal:  1200-1400  Protein:  60-70 grams  Fluid:  2 L/day  EDUCATION NEEDS:   No education needs identified at this time     Jarome Matin, RD, LDN Inpatient Clinical Dietitian Pager # [phone] After hours/weekend pager # [phone]

## 2015-08-06 MED ORDER — AZITHROMYCIN 500 MG PO TABS
500.0000 mg | ORAL_TABLET | Freq: Every day | ORAL | Status: DC
  Administered 2015-08-06 – 2015-08-08 (×3): 500 mg via ORAL
  Filled 2015-08-06 (×4): qty 1

## 2015-08-06 MED ORDER — ACETAMINOPHEN 325 MG PO TABS
650.0000 mg | ORAL_TABLET | ORAL | Status: DC | PRN
  Administered 2015-08-06 – 2015-08-08 (×3): 650 mg via ORAL
  Filled 2015-08-06 (×3): qty 2

## 2015-08-06 MED ORDER — TUBERCULIN PPD 5 UNIT/0.1ML ID SOLN
5.0000 [IU] | Freq: Once | INTRADERMAL | Status: DC
  Administered 2015-08-06: 5 [IU] via INTRADERMAL
  Filled 2015-08-06: qty 0.1

## 2015-08-06 MED ORDER — CEFUROXIME AXETIL 500 MG PO TABS
500.0000 mg | ORAL_TABLET | Freq: Two times a day (BID) | ORAL | Status: DC
  Administered 2015-08-06 – 2015-08-08 (×4): 500 mg via ORAL
  Filled 2015-08-06 (×7): qty 1

## 2015-08-06 NOTE — Clinical Social Work Placement (Signed)
   CLINICAL SOCIAL WORK PLACEMENT  NOTE  Date:  08/06/2015  Patient Details  Name: [name] MRN: [id number] Date of Birth: [birth year]  Clinical Social Work is seeking post-discharge placement for this patient at the [place] level of care (*CSW will initial, date and re-position this form in  chart as items are completed):  Yes   Patient/family provided with [place] Work Department's list of facilities offering this level of care within the geographic area requested by the patient (or if unable, by the patient's family).  Yes   Patient/family informed of their freedom to choose among providers that offer the needed level of care, that participate in Medicare, Medicaid or managed care program needed by the patient, have an available bed and are willing to accept the patient.  Yes   Patient/family informed of [facility]'s ownership interest in [facility] and [facility], as well as of the fact that they are under no obligation to receive care at these facilities.  PASRR submitted to EDS on       PASRR number received on       Existing PASRR number confirmed on       FL2 transmitted to all facilities in geographic area requested by pt/family on 08/06/15     FL2 transmitted to all facilities within larger geographic area on       Patient informed that his/her managed care company has contracts with or will negotiate with certain facilities, including the following:            Patient/family informed of bed offers received.  Patient chooses bed at       Physician recommends and patient chooses bed at      Patient to be transferred to   on  .  Patient to be transferred to facility by       Patient family notified on   of transfer.  Name of family member notified:        PHYSICIAN       Additional Comment:    _______________________________________________ Boone Master, [place] 08/06/2015, 5:08 PM

## 2015-08-06 NOTE — Progress Notes (Signed)
Iv occluded, subsequently removed.  Refused new iv. Patient due for a/b at 1600

## 2015-08-06 NOTE — Evaluation (Signed)
Occupational Therapy Evaluation Patient Details Name: [name] MRN: [id number] DOB: 1919-09-26 Today's Date: 08/06/2015    History of Present Illness Pt is 81 yo female with known Alzheimer's dementia, who presented to [facility] ED 9/6/16with main concern of several days duration of progressive dyspnea that initially started with exertion and progressed to dyspnea at rest.  Dx with acute respiratory failure with hypoxia    Clinical Impression   Pt was admitted for the above. She lives alone and completes basic adls with setup assistance.  She currently needs min A for transfers and up to mod A for LB dressing.  Goals in acute are for supervision to min A.  She will benefit from skilled OT in acute setting and also continued follow up OT after this admission    Follow Up Recommendations  SNF    Equipment Recommendations  None recommended by OT    Recommendations for Other Services       Precautions / Restrictions Precautions Precautions: Fall Restrictions Weight Bearing Restrictions: No      Mobility Bed Mobility   Bed Mobility: Supine to Sit     Supine to sit: Supervision     General bed mobility comments: for safety  Transfers Overall transfer level: Needs assistance Equipment used: Rolling walker (2 wheeled);None Transfers: Sit to/from Stand Sit to Stand: Min assist         General transfer comment: cues for safety, hand placement, stabilized walker when used; 2 tries to stand; steadying    Balance                                            ADL Overall ADL's : Needs assistance/impaired     Grooming: Set up;Sitting;Wash/dry hands;Wash/dry face   Upper Body Bathing: Set up;Sitting   Lower Body Bathing: Minimal assistance;Sit to/from stand   Upper Body Dressing : Set up;Sitting   Lower Body Dressing: Moderate assistance;Sit to/from stand   Toilet Transfer: Minimal assistance;Stand-pivot   Toileting- Water quality scientist and Hygiene:  Min guard;Sit to/from stand         General ADL Comments: completed adl from EOB. When she stood for peri care, began to urinate.  Transferred to 3:1--unsafe with RW so did not use AD on way back to bed.  Pt needed min cues for sequencing during ADL     Vision     Perception     Praxis      Pertinent Vitals/Pain Pain Assessment: No/denies pain     Hand Dominance     Extremity/Trunk Assessment Upper Extremity Assessment Upper Extremity Assessment: Overall WFL for tasks assessed           Communication Communication Communication: No difficulties   Cognition Arousal/Alertness: Awake/alert Behavior During Therapy: WFL for tasks assessed/performed Overall Cognitive Status: History of cognitive impairments - at baseline                     General Comments       Exercises       Shoulder Instructions      Home Living                                   Additional Comments: son present.  She lives alone but son checks daily.  Was unable to get OOB  couple days prior to admission. She wants to continue living on her own; son has offered her to live with him.  Will likely need SNF prior to home      Prior Functioning/Environment Level of Independence: Independent with assistive device(s)        Comments: does own bathing/dressing with things set out.  Does some easy meals    OT Diagnosis: Generalized weakness   OT Problem List: Decreased strength;Decreased activity tolerance;Impaired balance (sitting and/or standing);Decreased cognition;Decreased safety awareness   OT Treatment/Interventions: Self-care/ADL training;DME and/or AE instruction;Patient/family education;Balance training;Therapeutic activities;Cognitive remediation/compensation    OT Goals(Current goals can be found in the care plan section) Acute Rehab OT Goals Patient Stated Goal: pt's: home OT Goal Formulation: With patient/family Time For Goal Achievement:  08/20/15 Potential to Achieve Goals: Good ADL Goals Pt Will Perform Lower Body Bathing: with supervision;sit to/from stand Pt Will Perform Lower Body Dressing: with min assist;sit to/from stand Pt Will Transfer to Toilet: with min guard assist;ambulating;bedside commode;regular height toilet Pt Will Perform Toileting - Clothing Manipulation and hygiene: with supervision;sit to/from stand  OT Frequency: Min 2X/week   Barriers to D/C:            Co-evaluation              End of Session    Activity Tolerance: Patient limited by fatigue Patient left: in bed;with call bell/phone within reach;with bed alarm set;with family/visitor present   Time: 1962-2297 OT Time Calculation (min): 33 min Charges:  OT General Charges $OT Visit: 1 Procedure OT Evaluation $Initial OT Evaluation Tier I: 1 Procedure OT Treatments $Self Care/Home Management : 8-22 mins G-Codes:    Travonne Schowalter 08-13-15, 10:36 AM  Lesle Chris, OTR/L [phone] 08-13-2015

## 2015-08-06 NOTE — Clinical Social Work Note (Signed)
Clinical Social Work Assessment  Patient Details  Name: [name] MRN: [id number] Date of Birth: 28-Mar-1919  Date of referral:  08/06/15               Reason for consult:  Discharge Planning                Permission sought to share information with:  Other Permission granted to share information::  Yes, Verbal Permission Granted  Name::     Veterinary surgeon::     Relationship::  son  Contact Information:     Housing/Transportation Living arrangements for the past 2 months:  [place] of Information:  Patient, Adult Children Patient Interpreter Needed:  None Criminal Activity/Legal Involvement Pertinent to Current Situation/Hospitalization:  No - Comment as needed Significant Relationships:  Adult Children Lives with:  Self Do you feel safe going back to the place where you live?  No Need for family participation in patient care:  Yes (Comment)  Care giving concerns:  Patient is from home alone and family looking for placement.   Social Worker assessment / plan:  CSW met with patient and son at bedside. Patient working with RN staff and confused so assessment was completed with son. Patient lives at home alone but son has been having to provide more and more assistance. Son does not feel patient can continue to live alone and has been looking at ALF. Son toured [facility] and is hopeful for placement there. CSW spoke with Gae Bon at [facility] and they will come to evaluate patient on 9/9. CSW left SNF list in room and encouraged son to review list in case ALF feels patient needs higher level of care.  CSW faxed information to ALF. MD agreeable to order TB test for ALF placement. CSW will continue to follow.  Employment status:  Retired Forensic scientist:  [employer] PT Recommendations:  Research officer, trade union, [place], Home with [place] / Referral to community resources:  [place]  Patient/Family's Response to care:   Patient unable to participate. Son engaged and thanked CSW for information.  Patient/Family's Understanding of and Emotional Response to Diagnosis, Current Treatment, and Prognosis: Son reports he knows patient has been declining and is sad that he will have to place patient outside of the home knowing this is not her wish. Son is hopeful for ALF placement directly so that patient will not have to move multiple times.  Emotional Assessment Appearance:  Appears stated age Attitude/Demeanor/Rapport:  Lethargic Affect (typically observed):  Flat Orientation:  Oriented to Self, Oriented to Place Alcohol / Substance use:  Not Applicable Psych involvement (Current and /or in the community):  No (Comment)  Discharge Needs  Concerns to be addressed:  No discharge needs identified Readmission within the last 30 days:  No Current discharge risk:  None Barriers to Discharge:  No Barriers Identified   [name], [place] 08/06/2015, 4:43 PM [phone]

## 2015-08-06 NOTE — Evaluation (Signed)
Clinical/Bedside Swallow Evaluation Patient Details  Name: [name] MRN: [id number] Date of Birth: 1919/04/06  Today's Date: 08/06/2015 Time: SLP Start Time (ACUTE ONLY): 1159 SLP Stop Time (ACUTE ONLY): 1213 SLP Time Calculation (min) (ACUTE ONLY): 14 min  Past Medical History:  Past Medical History  Diagnosis Date  . Prolapsed bladder   . Rectal prolapse   . Hyperlipidemia   . Chronic kidney disease (CKD), stage III (moderate) 06/27/2012  . Bladder prolapse, female, acquired 06/27/2012  . Prolapsed internal hemorrhoids 06/27/2012  . Unspecified vitamin D deficiency   . Alzheimer's disease   . Anemia   . Hydronephrosis   . Personal history of fall   . Rectal polyp, excised 11/22/2012 01/01/2013   Past Surgical History:  Past Surgical History  Procedure Laterality Date  . Appendectomy    . Joint replacement      lt hip  . Cataract extraction w/ intraocular lens  implant, bilateral    . Colonoscopy  11/22/2012    Procedure: COLONOSCOPY;  Surgeon: Shann Medal, MD;  Location: [facility] ORS;  Service: General;  Laterality: N/A;  . Polypectomy  11/22/2012    Procedure: POLYPECTOMY;  Surgeon: Shann Medal, MD;  Location: [facility] ORS;  Service: General;;  . Bilateral wrist surgeries     HPI:  81 yo female with known Alzheimer's dementia, who presented to [facility] 9/6/16with main concern of several days duration of progressive dyspnea that initially started with exertion and progressed to dyspnea at rest. Dx with acute respiratory failure with hypoxia  Swallow evaluation ordered to rule out aspiration.  Pt also has h/o anemia, bladder collapse.  CXR showed Cardiomegaly and bilateral airspace disease likely due to pulmonary edema rather than pneumonia.  Pt denies dysphagia.   RN reports no overt difficulties observed with pt swallowing.   Assessment / Plan / Recommendation Clinical Impression  Pt presents with negative CN exam and functional oropharyngeal swallow ability assessed via bedside.  Pt  observed with minimal intake as she was just finishing lunch and did not desire to consume more than a few bites/sips.  Oral swallow revealed minimal oral "mastication" delay due to pt's dentition with trace oral residuals - which pt was aware and cleared independently.  No s/s of aspiration noted with water via cup, milk via straw or  mac and cheese.  Pt denies any symptoms of dysphagia, reflux.  She was eating with HOB lower than 45* and SLP advised her to sit upright for all intake.  Recommend continue diet as tolerated.  SLP to sign off. Thanks for this order.     Aspiration Risk  Mild    Diet Recommendation Age appropriate regular solids;Thin   Medication Administration: Whole meds with liquid Compensations: Other (Comment) (consume liquids throughout meal)    Other  Recommendations   n/a  Follow Up Recommendations    n/a   Frequency and Duration   n/a     Pertinent Vitals/Pain Afebrile, decreased     Swallow Study Prior Functional Status   see hhx    General Date of Onset: 08/06/15 Other Pertinent Information: 81 yo female with known Alzheimer's dementia, who presented to [facility] ED 9/6/16with main concern of several days duration of progressive dyspnea that initially started with exertion and progressed to dyspnea at rest. Dx with acute respiratory failure with hypoxia  Swallow evaluation ordered to rule out aspiration.  Pt also has h/o anemia, bladder collapse.  CXR showed Cardiomegaly and bilateral airspace disease likely due to pulmonary  edema rather than pneumonia.  Pt denies dysphagia.   Type of Study: Bedside swallow evaluation Diet Prior to this Study: Regular;Thin liquids Temperature Spikes Noted: No Respiratory Status: Room air History of Recent Intubation: No Behavior/Cognition: Alert;Cooperative;Pleasant mood Oral Cavity - Dentition: Other (Comment) (has no upper dentition -does not have dentures, loer dentition present xfew teeth) Self-Feeding Abilities: Able to feed  self (right handed) Patient Positioning: Upright in bed Baseline Vocal Quality: Normal Volitional Cough: Strong Volitional Swallow: Able to elicit    Oral/Motor/Sensory Function Overall Oral Motor/Sensory Function: Appears within functional limits for tasks assessed   Ice Chips Ice chips: Not tested   Thin Liquid Thin Liquid: Within functional limits Presentation: Cup;Straw    Nectar Thick Nectar Thick Liquid: Not tested   Honey Thick Honey Thick Liquid: Not tested   Puree Puree: Not tested Other Comments: pt declined to consume more intake   Solid   GO    Solid: Impaired Oral Phase Impairments: Impaired anterior to posterior transit;Impaired mastication (due to dentition issues) Other Comments: slow but effective mastication, minimal oral residuals with pt awareness that she cleared by expectorating small particle independently       Luanna Salk, [facility] [phone]

## 2015-08-06 NOTE — Progress Notes (Signed)
Progress Note   [name] YYT:[id number] DOB: 12-Nov-1919 DOA: 08/04/2015 PCP: Gildardo Cranker, DO   Brief Narrative:   [name] is an 81 y.o. female the PMH of Alzheimer's dementia was admitted on 08/04/15 with hypoxic respiratory failure secondary to pneumonia.  Assessment/Plan:   Principal problem: Acute hypoxic respiratory failure secondary to community-acquired pneumonia rule out acute diastolic CHF - Secondary to CAP with possible acute diastolic CHF. - Continue Rocephin and Zithromax. ? Aspiration, will get ST evaluation given dementia and concerns for dysphagia. - 2-D echo showed EF 65-70 percent with grade 1 diastolic dysfunction. - Monitor daily weights, strict I/O. - Follow-up sputum cultures, urine legionella and strep pneumo antigens.   Active problems:  Acute on chronic renal failure, stage IV - Current creatinine 2.8. Baseline creatinine around 2.6-2.8. GFR less than 20, consistent with stage IV disease.  Hyponatremia - Mild, resolved on recheck.  Hypercalcemia - Likely secondary to volume contraction, monitor.  Dementia, Alzheimer's - PT/OT evaluations performed: SNF recommended.  DVT prophylaxis - Subcutaneous heparin ordered.  IV Access:    Peripheral IV   Procedures and diagnostic studies:   Dg Chest 2 View  08/04/2015   CLINICAL DATA:  Decreased oxygen saturation today.  EXAM: CHEST  2 VIEW  COMPARISON:  PA and lateral chest 10/17/2012.  FINDINGS: There is cardiomegaly and extensive bilateral airspace disease with an appearance most compatible with pulmonary edema. Small bilateral pleural effusions are identified. No pneumothorax.  IMPRESSION: Cardiomegaly and bilateral airspace disease likely due to pulmonary edema rather than pneumonia.   Electronically Signed   By: Inge Rise M.D.   On: 08/04/2015 14:36     Medical Consultants:    None.  Anti-Infectives:    Rocephin/Azithromycin 08/04/15--->  Subjective:   [name] denies  SOB and cough today, insisting she wants to go home.  Son reports that she has been coughing, and that she sometimes coughs when eating.  Objective:    Filed Vitals:   08/05/15 0645 08/05/15 1715 08/05/15 2141 08/06/15 0655  BP: 97/47 108/47 119/53 125/63  Pulse: 86 80 86 80  Temp: 98.4 F (36.9 C) 98 F (36.7 C) 98.2 F (36.8 C) 98.5 F (36.9 C)  TempSrc: Oral Oral Oral Oral  Resp: 23 20 20 18   Weight:    60 kg (132 lb 4.4 oz)  SpO2: 92% 94% 93% 95%    Intake/Output Summary (Last 24 hours) at 08/06/15 0833 Last data filed at 08/05/15 1840  Gross per 24 hour  Intake    120 ml  Output      0 ml  Net    120 ml   Filed Weights   08/04/15 1148 08/06/15 0655  Weight: 60.328 kg (133 lb) 60 kg (132 lb 4.4 oz)    Exam: Gen:  NAD Cardiovascular:  RRR, No M/R/G Respiratory:  Lungs diminished  Gastrointestinal:  Abdomen soft, NT/ND, + BS Extremities:  No C/E/C   Data Reviewed:    Labs: Basic Metabolic Panel:  Recent Labs Lab 08/04/15 1316 08/05/15 0525  NA 134* 136  K 5.1 4.8  CL 101 103  CO2 26 26  GLUCOSE 99 97  BUN 60* 61*  CREATININE 2.66* 2.80*  CALCIUM 10.7* 10.2   GFR Estimated Creatinine Clearance: 9.9 mL/min (by C-G formula based on Cr of 2.8). Liver Function Tests:  Recent Labs Lab 08/04/15 1316  AST 24  ALT 14  ALKPHOS 53  BILITOT 0.4  PROT 7.0  ALBUMIN  2.7*   CBC:  Recent Labs Lab 08/04/15 1316 08/05/15 0525  WBC 7.8 8.3  NEUTROABS 6.3  --   HGB 10.8* 9.6*  HCT 34.4* 31.0*  MCV 91.5 91.7  PLT 363 335   Sepsis Labs:  Recent Labs Lab 08/04/15 1308 08/04/15 1316 08/05/15 0525  WBC  --  7.8 8.3  LATICACIDVEN 0.78  --   --    Microbiology Recent Results (from the past 240 hour(s))  Culture, blood (routine x 2)     Status: None (Preliminary result)   Collection Time: 08/04/15 12:55 PM  Result Value Ref Range Status   Specimen Description BLOOD RIGHT ANTECUBITAL  Final   Special Requests BOTTLES DRAWN AEROBIC AND  ANAEROBIC 10ML  Final   Culture   Final    NO GROWTH < 24 HOURS Performed at [facility]    Report Status PENDING  Incomplete  Culture, blood (routine x 2)     Status: None (Preliminary result)   Collection Time: 08/04/15  1:26 PM  Result Value Ref Range Status   Specimen Description BLOOD LEFT ANTECUBITAL  Final   Special Requests BOTTLES DRAWN AEROBIC AND ANAEROBIC 5ML  Final   Culture   Final    NO GROWTH < 24 HOURS Performed at [facility]    Report Status PENDING  Incomplete     Medications:   . aspirin EC  81 mg Oral Daily  . azithromycin  500 mg Intravenous Q24H  . cefTRIAXone (ROCEPHIN)  IV  1 g Intravenous Q24H  . docusate sodium  100 mg Oral Daily  . feeding supplement (ENSURE ENLIVE)  237 mL Oral BID BM  . heparin subcutaneous  5,000 Units Subcutaneous 3 times per day   Continuous Infusions:   Time spent: 35 minutes with > 50% of time discussing current diagnostic test results, clinical impression and plan of care.   LOS: 2 days   Quantarius Genrich  [facility] Pager [phone]. If unable to reach me by pager, please call my cell phone at [phone].  *Please refer to [email or web address], password [email or web address] to get updated schedule on who will round on this patient, as hospitalists switch teams weekly. If 7PM-7AM, please contact night-coverage at [email or web address], password [email or web address] for any overnight needs.  08/06/2015, 8:33 AM

## 2015-08-06 NOTE — Progress Notes (Signed)
PHARMACIST - PHYSICIAN COMMUNICATION DR:   Rama CONCERNING: Antibiotic IV to Oral Route Change Policy  RECOMMENDATION: This patient is receiving Zithromax by the intravenous route.  Based on criteria approved by the Pharmacy and Therapeutics Committee, the antibiotic(s) is/are being converted to the equivalent oral dose form(s).   DESCRIPTION: These criteria include:  Patient being treated for a respiratory tract infection, urinary tract infection, cellulitis or clostridium difficile associated diarrhea if on metronidazole  The patient is not neutropenic and does not exhibit a GI malabsorption state  The patient is eating (either orally or via tube) and/or has been taking other orally administered medications for a least 24 hours The patient is improving clinically and has a Tmax < 100.5  **Patient lost IV access and refusing new IV site.  If you have questions about this conversion, please contact the Pharmacy Department  []   [phone] )  [facility] []   [phone] )  [facility] []   [phone] )  [facility] []   [phone] )  [facility] [x]   [phone] )  [place], PharmD, BCPS Pager: [phone] 08/06/2015@9 :10 AM

## 2015-08-06 NOTE — Care Management Important Message (Signed)
Important Message  Patient Details  Name: [name] MRN: [id number] Date of Birth: 06-Jan-1919   Medicare Important Message Given:  Yes-second notification given    Camillo Flaming 08/06/2015, 11:15 AMImportant Message  Patient Details  Name: [name] MRN: [id number] Date of Birth: 1919/01/05   Medicare Important Message Given:  Yes-second notification given    Camillo Flaming 08/06/2015, 11:15 AM

## 2015-08-06 NOTE — Progress Notes (Signed)
PPD skin test injected into pts left forearm and circled w/ permanent marker. Results to be read in 48-72h.

## 2015-08-06 NOTE — Progress Notes (Signed)
PT Cancellation Note  Patient Details Name: [name] MRN: [id number] DOB: Aug 04, 1919   Cancelled Treatment:    Reason Eval/Treat Not Completed: Fatigue/lethargy limiting ability to participate Pt declined therapy due to fatigue "about to take a nap" and also reports "heart racing" HR 86 bpm and SpO2 90% room air.  RN unavailable however NT notified and to check on pt.   Jamee Keach,KATHrine E 08/06/2015, 3:38 PM Carmelia Bake, PT, DPT 08/06/2015 Pager: [phone]

## 2015-08-07 NOTE — Progress Notes (Signed)
Progress Note   [name] XLK:[id number] DOB: 1919/07/12 DOA: 08/04/2015 PCP: Gildardo Cranker, DO   Brief Narrative:   [name] is an 81 y.o. female the PMH of Alzheimer's dementia was admitted on 08/04/15 with hypoxic respiratory failure secondary to pneumonia.  Assessment/Plan:   Principal problem: Acute hypoxic respiratory failure secondary to community-acquired pneumonia rule out acute diastolic CHF - Secondary to CAP with possible acute diastolic CHF. - Continue Rocephin and Zithromax. ? Aspiration, will get ST evaluation given dementia and concerns for dysphagia. - ST evaluation done 08/06/15: No signs/symptoms of aspiration. - 2-D echo showed EF 65-70 percent with grade 1 diastolic dysfunction. - Monitor daily weights, strict I/O. - Follow-up sputum cultures/blood cultures, urine legionella and strep pneumo antigens were never collected/sent.   Active problems:  Acute on chronic renal failure, stage IV - Current creatinine 2.8. Baseline creatinine around 2.6-2.8. GFR less than 20, consistent with stage IV disease.  Hyponatremia - Mild, resolved on recheck.  Hypercalcemia - Likely secondary to volume contraction, monitor.  Dementia, Alzheimer's - PT/OT evaluations performed: SNF recommended.  DVT prophylaxis - Subcutaneous heparin ordered.  IV Access:    Peripheral IV   Procedures and diagnostic studies:   No results found.   Medical Consultants:    None.  Anti-Infectives:    Rocephin/Azithromycin 08/04/15--->  Subjective:   [name] denies SOB, but has a dry cough.  No complaints of pain.  More energy.  Objective:    Filed Vitals:   08/06/15 2106 08/06/15 2213 08/07/15 0152 08/07/15 0438  BP: 110/46 121/53 107/48 101/45  Pulse: 94 97 88 85  Temp: 99.3 F (37.4 C) 98.6 F (37 C) 97.9 F (36.6 C) 98.5 F (36.9 C)  TempSrc: Oral Oral Oral Oral  Resp: 18  18 18   Weight:      SpO2: 96% 92% 92% 92%    Intake/Output Summary (Last 24  hours) at 08/07/15 1011 Last data filed at 08/06/15 1257  Gross per 24 hour  Intake    120 ml  Output      0 ml  Net    120 ml   Filed Weights   08/04/15 1148 08/06/15 0655  Weight: 60.328 kg (133 lb) 60 kg (132 lb 4.4 oz)    Exam: Gen:  NAD Cardiovascular:  RRR, No M/R/G Respiratory:  Lungs diminished  Gastrointestinal:  Abdomen soft, NT/ND, + BS Extremities:  No C/E/C   Data Reviewed:    Labs: Basic Metabolic Panel:  Recent Labs Lab 08/04/15 1316 08/05/15 0525  NA 134* 136  K 5.1 4.8  CL 101 103  CO2 26 26  GLUCOSE 99 97  BUN 60* 61*  CREATININE 2.66* 2.80*  CALCIUM 10.7* 10.2   GFR Estimated Creatinine Clearance: 9.9 mL/min (by C-G formula based on Cr of 2.8). Liver Function Tests:  Recent Labs Lab 08/04/15 1316  AST 24  ALT 14  ALKPHOS 53  BILITOT 0.4  PROT 7.0  ALBUMIN 2.7*   CBC:  Recent Labs Lab 08/04/15 1316 08/05/15 0525  WBC 7.8 8.3  NEUTROABS 6.3  --   HGB 10.8* 9.6*  HCT 34.4* 31.0*  MCV 91.5 91.7  PLT 363 335   Sepsis Labs:  Recent Labs Lab 08/04/15 1308 08/04/15 1316 08/05/15 0525  WBC  --  7.8 8.3  LATICACIDVEN 0.78  --   --    Microbiology Recent Results (from the past 240 hour(s))  Culture, blood (routine x 2)  Status: None (Preliminary result)   Collection Time: 08/04/15 12:55 PM  Result Value Ref Range Status   Specimen Description BLOOD RIGHT ANTECUBITAL  Final   Special Requests BOTTLES DRAWN AEROBIC AND ANAEROBIC 10ML  Final   Culture   Final    NO GROWTH 2 DAYS Performed at [facility]    Report Status PENDING  Incomplete  Culture, blood (routine x 2)     Status: None (Preliminary result)   Collection Time: 08/04/15  1:26 PM  Result Value Ref Range Status   Specimen Description BLOOD LEFT ANTECUBITAL  Final   Special Requests BOTTLES DRAWN AEROBIC AND ANAEROBIC 5ML  Final   Culture   Final    NO GROWTH 2 DAYS Performed at [facility]    Report Status PENDING  Incomplete      Medications:   . aspirin EC  81 mg Oral Daily  . azithromycin  500 mg Oral Daily  . cefUROXime  500 mg Oral BID WC  . docusate sodium  100 mg Oral Daily  . feeding supplement (ENSURE ENLIVE)  237 mL Oral BID BM  . heparin subcutaneous  5,000 Units Subcutaneous 3 times per day  . tuberculin  5 Units Intradermal Once   Continuous Infusions:   Time spent: 25 minutes.   LOS: 3 days   [place] [facility] Pager [phone]. If unable to reach me by pager, please call my cell phone at [phone].  *Please refer to [email or web address], password [email or web address] to get updated schedule on who will round on this patient, as hospitalists switch teams weekly. If 7PM-7AM, please contact night-coverage at [email or web address], password [email or web address] for any overnight needs.  08/07/2015, 10:11 AM

## 2015-08-07 NOTE — Progress Notes (Signed)
Clinical Social Work  CSW met with patient and son at bedside. Patient to DC to [facility] when medically stable. MD anticipates DC on 08/08/15 and ALF agreeable to accept. CSW spoke with Levada Dy who reports FL2 and DC summary can be faxed to [phone] when patient is stable to DC. Signed FL2 on chart and handoff will be left for weekend CSW.  [place], [place] [phone]

## 2015-08-08 DIAGNOSIS — E860 Dehydration: Secondary | ICD-10-CM | POA: Diagnosis present

## 2015-08-08 MED ORDER — CEFUROXIME AXETIL 500 MG PO TABS: 500.0000 mg | ORAL_TABLET | Freq: Two times a day (BID) | ORAL | Status: AC

## 2015-08-08 MED ORDER — ENSURE ENLIVE PO LIQD: 237.0000 mL | Freq: Two times a day (BID) | ORAL | Status: AC

## 2015-08-08 MED ORDER — AZITHROMYCIN 250 MG PO TABS: 250.0000 mg | ORAL_TABLET | Freq: Every day | ORAL | Status: AC

## 2015-08-08 NOTE — Discharge Instructions (Signed)
Pneumonia, Adult  Pneumonia is an infection of the lungs. It may be caused by a germ (virus or bacteria). Some types of pneumonia can spread easily from person to person. This can happen when you cough or sneeze.  HOME CARE   Only take medicine as told by your doctor.   Take your medicine (antibiotics) as told. Finish it even if you start to feel better.   Do not smoke.   You may use a vaporizer or humidifier in your room. This can help loosen thick spit (mucus).   Sleep so you are almost sitting up (semi-upright). This helps reduce coughing.   Rest.  A shot (vaccine) can help prevent pneumonia. Shots are often advised for:   People over 65 years old.   Patients on chemotherapy.   People with long-term (chronic) lung problems.   People with immune system problems.  GET HELP RIGHT AWAY IF:    You are getting worse.   You cannot control your cough, and you are losing sleep.   You cough up blood.   Your pain gets worse, even with medicine.   You have a fever.   Any of your problems are getting worse, not better.   You have shortness of breath or chest pain.  MAKE SURE YOU:    Understand these instructions.   Will watch your condition.   Will get help right away if you are not doing well or get worse.  Document Released: 05/02/2008 Document Revised: 02/06/2012 Document Reviewed: 02/04/2011  ExitCare Patient Information 2015 [employer]. This information is not intended to replace advice given to you by your health care provider. Make sure you discuss any questions you have with your health care provider.

## 2015-08-08 NOTE — Discharge Summary (Signed)
Physician Discharge Summary  [name] QIO:[id number] DOB: 1919/01/08 DOA: 08/04/2015  PCP: Gildardo Cranker, DO  Admit date: 08/04/2015 Discharge date: 08/08/2015   Recommendations for Outpatient Follow-Up:   1. Recommend outpatient follow-up with PCP in 2 weeks to ensure complete resolution of pneumonia. 2. PCP please follow-up on final blood culture results.   Discharge Diagnosis:   Principal Problem:    Acute respiratory failure with hypoxia secondary to community-acquired pneumonia Active Problems:    Alzheimer's disease    Anemia of chronic disease    CAP (community acquired pneumonia)    Chronic kidney disease (CKD), stage IV (severe)    Hyponatremia    Hypercalcemia    Dehydration   Discharge disposition: [facility].  Discharge Condition: Improved.  Diet recommendation: Low sodium, heart healthy.    History of Present Illness:   [name] is an 81 y.o. female the PMH of Alzheimer's dementia was admitted on 08/04/15 with hypoxic respiratory failure secondary to pneumonia.   Hospital Course by Problem:   Principal problem: Acute hypoxic respiratory failure secondary to community-acquired pneumonia rule out acute diastolic CHF - Secondary to CAP with possible acute diastolic CHF. - Treated with Rocephin and Zithromax. Continue azithromycin for 2 more days and Ceftin for 4 more days. - ST evaluation done 08/06/15: No signs/symptoms of aspiration. - 2-D echo showed EF 65-70 percent with grade 1 diastolic dysfunction. - Blood cultures negative to date.  Active problems:  Anemia of chronic kidney disease - Hemoglobin stable with no indication for transfusion.  Acute on chronic renal failure, stage IV - Current creatinine 2.8. Baseline creatinine around 2.6-2.8. GFR less than 20, consistent with stage IV disease.  Hyponatremia - Mild, resolved on recheck.  Hypercalcemia/dehydration - Likely secondary to volume contraction/dehydration, resolved  with IV fluids.  Dementia, Alzheimer's - PT/OT evaluations performed: SNF recommended.  Medical Consultants:    None.   Discharge Exam:   Filed Vitals:   08/08/15 0501  BP: 103/51  Pulse: 79  Temp: 98.3 F (36.8 C)  Resp: 18   Filed Vitals:   08/07/15 1117 08/07/15 1358 08/07/15 2230 08/08/15 0501  BP: 102/49 115/56 119/53 103/51  Pulse: 78 82 92 79  Temp: 97.9 F (36.6 C) 97.9 F (36.6 C) 98.8 F (37.1 C) 98.3 F (36.8 C)  TempSrc: Oral Oral Oral Oral  Resp: 20 20 18 18   Weight:    63.3 kg (139 lb 8.8 oz)  SpO2: 94% 94% 94% 94%    Gen:  NAD Cardiovascular:  RRR, No M/R/G Respiratory: Lungs CTAB Gastrointestinal: Abdomen soft, NT/ND with normal active bowel sounds. Extremities: No C/E/C   The results of significant diagnostics from this hospitalization (including imaging, microbiology, ancillary and laboratory) are listed below for reference.     Procedures and Diagnostic Studies:   Dg Chest 2 View  08/04/2015   CLINICAL DATA:  Decreased oxygen saturation today.  EXAM: CHEST  2 VIEW  COMPARISON:  PA and lateral chest 10/17/2012.  FINDINGS: There is cardiomegaly and extensive bilateral airspace disease with an appearance most compatible with pulmonary edema. Small bilateral pleural effusions are identified. No pneumothorax.  IMPRESSION: Cardiomegaly and bilateral airspace disease likely due to pulmonary edema rather than pneumonia.   Electronically Signed   By: Inge Rise M.D.   On: 08/04/2015 14:36     Labs:   Basic Metabolic Panel:  Recent Labs Lab 08/04/15 1316 08/05/15 0525  NA 134* 136  K 5.1 4.8  CL 101 103  CO2 26 26  GLUCOSE 99 97  BUN 60* 61*  CREATININE 2.66* 2.80*  CALCIUM 10.7* 10.2   GFR Estimated Creatinine Clearance: 10.2 mL/min (by C-G formula based on Cr of 2.8). Liver Function Tests:  Recent Labs Lab 08/04/15 1316  AST 24  ALT 14  ALKPHOS 53  BILITOT 0.4  PROT 7.0  ALBUMIN 2.7*    CBC:  Recent Labs Lab  08/04/15 1316 08/05/15 0525  WBC 7.8 8.3  NEUTROABS 6.3  --   HGB 10.8* 9.6*  HCT 34.4* 31.0*  MCV 91.5 91.7  PLT 363 335   Microbiology Recent Results (from the past 240 hour(s))  Culture, blood (routine x 2)     Status: None (Preliminary result)   Collection Time: 08/04/15 12:55 PM  Result Value Ref Range Status   Specimen Description BLOOD RIGHT ANTECUBITAL  Final   Special Requests BOTTLES DRAWN AEROBIC AND ANAEROBIC 10ML  Final   Culture   Final    NO GROWTH 3 DAYS Performed at [facility]    Report Status PENDING  Incomplete  Culture, blood (routine x 2)     Status: None (Preliminary result)   Collection Time: 08/04/15  1:26 PM  Result Value Ref Range Status   Specimen Description BLOOD LEFT ANTECUBITAL  Final   Special Requests BOTTLES DRAWN AEROBIC AND ANAEROBIC 5ML  Final   Culture   Final    NO GROWTH 3 DAYS Performed at [facility]    Report Status PENDING  Incomplete     Discharge Instructions:   Discharge Instructions    Call MD for:  extreme fatigue    Complete by:  As directed      Call MD for:  persistant dizziness or light-headedness    Complete by:  As directed      Call MD for:  temperature >100.4    Complete by:  As directed      Diet - low sodium heart healthy    Complete by:  As directed      Increase activity slowly    Complete by:  As directed      Walk with assistance    Complete by:  As directed      Walker     Complete by:  As directed             Medication List    TAKE these medications        alendronate 70 MG tablet  Commonly known as:  FOSAMAX  TAKE 1 TABLET BY MOUTH ONCE WEEKLY. TAKE WITH A FULL GLASS OF WATER ON AN EMPTY STOMACH.     aspirin EC 81 MG tablet  Take 81 mg by mouth daily.     azithromycin 250 MG tablet  Commonly known as:  ZITHROMAX  Take 1 tablet (250 mg total) by mouth daily.     cefUROXime 500 MG tablet  Commonly known as:  CEFTIN  Take 1 tablet (500 mg total) by mouth 2 (two)  times daily with a meal.     docusate sodium 100 MG capsule  Commonly known as:  COLACE  Take 100 mg by mouth daily.     feeding supplement (ENSURE ENLIVE) Liqd  Take 237 mLs by mouth 2 (two) times daily between meals.     multivitamin with minerals Tabs tablet  Take 1 tablet by mouth daily.     polyethylene glycol powder powder  Commonly known as:  GLYCOLAX/MIRALAX  TAKE 17 GRAMS DAILY. ( [place]  WITH WATER, JUICE, SODA, TEA OR COFFEE. )     simvastatin 5 MG tablet  Commonly known as:  ZOCOR  TAKE ONE TABLET AT BEDTIME.           Follow-up Information    Follow up with Gildardo Cranker, DO. Schedule an appointment as soon as possible for a visit in 2 weeks.   Specialty:  Internal Medicine   Why:  Hospital follow up of pneumonia.   Contact information:   [place] [id number] [phone]        Time coordinating discharge: 35 minutes.  Signed:  Savon Cobbs  Pager [phone] [facility] 08/08/2015, 8:11 AM

## 2015-08-08 NOTE — Progress Notes (Signed)
Patient discharged to [facility].  Son at bedside.  Transferring via [facility].  Report called to Levada Dy at [facility]. Patient left at 1015 this morning.  Room air.  No complaints.  No s/s of distress.

## 2015-08-08 NOTE — Clinical Social Work Placement (Signed)
   CLINICAL SOCIAL WORK PLACEMENT  NOTE  Date:  08/08/2015  Patient Details  Name: [name] MRN: [id number] Date of Birth: 1919/08/27  Clinical Social Work is seeking post-discharge placement for this patient at the [place] level of care (*CSW will initial, date and re-position this form in  chart as items are completed):  Yes   Patient/family provided with [place] Work Department's list of facilities offering this level of care within the geographic area requested by the patient (or if unable, by the patient's family).  Yes   Patient/family informed of their freedom to choose among providers that offer the needed level of care, that participate in Medicare, Medicaid or managed care program needed by the patient, have an available bed and are willing to accept the patient.  Yes   Patient/family informed of [place]'s ownership interest in [facility] and [facility], as well as of the fact that they are under no obligation to receive care at these facilities.  PASRR submitted to EDS on       PASRR number received on       Existing PASRR number confirmed on       FL2 transmitted to all facilities in geographic area requested by pt/family on 08/06/15     FL2 transmitted to all facilities within larger geographic area on       Patient informed that his/her managed care company has contracts with or will negotiate with certain facilities, including the following:            Patient/family informed of bed offers received.  Patient chooses bed at     [place]  Physician recommends and patient chooses bed at      Patient to be transferred to  [facility]  on  .August 08, 2015  Patient to be transferred to facility by   ambulance    Patient family notified on  August 08, 2015 of transfer.  Name of family member notified:     Charles/son   PHYSICIAN       Additional Comment:     _______________________________________________ Carlean Jews, LCSW 08/08/2015, 10:21 AM

## 2015-08-09 LAB — CULTURE, BLOOD (ROUTINE X 2)
CULTURE: NO GROWTH
Culture: NO GROWTH

## 2015-08-10 NOTE — Clinical Social Work Placement (Signed)
   CLINICAL SOCIAL WORK PLACEMENT  NOTE  Date:  08/10/2015  Patient Details  Name: [name] MRN: [id number] Date of Birth: Nov 25, 1919  Clinical Social Work is seeking post-discharge placement for this patient at the [place] level of care (*CSW will initial, date and re-position this form in  chart as items are completed):  Yes   Patient/family provided with [place] Work Department's list of facilities offering this level of care within the geographic area requested by the patient (or if unable, by the patient's family).  Yes   Patient/family informed of their freedom to choose among providers that offer the needed level of care, that participate in Medicare, Medicaid or managed care program needed by the patient, have an available bed and are willing to accept the patient.  Yes   Patient/family informed of [place]'s ownership interest in [facility] and [facility], as well as of the fact that they are under no obligation to receive care at these facilities.  PASRR submitted to EDS on       PASRR number received on       Existing PASRR number confirmed on       FL2 transmitted to all facilities in geographic area requested by pt/family on 08/06/15     FL2 transmitted to all facilities within larger geographic area on       Patient informed that his/her managed care company has contracts with or will negotiate with certain facilities, including the following:        Yes   Patient/family informed of bed offers received.  Patient chooses bed at [facility]     Physician recommends and patient chooses bed at      Patient to be transferred to [facility] on 08/08/15.  Patient to be transferred to facility by family     Patient family notified on 08/08/15 of transfer.  Name of family member notified:  Family-son     PHYSICIAN       Additional Comment:     _______________________________________________ Boone Master, [place] 08/10/2015, 8:29 AM

## 2015-08-20 ENCOUNTER — Inpatient Hospital Stay (HOSPITAL_COMMUNITY)
Admission: EM | Admit: 2015-08-20 | Discharge: 2015-08-26 | DRG: 291 | Disposition: A | Discharge status: Skilled Nursing Facility | Payer: Medicare Other | Attending: Internal Medicine | Admitting: Internal Medicine

## 2015-08-20 ENCOUNTER — Encounter (HOSPITAL_COMMUNITY): Payer: Self-pay | Admitting: Emergency Medicine

## 2015-08-20 ENCOUNTER — Emergency Department (HOSPITAL_COMMUNITY): Payer: Medicare Other

## 2015-08-20 ENCOUNTER — Telehealth: Payer: Self-pay | Admitting: *Deleted

## 2015-08-20 DIAGNOSIS — G309 Alzheimer's disease, unspecified: Secondary | ICD-10-CM | POA: Diagnosis present

## 2015-08-20 DIAGNOSIS — K088 Other specified disorders of teeth and supporting structures: Secondary | ICD-10-CM | POA: Diagnosis present

## 2015-08-20 DIAGNOSIS — R05 Cough: Secondary | ICD-10-CM

## 2015-08-20 DIAGNOSIS — K219 Gastro-esophageal reflux disease without esophagitis: Secondary | ICD-10-CM | POA: Diagnosis present

## 2015-08-20 DIAGNOSIS — J449 Chronic obstructive pulmonary disease, unspecified: Secondary | ICD-10-CM | POA: Diagnosis present

## 2015-08-20 DIAGNOSIS — Z23 Encounter for immunization: Secondary | ICD-10-CM | POA: Diagnosis not present

## 2015-08-20 DIAGNOSIS — Z961 Presence of intraocular lens: Secondary | ICD-10-CM | POA: Diagnosis present

## 2015-08-20 DIAGNOSIS — R059 Cough, unspecified: Secondary | ICD-10-CM

## 2015-08-20 DIAGNOSIS — E875 Hyperkalemia: Secondary | ICD-10-CM | POA: Diagnosis present

## 2015-08-20 DIAGNOSIS — I313 Pericardial effusion (noninflammatory): Secondary | ICD-10-CM | POA: Diagnosis not present

## 2015-08-20 DIAGNOSIS — Z8249 Family history of ischemic heart disease and other diseases of the circulatory system: Secondary | ICD-10-CM

## 2015-08-20 DIAGNOSIS — F0280 Dementia in other diseases classified elsewhere without behavioral disturbance: Secondary | ICD-10-CM | POA: Diagnosis present

## 2015-08-20 DIAGNOSIS — N8110 Cystocele, unspecified: Secondary | ICD-10-CM

## 2015-08-20 DIAGNOSIS — Z8701 Personal history of pneumonia (recurrent): Secondary | ICD-10-CM | POA: Diagnosis not present

## 2015-08-20 DIAGNOSIS — Z803 Family history of malignant neoplasm of breast: Secondary | ICD-10-CM

## 2015-08-20 DIAGNOSIS — Y95 Nosocomial condition: Secondary | ICD-10-CM | POA: Diagnosis present

## 2015-08-20 DIAGNOSIS — N1330 Unspecified hydronephrosis: Secondary | ICD-10-CM | POA: Diagnosis present

## 2015-08-20 DIAGNOSIS — Z9841 Cataract extraction status, right eye: Secondary | ICD-10-CM | POA: Diagnosis not present

## 2015-08-20 DIAGNOSIS — Z66 Do not resuscitate: Secondary | ICD-10-CM | POA: Diagnosis present

## 2015-08-20 DIAGNOSIS — N184 Chronic kidney disease, stage 4 (severe): Secondary | ICD-10-CM | POA: Diagnosis present

## 2015-08-20 DIAGNOSIS — I5033 Acute on chronic diastolic (congestive) heart failure: Secondary | ICD-10-CM | POA: Diagnosis not present

## 2015-08-20 DIAGNOSIS — R0902 Hypoxemia: Secondary | ICD-10-CM | POA: Diagnosis present

## 2015-08-20 DIAGNOSIS — N179 Acute kidney failure, unspecified: Secondary | ICD-10-CM | POA: Diagnosis not present

## 2015-08-20 DIAGNOSIS — N131 Hydronephrosis with ureteral stricture, not elsewhere classified: Secondary | ICD-10-CM | POA: Diagnosis present

## 2015-08-20 DIAGNOSIS — I5031 Acute diastolic (congestive) heart failure: Secondary | ICD-10-CM | POA: Diagnosis not present

## 2015-08-20 DIAGNOSIS — I248 Other forms of acute ischemic heart disease: Secondary | ICD-10-CM | POA: Diagnosis present

## 2015-08-20 DIAGNOSIS — Z79899 Other long term (current) drug therapy: Secondary | ICD-10-CM | POA: Diagnosis not present

## 2015-08-20 DIAGNOSIS — R531 Weakness: Secondary | ICD-10-CM | POA: Diagnosis present

## 2015-08-20 DIAGNOSIS — J189 Pneumonia, unspecified organism: Secondary | ICD-10-CM | POA: Diagnosis not present

## 2015-08-20 DIAGNOSIS — Z9842 Cataract extraction status, left eye: Secondary | ICD-10-CM

## 2015-08-20 DIAGNOSIS — E785 Hyperlipidemia, unspecified: Secondary | ICD-10-CM | POA: Diagnosis present

## 2015-08-20 DIAGNOSIS — Z7982 Long term (current) use of aspirin: Secondary | ICD-10-CM

## 2015-08-20 LAB — COMPREHENSIVE METABOLIC PANEL
ALK PHOS: 48 U/L (ref 38–126)
ALT: 16 U/L (ref 14–54)
AST: 28 U/L (ref 15–41)
Albumin: 2.6 g/dL — ABNORMAL LOW (ref 3.5–5.0)
Anion gap: 5 (ref 5–15)
BUN: 45 mg/dL — AB (ref 6–20)
CALCIUM: 9.1 mg/dL (ref 8.9–10.3)
CHLORIDE: 105 mmol/L (ref 101–111)
CO2: 24 mmol/L (ref 22–32)
CREATININE: 2.57 mg/dL — AB (ref 0.44–1.00)
GFR calc Af Amer: 17 mL/min — ABNORMAL LOW (ref >60)
GFR, EST NON AFRICAN AMERICAN: 15 mL/min — AB (ref >60)
Glucose, Bld: 95 mg/dL (ref 65–99)
Potassium: 5.3 mmol/L — ABNORMAL HIGH (ref 3.5–5.1)
SODIUM: 134 mmol/L — AB (ref 135–145)
Total Bilirubin: 0.4 mg/dL (ref 0.3–1.2)
Total Protein: 6.6 g/dL (ref 6.5–8.1)

## 2015-08-20 LAB — CBC WITH DIFFERENTIAL/PLATELET
BASOS ABS: 0 10*3/uL (ref 0.0–0.1)
Basophils Relative: 1 %
EOS PCT: 3 %
Eosinophils Absolute: 0.3 10*3/uL (ref 0.0–0.7)
HCT: 32.2 % — ABNORMAL LOW (ref 36.0–46.0)
HEMOGLOBIN: 10.2 g/dL — AB (ref 12.0–15.0)
LYMPHS ABS: 1.2 10*3/uL (ref 0.7–4.0)
LYMPHS PCT: 15 %
MCH: 29.1 pg (ref 26.0–34.0)
MCHC: 31.7 g/dL (ref 30.0–36.0)
MCV: 91.7 fL (ref 78.0–100.0)
Monocytes Absolute: 0.7 10*3/uL (ref 0.1–1.0)
Monocytes Relative: 9 %
NEUTROS PCT: 72 %
Neutro Abs: 6 10*3/uL (ref 1.7–7.7)
PLATELETS: 363 10*3/uL (ref 150–400)
RBC: 3.51 MIL/uL — AB (ref 3.87–5.11)
RDW: 15 % (ref 11.5–15.5)
WBC: 8.2 10*3/uL (ref 4.0–10.5)

## 2015-08-20 LAB — TROPONIN I
Troponin I: 0.05 ng/mL — ABNORMAL HIGH (ref <0.031)
Troponin I: 0.05 ng/mL — ABNORMAL HIGH (ref <0.031)

## 2015-08-20 LAB — BRAIN NATRIURETIC PEPTIDE: B Natriuretic Peptide: 135.9 pg/mL — ABNORMAL HIGH (ref 0.0–100.0)

## 2015-08-20 MED ORDER — DOCUSATE SODIUM 100 MG PO CAPS
100.0000 mg | ORAL_CAPSULE | Freq: Every day | ORAL | Status: DC
  Administered 2015-08-21 – 2015-08-23 (×3): 100 mg via ORAL
  Filled 2015-08-20 (×3): qty 1

## 2015-08-20 MED ORDER — CALCIUM CARBONATE 1250 (500 CA) MG PO TABS
1.0000 | ORAL_TABLET | Freq: Every day | ORAL | Status: DC
  Administered 2015-08-21 – 2015-08-26 (×6): 500 mg via ORAL
  Filled 2015-08-20 (×6): qty 1

## 2015-08-20 MED ORDER — POLYETHYLENE GLYCOL 3350 17 G PO PACK
17.0000 g | PACK | Freq: Every day | ORAL | Status: DC
  Administered 2015-08-21 – 2015-08-23 (×3): 17 g via ORAL
  Filled 2015-08-20 (×3): qty 1

## 2015-08-20 MED ORDER — PIPERACILLIN-TAZOBACTAM IN DEX 2-0.25 GM/50ML IV SOLN
2.2500 g | Freq: Three times a day (TID) | INTRAVENOUS | Status: DC
  Administered 2015-08-21 – 2015-08-23 (×7): 2.25 g via INTRAVENOUS
  Filled 2015-08-20 (×8): qty 50

## 2015-08-20 MED ORDER — ASPIRIN EC 81 MG PO TBEC
81.0000 mg | DELAYED_RELEASE_TABLET | Freq: Every day | ORAL | Status: DC
Start: 2015-08-21 — End: 2015-08-26
  Administered 2015-08-21 – 2015-08-26 (×6): 81 mg via ORAL
  Filled 2015-08-20 (×6): qty 1

## 2015-08-20 MED ORDER — SIMVASTATIN 5 MG PO TABS
5.0000 mg | ORAL_TABLET | Freq: Every day | ORAL | Status: DC
  Administered 2015-08-20 – 2015-08-25 (×6): 5 mg via ORAL
  Filled 2015-08-20 (×6): qty 1

## 2015-08-20 MED ORDER — VANCOMYCIN HCL IN DEXTROSE 1-5 GM/200ML-% IV SOLN
1000.0000 mg | INTRAVENOUS | Status: DC
  Administered 2015-08-22: 1000 mg via INTRAVENOUS
  Filled 2015-08-20: qty 200

## 2015-08-20 MED ORDER — FUROSEMIDE 10 MG/ML IJ SOLN
40.0000 mg | Freq: Once | INTRAMUSCULAR | Status: AC
  Administered 2015-08-20: 40 mg via INTRAVENOUS
  Filled 2015-08-20: qty 4

## 2015-08-20 MED ORDER — ENOXAPARIN SODIUM 30 MG/0.3ML ~~LOC~~ SOLN
30.0000 mg | SUBCUTANEOUS | Status: DC
  Administered 2015-08-20 – 2015-08-25 (×6): 30 mg via SUBCUTANEOUS
  Filled 2015-08-20 (×6): qty 0.3

## 2015-08-20 MED ORDER — PIPERACILLIN-TAZOBACTAM IN DEX 2-0.25 GM/50ML IV SOLN
2.2500 g | INTRAVENOUS | Status: AC
  Administered 2015-08-20: 2.25 g via INTRAVENOUS
  Filled 2015-08-20: qty 50

## 2015-08-20 MED ORDER — ALENDRONATE SODIUM 70 MG PO TABS: 70.0000 mg | ORAL_TABLET | ORAL | Status: DC

## 2015-08-20 MED ORDER — ENSURE ENLIVE PO LIQD
237.0000 mL | Freq: Two times a day (BID) | ORAL | Status: DC
  Administered 2015-08-21 – 2015-08-26 (×6): 237 mL via ORAL

## 2015-08-20 MED ORDER — VANCOMYCIN HCL IN DEXTROSE 1-5 GM/200ML-% IV SOLN
1000.0000 mg | INTRAVENOUS | Status: AC
  Administered 2015-08-20: 1000 mg via INTRAVENOUS
  Filled 2015-08-20: qty 200

## 2015-08-20 NOTE — Progress Notes (Signed)

## 2015-08-20 NOTE — H&P (Signed)
History and Physical  [name]  JQB:[id number]  DOB: May 04, 1919  DOA: 08/20/2015  Referring physician: Dorise Bullion, M.D. PCP: Gildardo Cranker, DO   Chief Complaint: "She was weak when working with physical therapy."  HPI: [name] is a 81 y.o. female with a past medical history significant for dementia, severe bladder prolapse, recent admission for pneumonia and CHF and CKD stage III who presents with dyspnea on exertion for a week.  The patient was admitted at the beginning of September for 4 days for pneumonia and acute diastolic CHF. She was discharged to a nursing home where she had been [place], until this week when her physical therapist noticed that she was more breathless with exertion than normal, and even with standing for a prolonged period of time. That person checked SPO2 today and noticed that the patient was hypoxic in the 70s, and so the patient was sent to the ER. His just collected primarily from the patient's son as the patient has dementia.  Per the son, the patient has not had increased cough, increased purulent sputum, or fever. She just seems to be tired and breathless with any sort of exertion. She also notes that during her last hospitalization she really seemed to get better after she was given Lasix.  Hospitalization she had an echocardiogram that showed an ejection fraction of 65%.  In the ED, the patient was started on vancomycin and Zosyn for HCAP after a CT of the chest showed a multifocal pneumonia versus fluid overload. She did not have an elevated white count, fever, or elevated lactic acid. She did have an elevated BNP, and minimally elevated troponin. The potassium was elevated.   Review of Systems:  Patient seen 8:09 PM on 08/20/2015. Pt complains of the urge to urinate. Pt denies any shortness of breath at rest, chest pain, leg swelling, orthopnea. She also denies fever, cough, productive cough.  Twelve systems were reviewed and were negative  except as noted above in the history of present illness.  Past Medical History  Diagnosis Date  . Prolapsed bladder   . Rectal prolapse   . Hyperlipidemia   . Chronic kidney disease (CKD), stage III (moderate) 06/27/2012  . Bladder prolapse, female, acquired 06/27/2012  . Prolapsed internal hemorrhoids 06/27/2012  . Unspecified vitamin D deficiency   . Alzheimer's disease   . Anemia   . Hydronephrosis   . Personal history of fall   . Rectal polyp, excised 11/22/2012 01/01/2013   The above past medical history was reviewed. Past Surgical History  Procedure Laterality Date  . Appendectomy    . Joint replacement      lt hip  . Cataract extraction w/ intraocular lens  implant, bilateral    . Colonoscopy  11/22/2012    Procedure: COLONOSCOPY;  Surgeon: Shann Medal, MD;  Location: [facility];  Service: General;  Laterality: N/A;  . Polypectomy  11/22/2012    Procedure: POLYPECTOMY;  Surgeon: Shann Medal, MD;  Location: [facility];  Service: General;;  . Bilateral wrist surgeries     The above surgical history was reviewed.  Social History:  reports that she has never smoked. She has never used smokeless tobacco. She reports that she does not drink alcohol or use illicit drugs. Patient lives currently at [facility]. Prior to that she lives with her son. She is a nonsmoker.  No Known Allergies  Family History  Problem Relation Age of Onset  . Cancer Sister     breast  .  Heart attack Father    no family history of congestive heart failure.  Prior to Admission medications   Medication Sig Start Date End Date Taking? Authorizing Provider  alendronate (FOSAMAX) 70 MG tablet Take 70 mg by mouth every Wednesday. Take with a full glass of water on an empty stomach.   Yes Historical Provider, MD  aspirin EC 81 MG tablet Take 81 mg by mouth daily.   Yes Historical Provider, MD  calcium carbonate (OS-CAL - DOSED IN MG OF ELEMENTAL CALCIUM) 1250 (500 CA) MG tablet Take 1 tablet by mouth  daily.   Yes Historical Provider, MD  docusate sodium (COLACE) 100 MG capsule Take 100 mg by mouth daily.   Yes Historical Provider, MD  feeding supplement, ENSURE ENLIVE, (ENSURE ENLIVE) LIQD Take 237 mLs by mouth 2 (two) times daily between meals. 08/08/15  Yes Venetia Maxon Rama, MD  Multiple Vitamin (MULTIVITAMIN WITH MINERALS) TABS Take 1 tablet by mouth daily.   Yes Historical Provider, MD  polyethylene glycol (MIRALAX / GLYCOLAX) packet Take 17 g by mouth daily.   Yes Historical Provider, MD  simvastatin (ZOCOR) 5 MG tablet Take 5 mg by mouth at bedtime.   Yes Historical Provider, MD    Physical Exam: BP 145/81 mmHg  Pulse 93  Temp(Src) 97.2 F (36.2 C) (Oral)  Resp 18  SpO2 94% General: Elderly adult female, alert and in no distress.  Responds appropriately to questions.  Eye contact, dress and hygiene appropriate. HEENT: Head normal.  Corneas clear, conjunctivae and sclerae normal without injection or icterus, lids and lashes normal.  PERRL and EOMI.  Nose normal.  OP moist without erythema, exudates, cobblestoning, or ulcers.  No airway deformities.  Neck supple. No thyromegaly.  Skin: Warm and dry.  No jaundice.  No suspicious rashes or lesions. Cardiac: RRR, nl S1-S2, 2/6 SEM appreciated at LUSB.  The JVP does not appear elevated.  Capillary refill is less than 2 seconds.  No LE edema.  Radial and DP pulses 2+ and symmetric. Respiratory: Normal respiratory rate and rhythm.  No wheezes.  Rales bilaterally up to mid-back. Abdomen: BS present.  No TTP or rebound all quadrants.  No masses or organomegaly.  No ascites, distension. Neuro: Sensorium intact.  Cranial nerves 3-12 intact.  Speech is fluent.  Naming is grossly intact, but the patient's recall, recent and remote, as well as general fund of knowledge seem moderately impaired.  Alert and oriented to place and person, but not time.  Muscle bulk diminished.   Moves all extremities equally and with normal coordination.    Psych:  Appropriate affect.  Normal rate and rhythm of speech.  Thought content appropriate, and thought process linear.  No evidence of aural or visual hallucinations or delusions. Attention and concentration are normal.           Labs on Admission:  The metabolic panel is notable for mild hyponatremia, mild hyperkalemia, normal bicarbonate, creatinine of 2.57 mg/dL which is at baseline. Normal liver function tests. The complete blood count is notable for absence of leukocytosis, normocytic anemia, chronic, and normal platelets. The troponins 0.05 ng/mL. The BNP is 135 pg/mL.   BNP (last 3 results)  Recent Labs  08/04/15 1316 08/20/15 1511  BNP 101.3* 135.9*     Radiological Exams on Admission: Personally reviewed: Dg Chest 2 View 08/20/2015    Vascular congestion versus multifocal pneumonia versus combination of both.  Ct Chest Wo Contrast 08/20/2015    IMPRESSION:  1. Bilateral multilobar pneumonia and/or  chronic changes due to bronchiectasis. 2. Chronic bronchitic changes and mild changes of COPD. 3. Chronic right hydronephrosis with cortical thinning, most likely explaining low density of the included portion of the upper pole of the right kidney.  6. Small pericardial effusion, small left pleural effusion and minimal right pleural effusion.      EKG: Independently reviewed. First-degree AVB.  Assessment/Plan Present on Admission:  . HCAP (healthcare-associated pneumonia) . Alzheimer's disease . Bladder prolapse, female, acquired . Chronic kidney disease (CKD), stage IV (severe) . Acute diastolic heart failure   1. Acute diastolic heart failure:  This appears to be the primary driver of the patient's hypoxia and shortness of breath. Her baseline ejection fraction was measured at the beginning of this month and was 65%. She has not been on a beta blocker or an ACE inhibitor, because she has diastolic heart failure. -Furosemide 40 mg IV daily  -BMP -Daily weights, and I's  and O's -Activate heart failure clinical pathway   2. Possible HCAP: This is recurrent, if present. However the patient does not have a fever, or leukocytosis, and she does not appear to have new symptoms of pneumonia. -Continue vancomycin and Zosyn -Pro-calcitonin protocol with low threshold to discontinue antibiotics   3. Elevated troponin, suspected demand ischemia, type 2 NSTEMI: This low level elevation is suspected to be from heart failure.  ACS is doubted. -Trend troponin -Continue home statin and aspirin   4. Hyperkalemia: Mild, and without EKG changes. -Furosemide as above -BMP trend   5. Chronic bladder prolapse: The patient's son reports that she got no medical care between about the ages of 61 and 89. During that time she developed a severe bladder prolapse. If daily diuresis is to be a component of this patient's long-term management her bladder prolapse could be a significant obstacle. She's been seen and evaluated for a pessary in the past, and this was recommended, but she refused. -The patient's son requested if it was possible that the patient could be seen by urology in consultation while in the hospital to possibly get a pessary.      DVT PPx: Lovenox  Diet: Heart healthy  Code Status: DO NOT RESUSCITATE  Family Communication: The case was discussed with the patient's son was present at the bedside, and all questions were answered. The patient's CODE STATUS was clarified with the son.   Disposition Plan:  The appropriate admission status for this patient is INPATIENT. Inpatient status is judged to be reasonable and necessary in order to provide the required intensity of service to ensure the patient's safety. The patient's presenting symptoms, physical exam findings, and initial radiographic and laboratory data in the context of their chronic comorbidities is felt to place them at high risk for further clinical deterioration. Furthermore, it is not  anticipated that the patient will be medically stable for discharge from the hospital within 2 midnights of admission. The following factors support the admission status of inpatient.   A. The patient's presenting symptoms include shortness of breath with exertion. B. The worrisome physical exam findings include rales. C. The initial radiographic and laboratory data are worrisome because of lung opacities on CT and x-ray, elevated troponin from demand, elevated potassium. D. The chronic co-morbidities include chronic diastolic heart failure, bladder prolapse. E. Patient requires inpatient status due to high intensity of service, high risk for further deterioration and high frequency of surveillance required. F. I certify that at the point of admission it is my clinical judgment that the patient  will require inpatient hospital care spanning beyond 2 midnights from the point of admission.     Edwin Dada [facility] Pager [phone]

## 2015-08-20 NOTE — ED Notes (Signed)
Pt O2 dropped down to the low 70s while ambulating

## 2015-08-20 NOTE — Telephone Encounter (Signed)
[facility] staff called the office between [employer] two notes and spoke with Ivin Booty, [place].  They mentioned that pt's sats were 70s and when she asked if they still were or had they put her on oxygen, they said they were 68 now.  She again advised them to send the patient to the ED.  They asked why we had not answered faxes about this.  We have not received any faxes even as of now (455pm).  Fortunately, I see that the patient was sent to the ED and is being treated for pneumonia.

## 2015-08-20 NOTE — ED Provider Notes (Signed)
CSN: [id number]     Arrival date & time 08/20/15  1414 History   First MD Initiated Contact with Patient 08/20/15 1438     Chief Complaint  Patient presents with  . Pneumonia     (Consider location/radiation/quality/duration/timing/severity/associated sxs/prior Treatment) Patient is a 81 y.o. female presenting with shortness of breath.  Shortness of Breath Severity:  Moderate Onset quality:  Gradual Duration:  2 days Progression:  Worsening Chronicity:  New Context: not activity and not URI   Relieved by:  None tried Worsened by:  Nothing tried Ineffective treatments:  None tried Associated symptoms: cough   Associated symptoms: no abdominal pain, no fever, no neck pain, no rash, no sore throat and no wheezing     Past Medical History  Diagnosis Date  . Prolapsed bladder   . Rectal prolapse   . Hyperlipidemia   . Chronic kidney disease (CKD), stage III (moderate) 06/27/2012  . Bladder prolapse, female, acquired 06/27/2012  . Prolapsed internal hemorrhoids 06/27/2012  . Unspecified vitamin D deficiency   . Alzheimer's disease   . Anemia   . Hydronephrosis   . Personal history of fall   . Rectal polyp, excised 11/22/2012 01/01/2013   Past Surgical History  Procedure Laterality Date  . Appendectomy    . Joint replacement      lt hip  . Cataract extraction w/ intraocular lens  implant, bilateral    . Colonoscopy  11/22/2012    Procedure: COLONOSCOPY;  Surgeon: Shann Medal, MD;  Location: [facility];  Service: General;  Laterality: N/A;  . Polypectomy  11/22/2012    Procedure: POLYPECTOMY;  Surgeon: Shann Medal, MD;  Location: [facility];  Service: General;;  . Bilateral wrist surgeries     Family History  Problem Relation Age of Onset  . Cancer Sister     breast   Social History  Substance Use Topics  . Smoking status: Never Smoker   . Smokeless tobacco: Never Used  . Alcohol Use: No   OB History    No data available     Review of Systems  Constitutional:  Negative for fever and chills.  HENT: Negative for sore throat.   Respiratory: Positive for cough and shortness of breath. Negative for wheezing.   Gastrointestinal: Negative for abdominal pain.  Endocrine: Negative for polydipsia and polyuria.  Genitourinary: Negative for dysuria.  Musculoskeletal: Negative for back pain and neck pain.  Skin: Negative for rash.  All other systems reviewed and are negative.     Allergies  Review of patient's allergies indicates no known allergies.  Home Medications   Prior to Admission medications   Medication Sig Start Date End Date Taking? Authorizing Provider  alendronate (FOSAMAX) 70 MG tablet TAKE 1 TABLET BY MOUTH ONCE WEEKLY. TAKE WITH A FULL GLASS OF WATER ON AN EMPTY STOMACH. 05/08/15   Gildardo Cranker, DO  aspirin EC 81 MG tablet Take 81 mg by mouth daily.    Historical Provider, MD  docusate sodium (COLACE) 100 MG capsule Take 100 mg by mouth daily.    Historical Provider, MD  feeding supplement, ENSURE ENLIVE, (ENSURE ENLIVE) LIQD Take 237 mLs by mouth 2 (two) times daily between meals. 08/08/15   Venetia Maxon Rama, MD  Multiple Vitamin (MULTIVITAMIN WITH MINERALS) TABS Take 1 tablet by mouth daily.    Historical Provider, MD  polyethylene glycol powder (GLYCOLAX/MIRALAX) powder TAKE 17 GRAMS DAILY. ( MIX WITH WATER, JUICE, SODA, TEA OR COFFEE. ) 06/18/15   Gildardo Cranker, DO  simvastatin (ZOCOR) 5 MG tablet TAKE ONE TABLET AT BEDTIME. 01/30/15   Monica Carter, DO   BP 135/69 mmHg  Pulse 93  Temp(Src) 98.3 F (36.8 C) (Oral)  Resp 16  SpO2 95% Physical Exam  Constitutional: She is oriented to person, place, and time. She appears well-developed and well-nourished.  HENT:  Head: Normocephalic and atraumatic.  Eyes: Conjunctivae and EOM are normal. Right eye exhibits no discharge. Left eye exhibits no discharge.  Cardiovascular: Normal rate and regular rhythm.   Pulmonary/Chest: Effort normal. Tachypnea noted. No respiratory distress. She has  rales.  Abdominal: Soft. She exhibits no distension. There is no tenderness. There is no rebound.  Musculoskeletal: Normal range of motion. She exhibits no edema or tenderness.  Neurological: She is alert and oriented to person, place, and time.  Skin: Skin is warm and dry.  Nursing note and vitals reviewed.   ED Course  Procedures (including critical care time) Labs Review Labs Reviewed - No data to display  Imaging Review No results found. I have personally reviewed and evaluated these images and lab results as part of my medical decision-making.   EKG Interpretation None      MDM   Final Diagnosis:  Pneumonia Hypoxia   Worsening hypoxia at rehab after recently recovering from pneumonia. Rales in both bases. Slight tachypnea on exam.  Will eval for worsenign pneumonia v chf exacerbation.  CT with likely pneumonia and possible fluid overload as well. D/W hospitalist for admission.     Merrily Pew, MD 08/21/15 [phone]

## 2015-08-20 NOTE — ED Notes (Signed)
Pt was placed on 2L of O2.

## 2015-08-20 NOTE — ED Notes (Signed)
Pt was receiving Physical therapy and the PT became concerned over a low SPO2.  Pt arrived by EMS and O2 sat was 95% on room air. Pt alert and oriented. Pt was recently dx with pneumonia.

## 2015-08-20 NOTE — Telephone Encounter (Signed)
Patient son, [name] called and stated that his mother's oxygen is low at 39. Stated he needed an order from [place] on what to do to give to [facility]. I spoke with Dr. Mariea Clonts and she stated that with that low of oxygen patient needed to go to the ER to be evaluated. Son notified and agreed.

## 2015-08-20 NOTE — Progress Notes (Signed)
ANTIBIOTIC CONSULT NOTE - INITIAL  Pharmacy Consult for Vancomycin / Zosyn Indication: HCAP  No Known Allergies  Patient Measurements:   Adjusted Body Weight:   Vital Signs: Temp: 98.3 F (36.8 C) (09/22 1422) Temp Source: Oral (09/22 1422) BP: 125/71 mmHg (09/22 1714) Pulse Rate: 82 (09/22 1714) Intake/Output from previous day:   Intake/Output from this shift:    Labs:  Recent Labs  08/20/15 1511  WBC 8.2  HGB 10.2*  PLT 363  CREATININE 2.57*   Estimated Creatinine Clearance: 11.1 mL/min (by C-G formula based on Cr of 2.57). No results for input(s): VANCOTROUGH, VANCOPEAK, VANCORANDOM, GENTTROUGH, GENTPEAK, GENTRANDOM, TOBRATROUGH, TOBRAPEAK, TOBRARND, AMIKACINPEAK, AMIKACINTROU, AMIKACIN in the last 72 hours.   Microbiology: Recent Results (from the past 720 hour(s))  Culture, blood (routine x 2)     Status: None   Collection Time: 08/04/15 12:55 PM  Result Value Ref Range Status   Specimen Description BLOOD RIGHT ANTECUBITAL  Final   Special Requests BOTTLES DRAWN AEROBIC AND ANAEROBIC 10ML  Final   Culture   Final    NO GROWTH 5 DAYS Performed at [facility]    Report Status 08/09/2015 FINAL  Final  Culture, blood (routine x 2)     Status: None   Collection Time: 08/04/15  1:26 PM  Result Value Ref Range Status   Specimen Description BLOOD LEFT ANTECUBITAL  Final   Special Requests BOTTLES DRAWN AEROBIC AND ANAEROBIC 5ML  Final   Culture   Final    NO GROWTH 5 DAYS Performed at [facility]    Report Status 08/09/2015 FINAL  Final    Medical History: Past Medical History  Diagnosis Date  . Prolapsed bladder   . Rectal prolapse   . Hyperlipidemia   . Chronic kidney disease (CKD), stage III (moderate) 06/27/2012  . Bladder prolapse, female, acquired 06/27/2012  . Prolapsed internal hemorrhoids 06/27/2012  . Unspecified vitamin D deficiency   . Alzheimer's disease   . Anemia   . Hydronephrosis   . Personal history of fall   .  Rectal polyp, excised 11/22/2012 01/01/2013   Assessment: 39 yoF with recent hospitalization for acute respiratory failure secondary to CAP presents 9/22 from SNF with oxygen saturation in 70's on RA.  CXR shows bilateral multilobar PNA. Pharmacy consulted to start Vancomycin and Zosyn for HCAP.    Anti-infectives 9/22 >> Vancomycin  >> 9/22 >> Zosyn  >>    Vitals/Labs WBC: WNL Tm24h: 98.3 Renal: AoCKD - SCr 2.57, CrCl ~11  Cultures None ordered yet  Goal of Therapy:  Vancomycin trough level 15-20 mcg/ml  Eradication of infection  Plan:  Zosyn 2.25g IV q8h Vancomycin 1g IV q48h F/u renal function, vancomycin trough as warranted, cultures, clinical course  Ralene Bathe, PharmD, BCPS 08/20/2015, 7:29 PM  Pager: [phone]

## 2015-08-20 NOTE — ED Notes (Signed)
Bed: WA02 Expected date:  Expected time:  Means of arrival:  Comments: 81yo-shortness of breath

## 2015-08-20 NOTE — Telephone Encounter (Signed)
Patient son, [name] walked in wanting explaination of Chest X-ray. I spoke with Dr. Mariea Clonts and with Oxygen in the 70's he needs to take her to the ER. Explained this to him. He stated that we should have received an x-ray today. I haven't seen one come across and Dr. Mariea Clonts hasn't seen one. Told him that she would need to go to ER anyway with that low of an oxygen. He agreed and will take her. Ivin Booty spoke with [facility] and they are aware also.

## 2015-08-21 LAB — BASIC METABOLIC PANEL
Anion gap: 7 (ref 5–15)
BUN: 42 mg/dL — AB (ref 6–20)
CALCIUM: 8.7 mg/dL — AB (ref 8.9–10.3)
CO2: 25 mmol/L (ref 22–32)
CREATININE: 2.66 mg/dL — AB (ref 0.44–1.00)
Chloride: 105 mmol/L (ref 101–111)
GFR calc Af Amer: 17 mL/min — ABNORMAL LOW (ref >60)
GFR, EST NON AFRICAN AMERICAN: 14 mL/min — AB (ref >60)
GLUCOSE: 90 mg/dL (ref 65–99)
Potassium: 4.6 mmol/L (ref 3.5–5.1)
Sodium: 137 mmol/L (ref 135–145)

## 2015-08-21 LAB — TROPONIN I
TROPONIN I: 0.05 ng/mL — AB (ref <0.031)
Troponin I: 0.06 ng/mL — ABNORMAL HIGH (ref <0.031)

## 2015-08-21 LAB — MRSA PCR SCREENING: MRSA by PCR: NEGATIVE

## 2015-08-21 LAB — PROCALCITONIN: Procalcitonin: <0.1 ng/mL

## 2015-08-21 MED ORDER — FUROSEMIDE 10 MG/ML IJ SOLN
40.0000 mg | Freq: Every day | INTRAMUSCULAR | Status: DC
  Administered 2015-08-21: 40 mg via INTRAVENOUS
  Filled 2015-08-21: qty 4

## 2015-08-21 MED ORDER — FUROSEMIDE 40 MG PO TABS
40.0000 mg | ORAL_TABLET | Freq: Every day | ORAL | Status: DC
  Administered 2015-08-22 – 2015-08-23 (×2): 40 mg via ORAL
  Filled 2015-08-21 (×3): qty 1

## 2015-08-21 MED ORDER — CHLORHEXIDINE GLUCONATE 0.12 % MT SOLN
15.0000 mL | Freq: Two times a day (BID) | OROMUCOSAL | Status: DC
  Administered 2015-08-21 – 2015-08-26 (×9): 15 mL via OROMUCOSAL
  Filled 2015-08-21 (×10): qty 15

## 2015-08-21 MED ORDER — HYDROCODONE-ACETAMINOPHEN 5-325 MG PO TABS
1.0000 | ORAL_TABLET | Freq: Once | ORAL | Status: AC
  Administered 2015-08-21: 1 via ORAL
  Filled 2015-08-21: qty 1

## 2015-08-21 MED ORDER — CETYLPYRIDINIUM CHLORIDE 0.05 % MT LIQD
7.0000 mL | Freq: Two times a day (BID) | OROMUCOSAL | Status: DC
  Administered 2015-08-22 – 2015-08-23 (×4): 7 mL via OROMUCOSAL

## 2015-08-21 NOTE — Evaluation (Addendum)
Clinical/Bedside Swallow Evaluation Patient Details  Name: [name] MRN: [id number] Date of Birth: [birth year]  Today's Date: 08/21/2015 Time: SLP Start Time (ACUTE ONLY): 1446 SLP Stop Time (ACUTE ONLY): 1513 SLP Time Calculation (min) (ACUTE ONLY): 27 min  Past Medical History:  Past Medical History  Diagnosis Date  . Prolapsed bladder   . Rectal prolapse   . Hyperlipidemia   . Chronic kidney disease (CKD), stage III (moderate) 06/27/2012  . Bladder prolapse, female, acquired 06/27/2012  . Prolapsed internal hemorrhoids 06/27/2012  . Unspecified vitamin D deficiency   . Alzheimer's disease   . Anemia   . Hydronephrosis   . Personal history of fall   . Rectal polyp, excised 11/22/2012 01/01/2013   Past Surgical History:  Past Surgical History  Procedure Laterality Date  . Appendectomy    . Joint replacement      lt hip  . Cataract extraction w/ intraocular lens  implant, bilateral    . Colonoscopy  11/22/2012    Procedure: COLONOSCOPY;  Surgeon: Shann Medal, MD;  Location: [facility];  Service: General;  Laterality: N/A;  . Polypectomy  11/22/2012    Procedure: POLYPECTOMY;  Surgeon: Shann Medal, MD;  Location: [facility];  Service: General;;  . Bilateral wrist surgeries     HPI:  81 y.o. female with a past medical history significant for dementia, severe bladder prolapse, recent admission for pneumonia (early September 9/4-08/07/15) and CHF and CKD stage III who presents with dyspnea on exertion for a week.   Assessment / Plan / Recommendation Clinical Impression   Pt with relatively normal oropharyngeal swallow with difficulty with mastication d/t poor dentition and refusal of solids d/t pain with mastication; no overt s/s of aspiration noted during BSE and nursing reports no difficulty with breakfast tray earlier in a.m.; son reports intermittent coughing with meals and without d/t speaking attempts during meals.  Pt is a resident at [facility] who is currently being  followed by all disciplines including [facility].  Recommend f/u at next venue of care and soft diet with thin liquids with general swallowing precautions; Chest CT on 08/20/15 indicated Bilateral multilobar pneumonia and/or chronic changes due to bronchiectasis.    Aspiration Risk  Mild    Diet Recommendation Dysphagia 3 (Mech soft);Thin   Medication Administration: Whole meds with liquid    Other  Recommendations Oral Care Recommendations: Oral care BID   Follow Up Recommendations    None   Frequency and Duration   n/a     Pertinent Vitals/Pain WDL    SLP Swallow Goals  n/a   Swallow Study Prior Functional Status   [facility] SNF/Rehab    General Date of Onset: 08/20/15 Other Pertinent Information: 81 y.o. female with a past medical history significant for dementia, severe bladder prolapse, recent admission for pneumonia and CHF and CKD stage III who presents with dyspnea on exertion for a week. Type of Study: Bedside swallow evaluation Diet Prior to this Study: Dysphagia 3 (soft);Thin liquids Temperature Spikes Noted: No Respiratory Status: Supplemental O2 delivered via (comment) History of Recent Intubation: No Behavior/Cognition: Alert;Cooperative Oral Cavity - Dentition: Poor condition;Missing dentition Self-Feeding Abilities: Able to feed self Patient Positioning: Upright in bed Baseline Vocal Quality: Low vocal intensity Volitional Cough: Strong Volitional Swallow: Able to elicit    Oral/Motor/Sensory Function Overall Oral Motor/Sensory Function: Appears within functional limits for tasks assessed   Ice Chips Ice chips: Not tested   Thin Liquid Thin Liquid: Within functional limits Presentation: Cup;Straw  Nectar Thick Nectar Thick Liquid: Not tested   Honey Thick Honey Thick Liquid: Not tested   Puree Puree: Within functional limits   Solid       Solid: Not tested Other Comments:  (Pt declined d/t pain with mastication d/t poor dentition)        Ketina Mars,PAT, M.S., CCC-SLP 08/21/2015,3:26 PM

## 2015-08-21 NOTE — Progress Notes (Signed)
Initial Nutrition Assessment  DOCUMENTATION CODES:   Not applicable  INTERVENTION:  Ensure Enlive po BID, each supplement provides 350 kcal and 20 grams of protein    NUTRITION DIAGNOSIS:   Predicted suboptimal nutrient intake related to lethargy/confusion as evidenced by per patient/family report.    GOAL:   Patient will meet greater than or equal to 90% of their needs    MONITOR:   PO intake, Supplement acceptance, Weight trends, Labs, I & O's  REASON FOR ASSESSMENT:   Malnutrition Screening Tool    ASSESSMENT:   81 yo female presented for weakness during physical therapy. Pmh of dementia, severe bladder prolapse, recent admission for pneumonia and CHF and CKD stage III. Pt was admitted on September 4 for PNA and acute on chronic CHF. Was rehabbing with physical therapist when PT noticed shortness of breath. Pt  had trouble recalling history, did not exhibit malnutrition, except at temples. Said she has not lost much weight, chart shows 13#/8% loss in 4 months. Will provide Ensure Enlive to maintain weight. Pt is at risk for continued weight loss. Monitor supplement acceptance.   Diet Order:  Diet Heart Room service appropriate?: Yes; Fluid consistency:: Thin  Skin:  Reviewed, no issues  Last BM:  9/22  Height:   Ht Readings from Last 1 Encounters:  05/08/15 5' (1.524 m)    Weight:   Wt Readings from Last 1 Encounters:  08/21/15 135 lb 9.3 oz (61.5 kg)    Ideal Body Weight:  45.45 kg  BMI:  Body mass index is 26.48 kg/(m^2).  Estimated Nutritional Needs:   Kcal:  1200-1400  Protein:  60-70 grams  Fluid:  2 L/day  EDUCATION NEEDS:   No education needs identified at this time  Satira Anis. Avonell Lenig, MS, RD LDN After Hours/Weekend Pager [phone]

## 2015-08-21 NOTE — Clinical Social Work Note (Signed)
Clinical Social Work Assessment  Patient Details  Name: [name] MRN: [id number] Date of Birth: 02/02/1919  Date of referral:  08/21/15               Reason for consult:  Discharge Planning                Permission sought to share information with:  Family Supports Permission granted to share information::  Yes, Verbal Permission Granted  Name::     [name]  Agency::     Relationship::  son  Contact Information:  [phone]  Housing/Transportation Living arrangements for the past 2 months:  [place] of Information:  Adult Children Patient Interpreter Needed:  None Criminal Activity/Legal Involvement Pertinent to Current Situation/Hospitalization:  No - Comment as needed Significant Relationships:  Adult Children Lives with:  Facility Resident Do you feel safe going back to the place where you live?  Yes Need for family participation in patient care:  Yes (Comment)  Care giving concerns:  Pt admitted from [facility]. Pt son does not identify any care giving concerns at this time.   Social Worker assessment / plan:  CSW received referral that pt admitted from [facility].   CSW met with pt and pt son at bedside. Pt sitting up in hospital bed eating breakfast at this time. CSW introduced self and explained role. Pt son confirmed that pt is a resident of [facility]. Pt son states that pt went to [facility] three weeks ago after pt care became more that pt son could manage at home. Pt son states that plan is to return to [facility] when pt medically ready for discharge. CSW provided supportive listening as pt son discussed pt experiences at [facility]. Pt son reports that pt was not on oxygen prior to admission, so it will be necessary to evaluate if pt needs oxygen upon discharge.   CSW contacted [facility] and spoke with Levada Dy. [facility] confirmed that pt can return when medically ready  for discharge.[facility] stated that facility can accept pt back during the weekend if medically ready and for weekend CSW to contact facility to discuss if needed.   CSW completed FL2.   CSW to continue to follow to provide support and assist with pt discharge planning needs back to [facility] when medically ready for discharge.  Employment status:  Retired Forensic scientist:  Medicare PT Recommendations:  Not assessed at this time [place] / Referral to community resources:   (Referral back to [facility])  Patient/Family's Response to care:  Pt alert and oriented x 2. Pt pleasant. Pt son is supportive and actively involved in pt care. Pt son has been pleased with [facility] since pt admission to ALF.   Patient/Family's Understanding of and Emotional Response to Diagnosis, Current Treatment, and Prognosis:  Pt son displayed knowledge about pt diagnosis and treatment plan. Pt son expressed that he is eager to find out further about pt testing which was completed in the ED yesterday evening.   Emotional Assessment Appearance:  Appears younger than stated age Attitude/Demeanor/Rapport:  Other (pt appropriate and pleasant) Affect (typically observed):  Pleasant, Appropriate Orientation:  Oriented to Self, Oriented to Place Alcohol / Substance use:  Not Applicable Psych involvement (Current and /or in the community):  No (Comment)  Discharge Needs  Concerns to be addressed:  Discharge Planning Concerns Readmission within the last 30 days:  Yes Current  discharge risk:  None Barriers to Discharge:  No Barriers Identified   KIDD, [place], LCSW 08/21/2015, 10:55 AM  [phone]

## 2015-08-21 NOTE — Progress Notes (Signed)
[facility] PROGRESS NOTE  [name] HUD:[id number] DOB: 1919-03-23 DOA: 08/20/2015 PCP: Gildardo Cranker, DO  Assessment/Plan: Principal Problem:   Acute diastolic heart failure Active Problems:   Bladder prolapse, female, acquired   Alzheimer's disease   Chronic kidney disease (CKD), stage IV (severe)   HCAP (healthcare-associated pneumonia)     1. Acute diastolic heart failure:  This appears to be the primary driver of the patient's hypoxia and shortness of breath. Her baseline ejection fraction was measured at the beginning of this month and was 65%. She has not been on a beta blocker or an ACE inhibitor, because she has diastolic heart failure. -Received Furosemide 40 mg IV daily , switch to by mouth -Follow BMP Strict I's and O's   2. Possible HCAP: Confirmed by CT scan without contrast  -Continue vancomycin and Zosyn, recent discharge to 9/10 for the same -Pro-calcitonin negative   3. Elevated troponin, suspected demand ischemia, type 2 NSTEMI: This low level elevation is suspected to be from heart failure. ACS is doubted. -Trend troponin -Continue home statin and aspirin   4. Hyperkalemia: Mild, and without EKG changes. -Furosemide as above -BMP trend  5. Chronic kidney disease stage 4-creatinine unchanged despite Lasix   5. Chronic bladder prolapse: The patient's son reports that she got no medical care between about the ages of 42 and 48. During that time she developed a severe bladder prolapse. If daily diuresis is to be a component of this patient's long-term management her bladder prolapse could be a significant obstacle. She's been seen and evaluated for a pessary in the past, and this was recommended, but she refused. -The patient's son requested if it was possible that the patient could be seen by urology in consultation while in the hospital to possibly get a pessary.     DVT prophylaxsis Lovenox  Code Status:      Code Status Orders         Start     Ordered   08/20/15 2040  Do not attempt resuscitation (DNR)   Continuous    Question Answer Comment  In the event of cardiac or respiratory ARREST Do not call a "code blue"   In the event of cardiac or respiratory ARREST Do not perform Intubation, CPR, defibrillation or ACLS   In the event of cardiac or respiratory ARREST Use medication by any route, position, wound care, and other measures to relive pain and suffering. May use oxygen, suction and manual treatment of airway obstruction as needed for comfort.      08/20/15 2039    Advance Directive Documentation        Most Recent Value   Type of Advance Directive  Living will, Healthcare Power of Attorney   Pre-existing out of facility DNR order (yellow form or pink MOST form)     "MOST" Form in Place?       Family Communication: family updated about patient's clinical progress Disposition Plan:  As above    Brief narrative: [name] is a 81 y.o. female with a past medical history significant for dementia, severe bladder prolapse, recent admission for pneumonia and CHF and CKD stage III who presents with dyspnea on exertion for a week.  The patient was admitted at the beginning of September for 4 days for pneumonia and acute diastolic CHF. She was discharged to a nursing home where she had been [place], until this week when her physical therapist noticed that she was more  breathless with exertion than normal, and even with standing for a prolonged period of time. That person checked SPO2 today and noticed that the patient was hypoxic in the 70s, and so the patient was sent to the ER. His just collected primarily from the patient's son as the patient has dementia.  Per the son, the patient has not had increased cough, increased purulent sputum, or fever. She just seems to be tired and breathless with any sort of exertion. She also notes that during her last hospitalization she really seemed to get better after she  was given Lasix.  Hospitalization she had an echocardiogram that showed an ejection fraction of 65%.  In the ED, the patient was started on vancomycin and Zosyn for HCAP after a CT of the chest showed a multifocal pneumonia versus fluid overload. She did not have an elevated white count, fever, or elevated lactic acid. She did have an elevated BNP, and minimally elevated troponin. The potassium was elevated.  Consultants:  None  Procedures:  None  Antibiotics: Anti-infectives    Start     Dose/Rate Route Frequency Ordered Stop   08/22/15 2000  vancomycin (VANCOCIN) IVPB 1000 mg/200 mL premix     1,000 mg 200 mL/hr over 60 Minutes Intravenous Every 48 hours 08/20/15 1931     08/21/15 0345  piperacillin-tazobactam (ZOSYN) IVPB 2.25 g     2.25 g 100 mL/hr over 30 Minutes Intravenous Every 8 hours 08/20/15 1931     08/20/15 1945  vancomycin (VANCOCIN) IVPB 1000 mg/200 mL premix     1,000 mg 200 mL/hr over 60 Minutes Intravenous NOW 08/20/15 1931 08/20/15 2141   08/20/15 1930  piperacillin-tazobactam (ZOSYN) IVPB 2.25 g     2.25 g 100 mL/hr over 30 Minutes Intravenous NOW 08/20/15 1931 08/20/15 2016         HPI/Subjective: Patient hemodynamically stable and breathing comfortably, afebrile overnight  Objective: Filed Vitals:   08/20/15 1714 08/20/15 2005 08/20/15 2038 08/21/15 0419  BP: 125/71 133/62 145/81 164/97  Pulse: 82 86 93 96  Temp:   97.2 F (36.2 C) 97.5 F (36.4 C)  TempSrc:   Oral Oral  Resp: 31 35 18 18  Weight:    61.5 kg (135 lb 9.3 oz)  SpO2: 98% 96% 94% 96%    Intake/Output Summary (Last 24 hours) at 08/21/15 1305 Last data filed at 08/21/15 0600  Gross per 24 hour  Intake    410 ml  Output      0 ml  Net    410 ml    Exam:  General: No acute respiratory distress Lungs: Clear to auscultation bilaterally without wheezes or crackles Cardiovascular: Regular rate and rhythm without murmur gallop or rub normal S1 and S2 Abdomen: Nontender,  nondistended, soft, bowel sounds positive, no rebound, no ascites, no appreciable mass Extremities: No significant cyanosis, clubbing, or edema bilateral lower extremities     Data Review   Micro Results Recent Results (from the past 240 hour(s))  MRSA PCR Screening     Status: None   Collection Time: 08/21/15  3:27 AM  Result Value Ref Range Status   MRSA by PCR NEGATIVE NEGATIVE Final    Comment:        The GeneXpert MRSA Assay (FDA approved for NASAL specimens only), is one component of a comprehensive MRSA colonization surveillance program. It is not intended to diagnose MRSA infection nor to guide or monitor treatment for MRSA infections.     Radiology Reports Dg Chest  2 View  08/20/2015   CLINICAL DATA:  81 year old with progressive generalized weakness over the past few weeks and recent diagnosis of pneumonia.  EXAM: CHEST  2 VIEW  COMPARISON:  08/04/2015 and earlier.  FINDINGS: AP semi-erect and lateral images were obtained. Suboptimal inspiration. Cardiac silhouette upper normal in size to mildly enlarged for technique and degree of inspiration, unchanged. Thoracic aorta mildly tortuous and atherosclerotic, unchanged. Airspace consolidation throughout both lungs, not significantly changed since the 08/04/2015 examination. No new pulmonary parenchymal abnormalities. Stable small bilateral pleural effusions. Exaggeration of the usual thoracic kyphosis.  IMPRESSION: Since the most recent chest x-ray 08/04/2015:  1. No significant change in the airspace consolidation throughout both lungs and the associated small bilateral pleural effusions. This likely reflects a combination of CHF and pneumonia. 2. No new abnormalities.   Electronically Signed   By: Evangeline Dakin M.D.   On: 08/20/2015 17:01   Dg Chest 2 View  08/04/2015   CLINICAL DATA:  Decreased oxygen saturation today.  EXAM: CHEST  2 VIEW  COMPARISON:  PA and lateral chest 10/17/2012.  FINDINGS: There is cardiomegaly and  extensive bilateral airspace disease with an appearance most compatible with pulmonary edema. Small bilateral pleural effusions are identified. No pneumothorax.  IMPRESSION: Cardiomegaly and bilateral airspace disease likely due to pulmonary edema rather than pneumonia.   Electronically Signed   By: Inge Rise M.D.   On: 08/04/2015 14:36   Ct Chest Wo Contrast  08/20/2015   CLINICAL DATA:  Hypoxia.  Recently diagnosed with pneumonia.  EXAM: CT CHEST WITHOUT CONTRAST  TECHNIQUE: Multidetector CT imaging of the chest was performed following the standard protocol without IV contrast.  COMPARISON:  Chest radiographs obtained earlier today.  FINDINGS: Small pericardial effusion with a maximum thickness of 8 mm. Small left pleural effusion and minimal right pleural effusion.  Patchy interstitial and alveolar opacities in both lower lobes, lingula, inferior left upper lobe, right middle lobe and minimally in the inferior right upper lobe. Cylindrical bronchiectasis in these areas, specially in the lower lobes. Diffuse peribronchial thickening. Mild bullous changes in the lateral aspect of the right middle lobe. No enlarged lymph nodes.  Diffuse low density of the blood relative to the arterial walls, corresponding to anemia on the patient's recent labs. Atheromatous arterial calcifications, including the coronary arteries.  Calcified granulomata in the included portion of the liver. Low-density of the included portion of the upper pole of the right kidney, relative to the left kidney. There was cortical thinning with chronic severe hydronephrosis on the right on the ultrasound dated 11/07/2012. Thoracic spine degenerative changes, including changes of DISH.  IMPRESSION: 1. Bilateral multilobar pneumonia and/or chronic changes due to bronchiectasis. 2. Chronic bronchitic changes and mild changes of COPD. 3. Atheromatous arterial calcifications, including the coronary arteries. 4. Anemia. 5. Chronic right  hydronephrosis with cortical thinning, most likely explaining low density of the included portion of the upper pole of the right kidney. 6. Small pericardial effusion, small left pleural effusion and minimal right pleural effusion.   Electronically Signed   By: Claudie Revering M.D.   On: 08/20/2015 19:05     CBC  Recent Labs Lab 08/20/15 1511  WBC 8.2  HGB 10.2*  HCT 32.2*  PLT 363  MCV 91.7  MCH 29.1  MCHC 31.7  RDW 15.0  LYMPHSABS 1.2  MONOABS 0.7  EOSABS 0.3  BASOSABS 0.0    Chemistries   Recent Labs Lab 08/20/15 1511 08/21/15 0525  NA 134* 137  K  5.3* 4.6  CL 105 105  CO2 24 25  GLUCOSE 95 90  BUN 45* 42*  CREATININE 2.57* 2.66*  CALCIUM 9.1 8.7*  AST 28  --   ALT 16  --   ALKPHOS 48  --   BILITOT 0.4  --    ------------------------------------------------------------------------------------------------------------------ estimated creatinine clearance is 10.6 mL/min (by C-G formula based on Cr of 2.66). ------------------------------------------------------------------------------------------------------------------ No results for input(s): HGBA1C in the last 72 hours. ------------------------------------------------------------------------------------------------------------------ No results for input(s): CHOL, HDL, LDLCALC, TRIG, CHOLHDL, LDLDIRECT in the last 72 hours. ------------------------------------------------------------------------------------------------------------------ No results for input(s): TSH, T4TOTAL, T3FREE, THYROIDAB in the last 72 hours.  Invalid input(s): FREET3 ------------------------------------------------------------------------------------------------------------------ No results for input(s): VITAMINB12, FOLATE, FERRITIN, TIBC, IRON, RETICCTPCT in the last 72 hours.  Coagulation profile No results for input(s): INR, PROTIME in the last 168 hours.  No results for input(s): DDIMER in the last 72 hours.  Cardiac  Enzymes  Recent Labs Lab 08/20/15 2306 08/21/15 0525 08/21/15 1104  TROPONINI 0.05* 0.06* 0.05*   ------------------------------------------------------------------------------------------------------------------ Invalid input(s): POCBNP   CBG: No results for input(s): GLUCAP in the last 168 hours.     Studies: Dg Chest 2 View  08/20/2015   CLINICAL DATA:  81 year old with progressive generalized weakness over the past few weeks and recent diagnosis of pneumonia.  EXAM: CHEST  2 VIEW  COMPARISON:  08/04/2015 and earlier.  FINDINGS: AP semi-erect and lateral images were obtained. Suboptimal inspiration. Cardiac silhouette upper normal in size to mildly enlarged for technique and degree of inspiration, unchanged. Thoracic aorta mildly tortuous and atherosclerotic, unchanged. Airspace consolidation throughout both lungs, not significantly changed since the 08/04/2015 examination. No new pulmonary parenchymal abnormalities. Stable small bilateral pleural effusions. Exaggeration of the usual thoracic kyphosis.  IMPRESSION: Since the most recent chest x-ray 08/04/2015:  1. No significant change in the airspace consolidation throughout both lungs and the associated small bilateral pleural effusions. This likely reflects a combination of CHF and pneumonia. 2. No new abnormalities.   Electronically Signed   By: Evangeline Dakin M.D.   On: 08/20/2015 17:01   Ct Chest Wo Contrast  08/20/2015   CLINICAL DATA:  Hypoxia.  Recently diagnosed with pneumonia.  EXAM: CT CHEST WITHOUT CONTRAST  TECHNIQUE: Multidetector CT imaging of the chest was performed following the standard protocol without IV contrast.  COMPARISON:  Chest radiographs obtained earlier today.  FINDINGS: Small pericardial effusion with a maximum thickness of 8 mm. Small left pleural effusion and minimal right pleural effusion.  Patchy interstitial and alveolar opacities in both lower lobes, lingula, inferior left upper lobe, right middle  lobe and minimally in the inferior right upper lobe. Cylindrical bronchiectasis in these areas, specially in the lower lobes. Diffuse peribronchial thickening. Mild bullous changes in the lateral aspect of the right middle lobe. No enlarged lymph nodes.  Diffuse low density of the blood relative to the arterial walls, corresponding to anemia on the patient's recent labs. Atheromatous arterial calcifications, including the coronary arteries.  Calcified granulomata in the included portion of the liver. Low-density of the included portion of the upper pole of the right kidney, relative to the left kidney. There was cortical thinning with chronic severe hydronephrosis on the right on the ultrasound dated 11/07/2012. Thoracic spine degenerative changes, including changes of DISH.  IMPRESSION: 1. Bilateral multilobar pneumonia and/or chronic changes due to bronchiectasis. 2. Chronic bronchitic changes and mild changes of COPD. 3. Atheromatous arterial calcifications, including the coronary arteries. 4. Anemia. 5. Chronic right hydronephrosis with cortical thinning, most likely explaining  low density of the included portion of the upper pole of the right kidney. 6. Small pericardial effusion, small left pleural effusion and minimal right pleural effusion.   Electronically Signed   By: Claudie Revering M.D.   On: 08/20/2015 19:05      No results found for: HGBA1C Lab Results  Component Value Date   LDLCALC 123* 05/08/2015   CREATININE 2.66* 08/21/2015       Scheduled Meds: . aspirin EC  81 mg Oral Daily  . calcium carbonate  1 tablet Oral Daily  . docusate sodium  100 mg Oral Daily  . enoxaparin (LOVENOX) injection  30 mg Subcutaneous Q24H  . feeding supplement (ENSURE ENLIVE)  237 mL Oral BID BM  . furosemide  40 mg Intravenous Daily  . piperacillin-tazobactam (ZOSYN)  IV  2.25 g Intravenous Q8H  . polyethylene glycol  17 g Oral Daily  . simvastatin  5 mg Oral QHS  . [START ON 08/22/2015] vancomycin   1,000 mg Intravenous Q48H   Continuous Infusions:   Principal Problem:   Acute diastolic heart failure Active Problems:   Bladder prolapse, female, acquired   Alzheimer's disease   Chronic kidney disease (CKD), stage IV (severe)   HCAP (healthcare-associated pneumonia)    Time spent: 64 minutes   [facility] Pager [phone]. If 7PM-7AM, please contact night-coverage at [email or web address], password [email or web address] 08/21/2015, 1:05 PM  LOS: 1 day

## 2015-08-22 LAB — COMPREHENSIVE METABOLIC PANEL
ALBUMIN: 2.4 g/dL — AB (ref 3.5–5.0)
ALT: 13 U/L — ABNORMAL LOW (ref 14–54)
ANION GAP: 8 (ref 5–15)
AST: 23 U/L (ref 15–41)
Alkaline Phosphatase: 40 U/L (ref 38–126)
BILIRUBIN TOTAL: 0.5 mg/dL (ref 0.3–1.2)
BUN: 40 mg/dL — AB (ref 6–20)
CHLORIDE: 101 mmol/L (ref 101–111)
CO2: 26 mmol/L (ref 22–32)
Calcium: 8.4 mg/dL — ABNORMAL LOW (ref 8.9–10.3)
Creatinine, Ser: 3.05 mg/dL — ABNORMAL HIGH (ref 0.44–1.00)
GFR calc Af Amer: 14 mL/min — ABNORMAL LOW (ref >60)
GFR calc non Af Amer: 12 mL/min — ABNORMAL LOW (ref >60)
GLUCOSE: 91 mg/dL (ref 65–99)
POTASSIUM: 4.4 mmol/L (ref 3.5–5.1)
SODIUM: 135 mmol/L (ref 135–145)
Total Protein: 6 g/dL — ABNORMAL LOW (ref 6.5–8.1)

## 2015-08-22 LAB — CBC
HEMATOCRIT: 29.2 % — AB (ref 36.0–46.0)
Hemoglobin: 9.2 g/dL — ABNORMAL LOW (ref 12.0–15.0)
MCH: 29.2 pg (ref 26.0–34.0)
MCHC: 31.5 g/dL (ref 30.0–36.0)
MCV: 92.7 fL (ref 78.0–100.0)
PLATELETS: 320 10*3/uL (ref 150–400)
RBC: 3.15 MIL/uL — ABNORMAL LOW (ref 3.87–5.11)
RDW: 15.1 % (ref 11.5–15.5)
WBC: 7.4 10*3/uL (ref 4.0–10.5)

## 2015-08-22 LAB — PROCALCITONIN

## 2015-08-22 NOTE — Progress Notes (Signed)
[facility] PROGRESS NOTE  [name] [name] DOB: Apr 25, 1919 DOA: 08/20/2015 PCP: Alyssa Cranker, DO  Assessment/Plan: Principal Problem:   Acute diastolic heart failure Active Problems:   Bladder prolapse, female, acquired   Alzheimer's disease   Chronic kidney disease (CKD), stage IV (severe)   HCAP (healthcare-associated pneumonia)     1. Acute diastolic heart failure:  This appears to be the primary driver of the patient's hypoxia and shortness of breath. Her baseline ejection fraction was measured at the beginning of this month and was 65%. She has not been on a beta blocker or an ACE inhibitor, because she has diastolic heart failure. -Received Furosemide 40 mg IV daily , switched to by mouth -Follow BMP Strict I's and O's - repeat CXR in 2 to 3 days.   2. Possible HCAP: Confirmed by CT scan without contrast  -Continue vancomycin and Zosyn, recent discharge to 9/10 for the same -Pro-calcitonin negative\ - on [place] oxygen to keep sats greater than 90%.     3. Elevated troponin, suspected demand ischemia, type 2 NSTEMI: This low level elevation is suspected to be from heart failure. ACS is doubted. -Trend troponin -Continue home statin and aspirin   4. Hyperkalemia: Mild, and without EKG changes. -Furosemide as above - resolved.   5. Chronic kidney disease stage 4-creatinine  Slightly worse than baseline. Check renal parameters in am.    5. Chronic bladder prolapse: The patient's son reports that she got no medical care between about the ages of 56 and 12. During that time she developed a severe bladder prolapse. If daily diuresis is to be a component of this patient's long-term management her bladder prolapse could be a significant obstacle. She's been seen and evaluated for a pessary in the past, and this was recommended, but she refused. -The patient's son requested if it was possible that the patient could be seen by urology in consultation  while in the hospital to possibly get a pessary, but the patient at bedside, vehemently refused to a pessary.   6. TOOTH pain: Patient's son requesting for a dental consult on Monday for evaluation of bad teeth.       DVT prophylaxsis Lovenox  Code Status:      Code Status Orders        Start     Ordered   08/20/15 2040  Do not attempt resuscitation (DNR)   Continuous    Question Answer Comment  In the event of cardiac or respiratory ARREST Do not call a "code blue"   In the event of cardiac or respiratory ARREST Do not perform Intubation, CPR, defibrillation or ACLS   In the event of cardiac or respiratory ARREST Use medication by any route, position, wound care, and other measures to relive pain and suffering. May use oxygen, suction and manual treatment of airway obstruction as needed for comfort.      08/20/15 2039    Advance Directive Documentation        Most Recent Value   Type of Advance Directive  Living will, Healthcare Power of Attorney   Pre-existing out of facility DNR order (yellow form or pink MOST form)     "MOST" Form in Place?       Family Communication: family updated about patient's clinical progress Disposition Plan:  As above    Brief narrative: [name] is a 81 y.o. female with a past medical history significant for dementia, severe bladder prolapse, recent admission  for pneumonia and CHF and CKD stage III who presents with dyspnea on exertion for a week.  She was found to have persistent pneumonia.  Consultants:  None  Procedures:  None  Antibiotics: Anti-infectives    Start     Dose/Rate Route Frequency Ordered Stop   08/22/15 2000  vancomycin (VANCOCIN) IVPB 1000 mg/200 mL premix     1,000 mg 200 mL/hr over 60 Minutes Intravenous Every 48 hours 08/20/15 1931     08/21/15 0345  piperacillin-tazobactam (ZOSYN) IVPB 2.25 g     2.25 g 100 mL/hr over 30 Minutes Intravenous Every 8 hours 08/20/15 1931     08/20/15 1945  vancomycin  (VANCOCIN) IVPB 1000 mg/200 mL premix     1,000 mg 200 mL/hr over 60 Minutes Intravenous NOW 08/20/15 1931 08/20/15 2141   08/20/15 1930  piperacillin-tazobactam (ZOSYN) IVPB 2.25 g     2.25 g 100 mL/hr over 30 Minutes Intravenous NOW 08/20/15 1931 08/20/15 2016         HPI/Subjective: Patient hemodynamically stable and breathing comfortably, afebrile overnight  Objective: Filed Vitals:   08/21/15 1405 08/21/15 2028 08/22/15 0451 08/22/15 0941  BP: 100/51 92/47 111/60   Pulse: 78 81 71   Temp: 98.2 F (36.8 C) 98.5 F (36.9 C) 98.2 F (36.8 C)   TempSrc: Oral Oral Oral   Resp: 18 18 18    Height:    5\' 2"  (1.575 m)  Weight:   61.2 kg (134 lb 14.7 oz) 60.5 kg (133 lb 6.1 oz)  SpO2: 99% 97% 99%    No intake or output data in the 24 hours ending 08/22/15 1551  Exam:  General: No acute respiratory distress Lungs: Clear to auscultation bilaterally without wheezes or crackles Cardiovascular: Regular rate and rhythm without murmur gallop or rub normal S1 and S2 Abdomen: Nontender, nondistended, soft, bowel sounds positive, no rebound, no ascites, no appreciable mass Extremities: No significant cyanosis, clubbing, or edema bilateral lower extremities     Data Review   Micro Results Recent Results (from the past 240 hour(s))  MRSA PCR Screening     Status: None   Collection Time: 08/21/15  3:27 AM  Result Value Ref Range Status   MRSA by PCR NEGATIVE NEGATIVE Final    Comment:        The GeneXpert MRSA Assay (FDA approved for NASAL specimens only), is one component of a comprehensive MRSA colonization surveillance program. It is not intended to diagnose MRSA infection nor to guide or monitor treatment for MRSA infections.     Radiology Reports Dg Chest 2 View  08/20/2015   CLINICAL DATA:  81 year old with progressive generalized weakness over the past few weeks and recent diagnosis of pneumonia.  EXAM: CHEST  2 VIEW  COMPARISON:  08/04/2015 and earlier.   FINDINGS: AP semi-erect and lateral images were obtained. Suboptimal inspiration. Cardiac silhouette upper normal in size to mildly enlarged for technique and degree of inspiration, unchanged. Thoracic aorta mildly tortuous and atherosclerotic, unchanged. Airspace consolidation throughout both lungs, not significantly changed since the 08/04/2015 examination. No new pulmonary parenchymal abnormalities. Stable small bilateral pleural effusions. Exaggeration of the usual thoracic kyphosis.  IMPRESSION: Since the most recent chest x-ray 08/04/2015:  1. No significant change in the airspace consolidation throughout both lungs and the associated small bilateral pleural effusions. This likely reflects a combination of CHF and pneumonia. 2. No new abnormalities.   Electronically Signed   By: Evangeline Dakin M.D.   On: 08/20/2015 17:01   Dg  Chest 2 View  08/04/2015   CLINICAL DATA:  Decreased oxygen saturation today.  EXAM: CHEST  2 VIEW  COMPARISON:  PA and lateral chest 10/17/2012.  FINDINGS: There is cardiomegaly and extensive bilateral airspace disease with an appearance most compatible with pulmonary edema. Small bilateral pleural effusions are identified. No pneumothorax.  IMPRESSION: Cardiomegaly and bilateral airspace disease likely due to pulmonary edema rather than pneumonia.   Electronically Signed   By: Inge Rise M.D.   On: 08/04/2015 14:36   Ct Chest Wo Contrast  08/20/2015   CLINICAL DATA:  Hypoxia.  Recently diagnosed with pneumonia.  EXAM: CT CHEST WITHOUT CONTRAST  TECHNIQUE: Multidetector CT imaging of the chest was performed following the standard protocol without IV contrast.  COMPARISON:  Chest radiographs obtained earlier today.  FINDINGS: Small pericardial effusion with a maximum thickness of 8 mm. Small left pleural effusion and minimal right pleural effusion.  Patchy interstitial and alveolar opacities in both lower lobes, lingula, inferior left upper lobe, right middle lobe and  minimally in the inferior right upper lobe. Cylindrical bronchiectasis in these areas, specially in the lower lobes. Diffuse peribronchial thickening. Mild bullous changes in the lateral aspect of the right middle lobe. No enlarged lymph nodes.  Diffuse low density of the blood relative to the arterial walls, corresponding to anemia on the patient's recent labs. Atheromatous arterial calcifications, including the coronary arteries.  Calcified granulomata in the included portion of the liver. Low-density of the included portion of the upper pole of the right kidney, relative to the left kidney. There was cortical thinning with chronic severe hydronephrosis on the right on the ultrasound dated 11/07/2012. Thoracic spine degenerative changes, including changes of DISH.  IMPRESSION: 1. Bilateral multilobar pneumonia and/or chronic changes due to bronchiectasis. 2. Chronic bronchitic changes and mild changes of COPD. 3. Atheromatous arterial calcifications, including the coronary arteries. 4. Anemia. 5. Chronic right hydronephrosis with cortical thinning, most likely explaining low density of the included portion of the upper pole of the right kidney. 6. Small pericardial effusion, small left pleural effusion and minimal right pleural effusion.   Electronically Signed   By: Claudie Revering M.D.   On: 08/20/2015 19:05     CBC  Recent Labs Lab 08/20/15 1511 08/22/15 0509  WBC 8.2 7.4  HGB 10.2* 9.2*  HCT 32.2* 29.2*  PLT 363 320  MCV 91.7 92.7  MCH 29.1 29.2  MCHC 31.7 31.5  RDW 15.0 15.1  LYMPHSABS 1.2  --   MONOABS 0.7  --   EOSABS 0.3  --   BASOSABS 0.0  --     Chemistries   Recent Labs Lab 08/20/15 1511 08/21/15 0525 08/22/15 0509  NA 134* 137 135  K 5.3* 4.6 4.4  CL 105 105 101  CO2 24 25 26   GLUCOSE 95 90 91  BUN 45* 42* 40*  CREATININE 2.57* 2.66* 3.05*  CALCIUM 9.1 8.7* 8.4*  AST 28  --  23  ALT 16  --  13*  ALKPHOS 48  --  40  BILITOT 0.4  --  0.5    ------------------------------------------------------------------------------------------------------------------ estimated creatinine clearance is 9.7 mL/min (by C-G formula based on Cr of 3.05). ------------------------------------------------------------------------------------------------------------------ No results for input(s): HGBA1C in the last 72 hours. ------------------------------------------------------------------------------------------------------------------ No results for input(s): CHOL, HDL, LDLCALC, TRIG, CHOLHDL, LDLDIRECT in the last 72 hours. ------------------------------------------------------------------------------------------------------------------ No results for input(s): TSH, T4TOTAL, T3FREE, THYROIDAB in the last 72 hours.  Invalid input(s): FREET3 ------------------------------------------------------------------------------------------------------------------ No results for input(s): VITAMINB12, FOLATE, FERRITIN, TIBC, IRON,  RETICCTPCT in the last 72 hours.  Coagulation profile No results for input(s): INR, PROTIME in the last 168 hours.  No results for input(s): DDIMER in the last 72 hours.  Cardiac Enzymes  Recent Labs Lab 08/20/15 2306 08/21/15 0525 08/21/15 1104  TROPONINI 0.05* 0.06* 0.05*   ------------------------------------------------------------------------------------------------------------------ Invalid input(s): POCBNP   CBG: No results for input(s): GLUCAP in the last 168 hours.     Studies: Dg Chest 2 View  08/20/2015   CLINICAL DATA:  81 year old with progressive generalized weakness over the past few weeks and recent diagnosis of pneumonia.  EXAM: CHEST  2 VIEW  COMPARISON:  08/04/2015 and earlier.  FINDINGS: AP semi-erect and lateral images were obtained. Suboptimal inspiration. Cardiac silhouette upper normal in size to mildly enlarged for technique and degree of inspiration, unchanged. Thoracic aorta mildly tortuous  and atherosclerotic, unchanged. Airspace consolidation throughout both lungs, not significantly changed since the 08/04/2015 examination. No new pulmonary parenchymal abnormalities. Stable small bilateral pleural effusions. Exaggeration of the usual thoracic kyphosis.  IMPRESSION: Since the most recent chest x-ray 08/04/2015:  1. No significant change in the airspace consolidation throughout both lungs and the associated small bilateral pleural effusions. This likely reflects a combination of CHF and pneumonia. 2. No new abnormalities.   Electronically Signed   By: Evangeline Dakin M.D.   On: 08/20/2015 17:01   Ct Chest Wo Contrast  08/20/2015   CLINICAL DATA:  Hypoxia.  Recently diagnosed with pneumonia.  EXAM: CT CHEST WITHOUT CONTRAST  TECHNIQUE: Multidetector CT imaging of the chest was performed following the standard protocol without IV contrast.  COMPARISON:  Chest radiographs obtained earlier today.  FINDINGS: Small pericardial effusion with a maximum thickness of 8 mm. Small left pleural effusion and minimal right pleural effusion.  Patchy interstitial and alveolar opacities in both lower lobes, lingula, inferior left upper lobe, right middle lobe and minimally in the inferior right upper lobe. Cylindrical bronchiectasis in these areas, specially in the lower lobes. Diffuse peribronchial thickening. Mild bullous changes in the lateral aspect of the right middle lobe. No enlarged lymph nodes.  Diffuse low density of the blood relative to the arterial walls, corresponding to anemia on the patient's recent labs. Atheromatous arterial calcifications, including the coronary arteries.  Calcified granulomata in the included portion of the liver. Low-density of the included portion of the upper pole of the right kidney, relative to the left kidney. There was cortical thinning with chronic severe hydronephrosis on the right on the ultrasound dated 11/07/2012. Thoracic spine degenerative changes, including changes  of DISH.  IMPRESSION: 1. Bilateral multilobar pneumonia and/or chronic changes due to bronchiectasis. 2. Chronic bronchitic changes and mild changes of COPD. 3. Atheromatous arterial calcifications, including the coronary arteries. 4. Anemia. 5. Chronic right hydronephrosis with cortical thinning, most likely explaining low density of the included portion of the upper pole of the right kidney. 6. Small pericardial effusion, small left pleural effusion and minimal right pleural effusion.   Electronically Signed   By: Claudie Revering M.D.   On: 08/20/2015 19:05      No results found for: HGBA1C Lab Results  Component Value Date   LDLCALC 123* 05/08/2015   CREATININE 3.05* 08/22/2015       Scheduled Meds: . antiseptic oral rinse  7 mL Mouth Rinse q12n4p  . aspirin EC  81 mg Oral Daily  . calcium carbonate  1 tablet Oral Daily  . chlorhexidine  15 mL Mouth Rinse BID  . docusate sodium  100 mg Oral  Daily  . enoxaparin (LOVENOX) injection  30 mg Subcutaneous Q24H  . feeding supplement (ENSURE ENLIVE)  237 mL Oral BID BM  . furosemide  40 mg Oral Daily  . piperacillin-tazobactam (ZOSYN)  IV  2.25 g Intravenous Q8H  . polyethylene glycol  17 g Oral Daily  . simvastatin  5 mg Oral QHS  . vancomycin  1,000 mg Intravenous Q48H   Continuous Infusions:   Principal Problem:   Acute diastolic heart failure Active Problems:   Bladder prolapse, female, acquired   Alzheimer's disease   Chronic kidney disease (CKD), stage IV (severe)   HCAP (healthcare-associated pneumonia)    Time spent: 30 minutes   [facility] Pager [phone]. If 7PM-7AM, please contact night-coverage at [email or web address], password [facility] 08/22/2015, 3:51 PM  LOS: 2 days

## 2015-08-23 ENCOUNTER — Inpatient Hospital Stay (HOSPITAL_COMMUNITY): Payer: Medicare Other

## 2015-08-23 DIAGNOSIS — N179 Acute kidney failure, unspecified: Secondary | ICD-10-CM

## 2015-08-23 LAB — BASIC METABOLIC PANEL
ANION GAP: 9 (ref 5–15)
BUN: 39 mg/dL — ABNORMAL HIGH (ref 6–20)
CHLORIDE: 99 mmol/L — AB (ref 101–111)
CO2: 26 mmol/L (ref 22–32)
Calcium: 8.6 mg/dL — ABNORMAL LOW (ref 8.9–10.3)
Creatinine, Ser: 3.32 mg/dL — ABNORMAL HIGH (ref 0.44–1.00)
GFR calc non Af Amer: 11 mL/min — ABNORMAL LOW (ref >60)
GFR, EST AFRICAN AMERICAN: 13 mL/min — AB (ref >60)
GLUCOSE: 93 mg/dL (ref 65–99)
Potassium: 4.1 mmol/L (ref 3.5–5.1)
Sodium: 134 mmol/L — ABNORMAL LOW (ref 135–145)

## 2015-08-23 LAB — TROPONIN I: TROPONIN I: 0.05 ng/mL — AB (ref <0.031)

## 2015-08-23 MED ORDER — LEVOFLOXACIN 500 MG PO TABS
500.0000 mg | ORAL_TABLET | ORAL | Status: DC
  Administered 2015-08-23 – 2015-08-25 (×2): 500 mg via ORAL
  Filled 2015-08-23 (×2): qty 1

## 2015-08-23 MED ORDER — LORAZEPAM 2 MG/ML IJ SOLN
0.5000 mg | Freq: Once | INTRAMUSCULAR | Status: AC
  Administered 2015-08-23: 0.5 mg via INTRAVENOUS
  Filled 2015-08-23: qty 1

## 2015-08-23 MED ORDER — SODIUM CHLORIDE 0.9 % IV SOLN
INTRAVENOUS | Status: AC
  Administered 2015-08-23: 12:00:00 via INTRAVENOUS

## 2015-08-23 NOTE — Progress Notes (Signed)
Patient with two episodes of loose stool today per NT report. MD paged with this information. RN did not adminster today's dose of miralax. Will continue to monitor.

## 2015-08-23 NOTE — Progress Notes (Addendum)
[facility] PROGRESS NOTE  [name] LEX:[id number] DOB: [birth year] DOA: 08/20/2015 PCP: Gildardo Cranker, DO  Assessment/Plan: Principal Problem:   Acute diastolic heart failure Active Problems:   Bladder prolapse, female, acquired   Alzheimer's disease   Chronic kidney disease (CKD), stage IV (severe)   HCAP (healthcare-associated pneumonia)     1. Acute diastolic heart failure:  This appears to be the primary driver of the patient's hypoxia and shortness of breath. Her baseline ejection fraction was measured at the beginning of this month and was 65%. She has not been on a beta blocker or an ACE inhibitor, because she has diastolic heart failure. -Received Furosemide 40 mg IV daily , discontinue Lasix given increasing creatinine -Follow BMP Strict I's and O's    2. Possible HCAP: Confirmed by CT scan without contrast  -Continue vancomycin and Zosyn, day #3,-Pro-calcitonin negative, will switch to PO Levaquin and repeat chest x-ray today. Oxygen requirements stable around 2 L    3. Elevated troponin, suspected demand ischemia, type 2 NSTEMI: This low level elevation is suspected to be from heart failure. ACS is doubted. Troponin flat-Continue home statin and aspirin   4. Hyperkalemia: Follow  5. Acute on chronic   kidney disease stage 4-creatinine trending up, baseline 2.6  . Now through 3.32.We will DC Lasix  5. Chronic bladder prolapse: The patient's son reports that she got no medical care between about the ages of 36 and 39. During that time she developed a severe bladder prolapse.  She's been seen and evaluated for a pessary in the past, and this was recommended, but she refused. -The patient's son requested if it was possible that the patient could be seen by urology in consultation while in the hospital to possibly get a pessary, but the patient at bedside, vehemently refused to a pessary.   6. TOOTH pain: Could not obtain dental x-rays as these not  available per radiology dept  , patient would need outpatient dental follow-up. Without x-ray and equipment not able to offer much help in terms of treating dental problems  7. Diarrhea: if patient has a 3 rd stool will have a low threshold to check for c diff, DC miralax and colace     DVT prophylaxsis Lovenox  Code Status:      Code Status Orders        Start     Ordered   08/20/15 2040  Do not attempt resuscitation (DNR)   Continuous    Question Answer Comment  In the event of cardiac or respiratory ARREST Do not call a "code blue"   In the event of cardiac or respiratory ARREST Do not perform Intubation, CPR, defibrillation or ACLS   In the event of cardiac or respiratory ARREST Use medication by any route, position, wound care, and other measures to relive pain and suffering. May use oxygen, suction and manual treatment of airway obstruction as needed for comfort.      08/20/15 2039    Advance Directive Documentation        Most Recent Value   Type of Advance Directive  Living will, Healthcare Power of Attorney   Pre-existing out of facility DNR order (yellow form or pink MOST form)     "MOST" Form in Place?       Family Communication:son by the bedside  Disposition Plan: 2-3 days depending on renal function     Brief narrative: [name] is a 81 y.o. female with  a past medical history significant for dementia, severe bladder prolapse, recent admission for pneumonia and CHF and CKD stage III who presents with dyspnea on exertion for a week.  She was found to have persistent pneumonia. Started on vancomycin and Zosyn, Pro calcitonin negative, switched to by mouth Levaquin for another 5 days, renally dosed, creatinine worsening therefore not stable for discharge at this time.  Consultants:  None  Procedures:  None  Antibiotics: Anti-infectives    Start     Dose/Rate Route Frequency Ordered Stop   08/22/15 2000  vancomycin (VANCOCIN) IVPB 1000 mg/200 mL premix      1,000 mg 200 mL/hr over 60 Minutes Intravenous Every 48 hours 08/20/15 1931     08/21/15 0345  piperacillin-tazobactam (ZOSYN) IVPB 2.25 g     2.25 g 100 mL/hr over 30 Minutes Intravenous Every 8 hours 08/20/15 1931     08/20/15 1945  vancomycin (VANCOCIN) IVPB 1000 mg/200 mL premix     1,000 mg 200 mL/hr over 60 Minutes Intravenous NOW 08/20/15 1931 08/20/15 2141   08/20/15 1930  piperacillin-tazobactam (ZOSYN) IVPB 2.25 g     2.25 g 100 mL/hr over 30 Minutes Intravenous NOW 08/20/15 1931 08/20/15 2016         HPI/Subjective: Patient hemodynamically stable and breathing comfortably, afebrile overnight  Objective: Filed Vitals:   08/22/15 0941 08/22/15 1700 08/22/15 2051 08/23/15 0617  BP:  133/60 110/52 100/55  Pulse:  70 67 66  Temp:  98.2 F (36.8 C) 98.7 F (37.1 C) 98.2 F (36.8 C)  TempSrc:  Oral Oral Oral  Resp:  18 18   Height: 5\' 2"  (1.575 m)     Weight: 60.5 kg (133 lb 6.1 oz)   60.6 kg (133 lb 9.6 oz)  SpO2:  98% 100% 97%    Intake/Output Summary (Last 24 hours) at 08/23/15 1107 Last data filed at 08/23/15 0500  Gross per 24 hour  Intake  717.9 ml  Output      0 ml  Net  717.9 ml    Exam:  General: No acute respiratory distress Lungs: Clear to auscultation bilaterally without wheezes or crackles Cardiovascular: Regular rate and rhythm without murmur gallop or rub normal S1 and S2 Abdomen: Nontender, nondistended, soft, bowel sounds positive, no rebound, no ascites, no appreciable mass Extremities: No significant cyanosis, clubbing, or edema bilateral lower extremities     Data Review   Micro Results Recent Results (from the past 240 hour(s))  MRSA PCR Screening     Status: None   Collection Time: 08/21/15  3:27 AM  Result Value Ref Range Status   MRSA by PCR NEGATIVE NEGATIVE Final    Comment:        The GeneXpert MRSA Assay (FDA approved for NASAL specimens only), is one component of a comprehensive MRSA colonization surveillance  program. It is not intended to diagnose MRSA infection nor to guide or monitor treatment for MRSA infections.     Radiology Reports Dg Chest 2 View  08/20/2015   CLINICAL DATA:  81 year old with progressive generalized weakness over the past few weeks and recent diagnosis of pneumonia.  EXAM: CHEST  2 VIEW  COMPARISON:  08/04/2015 and earlier.  FINDINGS: AP semi-erect and lateral images were obtained. Suboptimal inspiration. Cardiac silhouette upper normal in size to mildly enlarged for technique and degree of inspiration, unchanged. Thoracic aorta mildly tortuous and atherosclerotic, unchanged. Airspace consolidation throughout both lungs, not significantly changed since the 08/04/2015 examination. No new pulmonary parenchymal abnormalities. Stable  small bilateral pleural effusions. Exaggeration of the usual thoracic kyphosis.  IMPRESSION: Since the most recent chest x-ray 08/04/2015:  1. No significant change in the airspace consolidation throughout both lungs and the associated small bilateral pleural effusions. This likely reflects a combination of CHF and pneumonia. 2. No new abnormalities.   Electronically Signed   By: Evangeline Dakin M.D.   On: 08/20/2015 17:01   Dg Chest 2 View  08/04/2015   CLINICAL DATA:  Decreased oxygen saturation today.  EXAM: CHEST  2 VIEW  COMPARISON:  PA and lateral chest 10/17/2012.  FINDINGS: There is cardiomegaly and extensive bilateral airspace disease with an appearance most compatible with pulmonary edema. Small bilateral pleural effusions are identified. No pneumothorax.  IMPRESSION: Cardiomegaly and bilateral airspace disease likely due to pulmonary edema rather than pneumonia.   Electronically Signed   By: Inge Rise M.D.   On: 08/04/2015 14:36   Ct Chest Wo Contrast  08/20/2015   CLINICAL DATA:  Hypoxia.  Recently diagnosed with pneumonia.  EXAM: CT CHEST WITHOUT CONTRAST  TECHNIQUE: Multidetector CT imaging of the chest was performed following the  standard protocol without IV contrast.  COMPARISON:  Chest radiographs obtained earlier today.  FINDINGS: Small pericardial effusion with a maximum thickness of 8 mm. Small left pleural effusion and minimal right pleural effusion.  Patchy interstitial and alveolar opacities in both lower lobes, lingula, inferior left upper lobe, right middle lobe and minimally in the inferior right upper lobe. Cylindrical bronchiectasis in these areas, specially in the lower lobes. Diffuse peribronchial thickening. Mild bullous changes in the lateral aspect of the right middle lobe. No enlarged lymph nodes.  Diffuse low density of the blood relative to the arterial walls, corresponding to anemia on the patient's recent labs. Atheromatous arterial calcifications, including the coronary arteries.  Calcified granulomata in the included portion of the liver. Low-density of the included portion of the upper pole of the right kidney, relative to the left kidney. There was cortical thinning with chronic severe hydronephrosis on the right on the ultrasound dated 11/07/2012. Thoracic spine degenerative changes, including changes of DISH.  IMPRESSION: 1. Bilateral multilobar pneumonia and/or chronic changes due to bronchiectasis. 2. Chronic bronchitic changes and mild changes of COPD. 3. Atheromatous arterial calcifications, including the coronary arteries. 4. Anemia. 5. Chronic right hydronephrosis with cortical thinning, most likely explaining low density of the included portion of the upper pole of the right kidney. 6. Small pericardial effusion, small left pleural effusion and minimal right pleural effusion.   Electronically Signed   By: Claudie Revering M.D.   On: 08/20/2015 19:05     CBC  Recent Labs Lab 08/20/15 1511 08/22/15 0509  WBC 8.2 7.4  HGB 10.2* 9.2*  HCT 32.2* 29.2*  PLT 363 320  MCV 91.7 92.7  MCH 29.1 29.2  MCHC 31.7 31.5  RDW 15.0 15.1  LYMPHSABS 1.2  --   MONOABS 0.7  --   EOSABS 0.3  --   BASOSABS 0.0  --      Chemistries   Recent Labs Lab 08/20/15 1511 08/21/15 0525 08/22/15 0509 08/23/15 0419  NA 134* 137 135 134*  K 5.3* 4.6 4.4 4.1  CL 105 105 101 99*  CO2 24 25 26 26   GLUCOSE 95 90 91 93  BUN 45* 42* 40* 39*  CREATININE 2.57* 2.66* 3.05* 3.32*  CALCIUM 9.1 8.7* 8.4* 8.6*  AST 28  --  23  --   ALT 16  --  13*  --  ALKPHOS 48  --  40  --   BILITOT 0.4  --  0.5  --    ------------------------------------------------------------------------------------------------------------------ estimated creatinine clearance is 8.9 mL/min (by C-G formula based on Cr of 3.32). ------------------------------------------------------------------------------------------------------------------ No results for input(s): HGBA1C in the last 72 hours. ------------------------------------------------------------------------------------------------------------------ No results for input(s): CHOL, HDL, LDLCALC, TRIG, CHOLHDL, LDLDIRECT in the last 72 hours. ------------------------------------------------------------------------------------------------------------------ No results for input(s): TSH, T4TOTAL, T3FREE, THYROIDAB in the last 72 hours.  Invalid input(s): FREET3 ------------------------------------------------------------------------------------------------------------------ No results for input(s): VITAMINB12, FOLATE, FERRITIN, TIBC, IRON, RETICCTPCT in the last 72 hours.  Coagulation profile No results for input(s): INR, PROTIME in the last 168 hours.  No results for input(s): DDIMER in the last 72 hours.  Cardiac Enzymes  Recent Labs Lab 08/20/15 2306 08/21/15 0525 08/21/15 1104  TROPONINI 0.05* 0.06* 0.05*   ------------------------------------------------------------------------------------------------------------------ Invalid input(s): POCBNP   CBG: No results for input(s): GLUCAP in the last 168 hours.     Studies: No results found.    No results found for:  HGBA1C Lab Results  Component Value Date   LDLCALC 123* 05/08/2015   CREATININE 3.32* 08/23/2015       Scheduled Meds: . antiseptic oral rinse  7 mL Mouth Rinse q12n4p  . aspirin EC  81 mg Oral Daily  . calcium carbonate  1 tablet Oral Daily  . chlorhexidine  15 mL Mouth Rinse BID  . docusate sodium  100 mg Oral Daily  . enoxaparin (LOVENOX) injection  30 mg Subcutaneous Q24H  . feeding supplement (ENSURE ENLIVE)  237 mL Oral BID BM  . furosemide  40 mg Oral Daily  . piperacillin-tazobactam (ZOSYN)  IV  2.25 g Intravenous Q8H  . polyethylene glycol  17 g Oral Daily  . simvastatin  5 mg Oral QHS  . vancomycin  1,000 mg Intravenous Q48H   Continuous Infusions:   Principal Problem:   Acute diastolic heart failure Active Problems:   Bladder prolapse, female, acquired   Alzheimer's disease   Chronic kidney disease (CKD), stage IV (severe)   HCAP (healthcare-associated pneumonia)    Time spent: 32 minutes   [facility] Pager [phone]. If 7PM-7AM, please contact night-coverage at [email or web address], password [facility] 08/23/2015, 11:07 AM  LOS: 3 days

## 2015-08-23 NOTE — Progress Notes (Signed)
RN called into room by patients son stating patient was eating dinner when she started to complain of a racing heartbeat and numbness in her left hand. RN came in and found patient distressed speaking about heart racing stating " I am having a heart attack", "I am dying" and " this is the end".  Pt speech clear, able to move both arms, VS R18 P79 BP 134/69 O2 98% on 2L.  RN auscultated heart, rate regular with no adventitious sounds.  RN asked son regarding any such episodes in the past or whether the patient has had episodes of anxiety.  Son states not to his knowledge.  MD notified.  EKG obtained, troponin levels ordered and patient to be transferred to telemetry for closer monitoring.  In addition, one time dose of ativan given to pt to help ease pt's anxious state. Will continue to monitor and transfer pt once bed available.

## 2015-08-23 NOTE — Progress Notes (Addendum)
ANTIBIOTIC CONSULT NOTE - Follow Up  Pharmacy Consult for Vancomycin / Zosyn Indication: HCAP  No Known Allergies  Patient Measurements: Height: 5\' 2"  (157.5 cm) Weight: 133 lb 9.6 oz (60.6 kg) IBW/kg (Calculated) : 50.1  Vital Signs: Temp: 98.2 F (36.8 C) (09/25 0617) Temp Source: Oral (09/25 0617) BP: 100/55 mmHg (09/25 0617) Pulse Rate: 66 (09/25 0617) Intake/Output from previous day: 09/24 0701 - 09/25 0700 In: 957.9 [P.O.:480; IV Piggyback:477.9] Out: -  Intake/Output from this shift:    Labs:  Recent Labs  08/20/15 1511 08/21/15 0525 08/22/15 0509 08/23/15 0419  WBC 8.2  --  7.4  --   HGB 10.2*  --  9.2*  --   PLT 363  --  320  --   CREATININE 2.57* 2.66* 3.05* 3.32*   Estimated Creatinine Clearance: 8.9 mL/min (by C-G formula based on Cr of 3.32). No results for input(s): VANCOTROUGH, VANCOPEAK, VANCORANDOM, GENTTROUGH, GENTPEAK, GENTRANDOM, TOBRATROUGH, TOBRAPEAK, TOBRARND, AMIKACINPEAK, AMIKACINTROU, AMIKACIN in the last 72 hours.   Microbiology: Recent Results (from the past 720 hour(s))  Culture, blood (routine x 2)     Status: None   Collection Time: 08/04/15 12:55 PM  Result Value Ref Range Status   Specimen Description BLOOD RIGHT ANTECUBITAL  Final   Special Requests BOTTLES DRAWN AEROBIC AND ANAEROBIC 10ML  Final   Culture   Final    NO GROWTH 5 DAYS Performed at [facility]    Report Status 08/09/2015 FINAL  Final  Culture, blood (routine x 2)     Status: None   Collection Time: 08/04/15  1:26 PM  Result Value Ref Range Status   Specimen Description BLOOD LEFT ANTECUBITAL  Final   Special Requests BOTTLES DRAWN AEROBIC AND ANAEROBIC 5ML  Final   Culture   Final    NO GROWTH 5 DAYS Performed at [facility]    Report Status 08/09/2015 FINAL  Final  MRSA PCR Screening     Status: None   Collection Time: 08/21/15  3:27 AM  Result Value Ref Range Status   MRSA by PCR NEGATIVE NEGATIVE Final    Comment:        The  GeneXpert MRSA Assay (FDA approved for NASAL specimens only), is one component of a comprehensive MRSA colonization surveillance program. It is not intended to diagnose MRSA infection nor to guide or monitor treatment for MRSA infections.     Medical History: Past Medical History  Diagnosis Date  . Prolapsed bladder   . Rectal prolapse   . Hyperlipidemia   . Chronic kidney disease (CKD), stage III (moderate) 06/27/2012  . Bladder prolapse, female, acquired 06/27/2012  . Prolapsed internal hemorrhoids 06/27/2012  . Unspecified vitamin D deficiency   . Alzheimer's disease   . Anemia   . Hydronephrosis   . Personal history of fall   . Rectal polyp, excised 11/22/2012 01/01/2013   Assessment: 31 yoF with recent hospitalization for acute respiratory failure secondary to CAP presents 9/22 from SNF with oxygen saturation in 70's on RA.  CXR shows bilateral multilobar PNA. Pharmacy consulted to start Vancomycin and Zosyn for HCAP.    Anti-infectives 9/22 >> Vancomycin  >> 9/22 >> Zosyn  >>    Vitals/Labs WBC: WNL Tm24h: afebrile Renal: AoCKD stage 4 - SCr rising, CrCl 9 ml/min  Cultures None   Dose changes/levels: 9/26 VT at 20:00 = ____ before 3rd dose/day 5 therapy (SCr rising)   Goal of Therapy:  Vancomycin trough level 15-20 mcg/ml  Zosyn dosing  appropriate for indication and renal function Eradication of infection  Plan:   Continue Zosyn 2.25g IV q8h  Continue Vancomycin 1g IV q48h  Trough ordered for 9/26 at 20:00 prior to 3rd dose (held dose on MAR until result assessed due to rising SCr)  Peggyann Juba, PharmD, BCPS 08/23/2015, 9:14 AM  Pager: [phone]   Addendum: MD now narrowing abx to Levaquin, pharmacy to dose Based on CrCl ~10 ml/min, begin 500mg  PO q48h Will continue to follow renal function and adjust as needed  Peggyann Juba, PharmD, BCPS 08/23/2015 11:32 AM

## 2015-08-23 NOTE — Progress Notes (Signed)
Report called and patient transferred to telemetry.

## 2015-08-24 DIAGNOSIS — N179 Acute kidney failure, unspecified: Secondary | ICD-10-CM | POA: Diagnosis present

## 2015-08-24 LAB — COMPREHENSIVE METABOLIC PANEL
ALT: 12 U/L — ABNORMAL LOW (ref 14–54)
ANION GAP: 8 (ref 5–15)
AST: 22 U/L (ref 15–41)
Albumin: 2.2 g/dL — ABNORMAL LOW (ref 3.5–5.0)
Alkaline Phosphatase: 35 U/L — ABNORMAL LOW (ref 38–126)
BUN: 37 mg/dL — ABNORMAL HIGH (ref 6–20)
CHLORIDE: 100 mmol/L — AB (ref 101–111)
CO2: 26 mmol/L (ref 22–32)
Calcium: 8.2 mg/dL — ABNORMAL LOW (ref 8.9–10.3)
Creatinine, Ser: 2.98 mg/dL — ABNORMAL HIGH (ref 0.44–1.00)
GFR, EST AFRICAN AMERICAN: 14 mL/min — AB (ref >60)
GFR, EST NON AFRICAN AMERICAN: 12 mL/min — AB (ref >60)
Glucose, Bld: 89 mg/dL (ref 65–99)
Potassium: 3.9 mmol/L (ref 3.5–5.1)
SODIUM: 134 mmol/L — AB (ref 135–145)
Total Bilirubin: 0.3 mg/dL (ref 0.3–1.2)
Total Protein: 5.8 g/dL — ABNORMAL LOW (ref 6.5–8.1)

## 2015-08-24 LAB — PROCALCITONIN

## 2015-08-24 LAB — TROPONIN I: TROPONIN I: 0.05 ng/mL — AB (ref <0.031)

## 2015-08-24 MED ORDER — INFLUENZA VAC SPLIT QUAD 0.5 ML IM SUSY
0.5000 mL | PREFILLED_SYRINGE | INTRAMUSCULAR | Status: AC
  Administered 2015-08-25: 0.5 mL via INTRAMUSCULAR
  Filled 2015-08-24 (×2): qty 0.5

## 2015-08-24 MED ORDER — ALUM & MAG HYDROXIDE-SIMETH 200-200-20 MG/5ML PO SUSP
30.0000 mL | Freq: Four times a day (QID) | ORAL | Status: DC | PRN
  Administered 2015-08-24 – 2015-08-25 (×3): 30 mL via ORAL
  Filled 2015-08-24 (×3): qty 30

## 2015-08-24 NOTE — Progress Notes (Signed)
PROGRESS NOTE  [name] HYQ:[id number] DOB: 06-13-1919 DOA: 08/20/2015 PCP: Gildardo Cranker, DO  HPI/Recap of past 22 hours: 81 year old female with past medical history of mild dementia, severe bladder prolapse, stage IV chronic kidney disease and recent admission for pneumonia and CHF came back to the hospital on 9/22 with dyspnea on exertion 1 week and admitted acute diastolic heart failure and healthcare associated pneumonia.  After several days of diuresis and antibiotics, patient significantly improving.  Her course. Became by acute kidney injury in the setting of chronic kidney disease secondary diuresis. Patient still has some problems with acid reflux and nausea/vomiting. Otherwise no complaints.  Assessment/Plan: Principal Problem:   Acute diastolic heart failure: Diuresed. Lasix discontinued following acute kidney injury Active Problems:   Bladder prolapse, female, acquired: Ongoing for some time. Pretty severe. Although patient is against pessary, have convinced her to at least listen to the urologist. Burnis Medin ask urology for evaluation and intervention if felt to be applicable   Alzheimer's disease: Stable    HCAP (healthcare-associated pneumonia): Now on by mouth Levaquin. Seen by speech therapy and continued on dysphagia 3 diet with thin liquids   AKI (acute kidney injury) in the setting of stage IV chronic kidney disease: Improved from previous day. Recheck tomorrow, should be close to baseline soon   Code Status: DO NOT RESUSCITATE  Family Communication: Son at the bedside  Disposition Plan: Depending on urology consultation   Consultants:  Urology  Procedures:  None  Antibiotics:  IV vancomycin 9/22-9/25  IV Zosyn 9/23-9/25  Levaquin 9/25-present   Objective: BP 122/57 mmHg  Pulse 75  Temp(Src) 98.1 F (36.7 C) (Oral)  Resp 20  Ht 5\' 2"  (1.575 m)  Wt 61.6 kg (135 lb 12.9 oz)  BMI 24.83 kg/m2  SpO2 96%  Intake/Output Summary (Last 24 hours) at  08/24/15 1500 Last data filed at 08/24/15 1300  Gross per 24 hour  Intake 1138.75 ml  Output      0 ml  Net 1138.75 ml   Filed Weights   08/22/15 0941 08/23/15 0617 08/24/15 0422  Weight: 60.5 kg (133 lb 6.1 oz) 60.6 kg (133 lb 9.6 oz) 61.6 kg (135 lb 12.9 oz)    Exam:   General:  Oriented 1, no acute distress  Cardiovascular: Regular rate and rhythm, S1-S2  Respiratory: Moderate inspiratory effort, decreased breath sounds bibasilar  Abdomen: soft, nontender, nondistended, positive bowel sounds  Musculoskeletal: no clubbing cyanosis or edema   Data Reviewed: Basic Metabolic Panel:  Recent Labs Lab 08/20/15 1511 08/21/15 0525 08/22/15 0509 08/23/15 0419 08/24/15 0505  NA 134* 137 135 134* 134*  K 5.3* 4.6 4.4 4.1 3.9  CL 105 105 101 99* 100*  CO2 24 25 26 26 26   GLUCOSE 95 90 91 93 89  BUN 45* 42* 40* 39* 37*  CREATININE 2.57* 2.66* 3.05* 3.32* 2.98*  CALCIUM 9.1 8.7* 8.4* 8.6* 8.2*   Liver Function Tests:  Recent Labs Lab 08/20/15 1511 08/22/15 0509 08/24/15 0505  AST 28 23 22   ALT 16 13* 12*  ALKPHOS 48 40 35*  BILITOT 0.4 0.5 0.3  PROT 6.6 6.0* 5.8*  ALBUMIN 2.6* 2.4* 2.2*   No results for input(s): LIPASE, AMYLASE in the last 168 hours. No results for input(s): AMMONIA in the last 168 hours. CBC:  Recent Labs Lab 08/20/15 1511 08/22/15 0509  WBC 8.2 7.4  NEUTROABS 6.0  --   HGB 10.2* 9.2*  HCT 32.2* 29.2*  MCV 91.7 92.7  PLT 363  320   Cardiac Enzymes:    Recent Labs Lab 08/20/15 2306 08/21/15 0525 08/21/15 1104 08/23/15 1845 08/24/15 0015  TROPONINI 0.05* 0.06* 0.05* 0.05* 0.05*   BNP (last 3 results)  Recent Labs  08/04/15 1316 08/20/15 1511  BNP 101.3* 135.9*    ProBNP (last 3 results) No results for input(s): PROBNP in the last 8760 hours.  CBG: No results for input(s): GLUCAP in the last 168 hours.  Recent Results (from the past 240 hour(s))  MRSA PCR Screening     Status: None   Collection Time: 08/21/15   3:27 AM  Result Value Ref Range Status   MRSA by PCR NEGATIVE NEGATIVE Final    Comment:        The GeneXpert MRSA Assay (FDA approved for NASAL specimens only), is one component of a comprehensive MRSA colonization surveillance program. It is not intended to diagnose MRSA infection nor to guide or monitor treatment for MRSA infections.      Studies: No results found.  Scheduled Meds: . antiseptic oral rinse  7 mL Mouth Rinse q12n4p  . aspirin EC  81 mg Oral Daily  . calcium carbonate  1 tablet Oral Daily  . chlorhexidine  15 mL Mouth Rinse BID  . enoxaparin (LOVENOX) injection  30 mg Subcutaneous Q24H  . feeding supplement (ENSURE ENLIVE)  237 mL Oral BID BM  . [START ON 08/25/2015] Influenza vac split quadrivalent PF  0.5 mL Intramuscular Tomorrow-1000  . levofloxacin  500 mg Oral Q48H  . simvastatin  5 mg Oral QHS    Continuous Infusions:    Time spent:  25 minutes  [facility] Pager [phone]. If 7PM-7AM, please contact night-coverage at [email or web address], password [facility] 08/24/2015, 3:00 PM  LOS: 4 days

## 2015-08-24 NOTE — Care Management Important Message (Signed)
Important Message  Patient Details  Name: PURITY IRMEN MRN: [id number] Date of Birth: 08/10/1919   Medicare Important Message Given:  Yes-second notification given    Camillo Flaming 08/24/2015, 11:37 AMImportant Message  Patient Details  Name: RAIN WILHIDE MRN: [id number] Date of Birth: 05/18/1919   Medicare Important Message Given:  Yes-second notification given    Camillo Flaming 08/24/2015, 11:36 AM

## 2015-08-24 NOTE — Consult Note (Signed)
Consult: pelvic organ prolapse (POP)  Chief Complaint: POP  History of Present Illness: 81 yo with known grade/stage III-IV POP with the cervix protruding out of the vaginal introitus seen by Dr. Tresa Moore in 2012 and Dr. Janice Norrie last in 2015. Pt has been manually reducing bladder to void for many years although this has been effective (last pvr in our office was 54 ml). She has known ureteral obstruction and bilateral hydronephrosis from the prolapse (U/S 2012 and 2015). She has repeatedly refused a pessary or pelvic floor reconstruction.   She's more recently been admitted for pneumonia and CHF and readmitted 9/22 with dyspnea on exertion 1 week and admitted acute diastolic heart failure and healthcare associated pneumonia. She reports no voiding complaints. No dysuria, gross hematuria or weak stream.    Past Medical History  Diagnosis Date  . Prolapsed bladder   . Rectal prolapse   . Hyperlipidemia   . Chronic kidney disease (CKD), stage III (moderate) 06/27/2012  . Bladder prolapse, female, acquired 06/27/2012  . Prolapsed internal hemorrhoids 06/27/2012  . Unspecified vitamin D deficiency   . Alzheimer's disease   . Anemia   . Hydronephrosis   . Personal history of fall   . Rectal polyp, excised 11/22/2012 01/01/2013   Past Surgical History  Procedure Laterality Date  . Appendectomy    . Joint replacement      lt hip  . Cataract extraction w/ intraocular lens  implant, bilateral    . Colonoscopy  11/22/2012    Procedure: COLONOSCOPY;  Surgeon: Shann Medal, MD;  Location: [facility];  Service: General;  Laterality: N/A;  . Polypectomy  11/22/2012    Procedure: POLYPECTOMY;  Surgeon: Shann Medal, MD;  Location: [facility];  Service: General;;  . Bilateral wrist surgeries      Home Medications:  Prescriptions prior to admission  Medication Sig Dispense Refill Last Dose  . alendronate (FOSAMAX) 70 MG tablet Take 70 mg by mouth every Wednesday. Take with a full glass of water on an empty  stomach.   08/19/2015  . aspirin EC 81 MG tablet Take 81 mg by mouth daily.   08/20/2015 at 8am  . calcium carbonate (OS-CAL - DOSED IN MG OF ELEMENTAL CALCIUM) 1250 (500 CA) MG tablet Take 1 tablet by mouth daily.   08/20/2015 at 8am  . docusate sodium (COLACE) 100 MG capsule Take 100 mg by mouth daily.   08/20/2015 at 8am  . feeding supplement, ENSURE ENLIVE, (ENSURE ENLIVE) LIQD Take 237 mLs by mouth 2 (two) times daily between meals. 237 mL 12 08/20/2015 at 10am  . Multiple Vitamin (MULTIVITAMIN WITH MINERALS) TABS Take 1 tablet by mouth daily.   08/20/2015 at 8am  . polyethylene glycol (MIRALAX / GLYCOLAX) packet Take 17 g by mouth daily.   08/20/2015 at 8am  . simvastatin (ZOCOR) 5 MG tablet Take 5 mg by mouth at bedtime.   08/19/2015 at 8pm   Allergies: No Known Allergies  Family History  Problem Relation Age of Onset  . Cancer Sister     breast  . Heart attack Father    Social History:  reports that she has never smoked. She has never used smokeless tobacco. She reports that she does not drink alcohol or use illicit drugs.  ROS: A complete review of systems was performed.  All systems are negative except for pertinent findings as noted. ROS   Physical Exam:  Vital signs in last 24 hours: Temp:  [98.1 F (36.7 C)-99.1 F (37.3 C)]  98.1 F (36.7 C) (09/26 1436) Pulse Rate:  [71-78] 75 (09/26 1436) Resp:  [18-20] 20 (09/26 1436) BP: (116-129)/(47-58) 122/57 mmHg (09/26 1436) SpO2:  [85 %-99 %] 96 % (09/26 1436) Weight:  [61.6 kg (135 lb 12.9 oz)] 61.6 kg (135 lb 12.9 oz) (09/26 0422) General:  Alert and oriented, No acute distress HEENT: Normocephalic, atraumatic Neck: No JVD or lymphadenopathy Cardiovascular: Regular rate and rhythm Lungs: Regular rate and effort Abdomen: Soft, nontender, nondistended, no abdominal masses Back: No CVA tenderness Extremities: No edema Neurologic: Grossly intact  Laboratory Data:  Results for orders placed or performed during the hospital  encounter of 08/20/15 (from the past 24 hour(s))  Troponin I     Status: Abnormal   Collection Time: 08/23/15  6:45 PM  Result Value Ref Range   Troponin I 0.05 (H) <0.031 ng/mL  Troponin I     Status: Abnormal   Collection Time: 08/24/15 12:15 AM  Result Value Ref Range   Troponin I 0.05 (H) <0.031 ng/mL  Procalcitonin     Status: None   Collection Time: 08/24/15  5:05 AM  Result Value Ref Range   Procalcitonin <0.10 ng/mL  Comprehensive metabolic panel     Status: Abnormal   Collection Time: 08/24/15  5:05 AM  Result Value Ref Range   Sodium 134 (L) 135 - 145 mmol/L   Potassium 3.9 3.5 - 5.1 mmol/L   Chloride 100 (L) 101 - 111 mmol/L   CO2 26 22 - 32 mmol/L   Glucose, Bld 89 65 - 99 mg/dL   BUN 37 (H) 6 - 20 mg/dL   Creatinine, Ser 2.98 (H) 0.44 - 1.00 mg/dL   Calcium 8.2 (L) 8.9 - 10.3 mg/dL   Total Protein 5.8 (L) 6.5 - 8.1 g/dL   Albumin 2.2 (L) 3.5 - 5.0 g/dL   AST 22 15 - 41 U/L   ALT 12 (L) 14 - 54 U/L   Alkaline Phosphatase 35 (L) 38 - 126 U/L   Total Bilirubin 0.3 0.3 - 1.2 mg/dL   GFR calc non Af Amer 12 (L) >60 mL/min   GFR calc Af Amer 14 (L) >60 mL/min   Anion gap 8 5 - 15   Recent Results (from the past 240 hour(s))  MRSA PCR Screening     Status: None   Collection Time: 08/21/15  3:27 AM  Result Value Ref Range Status   MRSA by PCR NEGATIVE NEGATIVE Final    Comment:        The GeneXpert MRSA Assay (FDA approved for NASAL specimens only), is one component of a comprehensive MRSA colonization surveillance program. It is not intended to diagnose MRSA infection nor to guide or monitor treatment for MRSA infections.    Creatinine:  Recent Labs  08/20/15 1511 08/21/15 0525 08/22/15 0509 08/23/15 0419 08/24/15 0505  CREATININE 2.57* 2.66* 3.05* 3.32* 2.98*    Impression/Assessment/plan: Stage/grade III - IV prolapse causing voiding symptoms and bilateral hydronephrosis - discussed again with patient and son nature, r/b of pessary or  colpocleisis. Discussed prolapse is leading to hydronephrosis and CRI all of which is life-threatening especially if she develops a bad UTI. All questions answered. Son said his problem is his mom won't go to the appointments when they go to pick her up. He is for it and she is against it. I recommended they see a Editor, commissioning (Dr. Garwin Brothers, Dr. Benjie Karvonen or Dr. Quincy Simmonds might be good options) who performs pessary or colpocleisis to discuss further. They would have  a pessary and could show the patient what it is like and how it is inserted. Maybe that would ease her fears. Pt said she is 88 and not very active and doesn't want anything done.   Bilateral hydronephrosis, CRI - discussed ureteral stent vs. perc tubes.with patient and son. Cr back to baseline and hydro long-standing > 4 yrs, therefore I doubt any intervention would improve kidney function but could prevent further decline. They expressed understanding and did not want any invasive action and wanted to continue surveillance.     ESKRIDGE, MATTHEW 08/24/2015, 3:56 PM

## 2015-08-25 DIAGNOSIS — I5033 Acute on chronic diastolic (congestive) heart failure: Secondary | ICD-10-CM | POA: Diagnosis not present

## 2015-08-25 LAB — BASIC METABOLIC PANEL
Anion gap: 7 (ref 5–15)
BUN: 35 mg/dL — AB (ref 6–20)
CO2: 29 mmol/L (ref 22–32)
Calcium: 8.9 mg/dL (ref 8.9–10.3)
Chloride: 98 mmol/L — ABNORMAL LOW (ref 101–111)
Creatinine, Ser: 2.71 mg/dL — ABNORMAL HIGH (ref 0.44–1.00)
GFR, EST AFRICAN AMERICAN: 16 mL/min — AB (ref >60)
GFR, EST NON AFRICAN AMERICAN: 14 mL/min — AB (ref >60)
Glucose, Bld: 129 mg/dL — ABNORMAL HIGH (ref 65–99)
POTASSIUM: 4.2 mmol/L (ref 3.5–5.1)
SODIUM: 134 mmol/L — AB (ref 135–145)

## 2015-08-25 LAB — CBC
HCT: 32.4 % — ABNORMAL LOW (ref 36.0–46.0)
HEMOGLOBIN: 10.1 g/dL — AB (ref 12.0–15.0)
MCH: 28.9 pg (ref 26.0–34.0)
MCHC: 31.2 g/dL (ref 30.0–36.0)
MCV: 92.6 fL (ref 78.0–100.0)
Platelets: 299 10*3/uL (ref 150–400)
RBC: 3.5 MIL/uL — ABNORMAL LOW (ref 3.87–5.11)
RDW: 14.9 % (ref 11.5–15.5)
WBC: 6.7 10*3/uL (ref 4.0–10.5)

## 2015-08-25 LAB — TROPONIN I: TROPONIN I: 0.05 ng/mL — AB (ref <0.031)

## 2015-08-25 NOTE — Progress Notes (Signed)
Pt states " my teeth are still numb, but my legs, arm, and jaws are not and I'm not dizzy now, But my chest has pressure in it." pt denies radiation of pain, SOB, dizziness or fatigue.  EKG done. MD text page with findings.will continue to monitor.

## 2015-08-25 NOTE — Discharge Summary (Signed)
Discharge Summary  [facility] IRJ:[id number] DOB: August 13, 1919  PCP: Gildardo Cranker, DO  Admit date: 08/20/2015 Anticipated Discharge date: 08/26/2015  Time spent: 35 minutes  Recommendations for Outpatient Follow-up:  1. Patient will follow-up if she desires with Dr.Silva, OB/GYN  2. Patient being discharged to skilled nursing (previously at assisted living )  Discharge Diagnoses:  Active Hospital Problems   Diagnosis Date Noted  . Acute diastolic heart failure 30/16/0109  . AKI (acute kidney injury) 08/24/2015  . HCAP (healthcare-associated pneumonia) 08/20/2015  . Chronic kidney disease (CKD), stage IV (severe) 08/05/2015  . Alzheimer's disease   . Bladder prolapse, female, acquired 06/27/2012    Resolved Hospital Problems   Diagnosis Date Noted Date Resolved  No resolved problems to display.    Discharge Condition: Improved, being discharged to skilled nursing   Diet recommendation: Dysphagia 3, thin liquids   Filed Weights   08/23/15 0617 08/24/15 0422 08/25/15 0552  Weight: 60.6 kg (133 lb 9.6 oz) 61.6 kg (135 lb 12.9 oz) 61.3 kg (135 lb 2.3 oz)    History of present illness:  81 year old female with past medical history of mild dementia, severe bladder prolapse, stage IV chronic kidney disease and recent admission for pneumonia and CHF came back to the hospital on 9/22 with dyspnea on exertion 1 week and admitted acute diastolic heart failure and healthcare associated pneumonia.  Hospital Course:  Principal Problem:   Acute diastolic heart failure: Patient received several days of diuresis. With rising creatinine, Lasix discontinued. Felt to be euvolemic by time of discharge. Active Problems:   Bladder prolapse, female, acquired: ongoing for some time. Noted to be severe. Patient states that she is against the pessary, but was able to convince her to at least listen to urology. Urology saw patient and recommended patient follow-up with GYN who performs pessary and  they might be able to convince patient to get this done but showing her working model and how it is inserted which might ease her fears. It was explained how prolapse could lead to hydronephrosis and worsening of her already poor renal function all of which is life-threatening. Patient stated that she is 33 not very active and does not want much done. Referral given for patient to follow-up with OB/GYN.  Chest pain: During patient's hospitalization, she would complain of chest pain and chest tightness. Serial troponins and EKG were checked which were found to be normal. Vital signs also normal. Felt to be acid reflux as patient would burp or receive Maalox which would make her feel better.    Alzheimer's disease: Stable. Patient is a DO NOT RESUSCITATE.    Chronic kidney disease (CKD), stage IV (severe)   HCAP (healthcare-associated pneumonia): Patient received several days of antibiotics, changed over to every 48 hour Levaquin. Seen by speech therapy and patient continued on her dysphagia 3 thin liquid diet. This is not felt to be aspiration. By time of discharge, she will complete 7 days of therapy    AKI (acute kidney injury) in the setting of stage IV chronic kidney disease: As of 9/27, creatinine down to 2.7, patient's baseline.  Urology discussed with patient and her son about ureteral stents and percutaneous tubes given long-standing hydronephrosis of more than 4 years, they felt that any intervention would improve her kidney function but could at least prevent further decline. At this time, both son and patient declined on stent placement   Procedures:  None   Consultations:  Urology   Discharge Exam: BP 112/55 mmHg  Pulse 80  Temp(Src) 98.4 F (36.9 C) (Oral)  Resp 20  Ht 5\' 2"  (1.575 m)  Wt 61.3 kg (135 lb 2.3 oz)  BMI 24.71 kg/m2  SpO2 97%  General: Alert and oriented 2, mild distress from discomfort  Cardiovascular: Regular rate and rhythm, S1-S2  Respiratory: Moderate  inspiratory effort, clear to auscultation bilaterally   Discharge Instructions You were cared for by a hospitalist during your hospital stay. If you have any questions about your discharge medications or the care you received while you were in the hospital after you are discharged, you can call the unit and asked to speak with the hospitalist on call if the hospitalist that took care of you is not available. Once you are discharged, your primary care physician will handle any further medical issues. Please note that NO REFILLS for any discharge medications will be authorized once you are discharged, as it is imperative that you return to your primary care physician (or establish a relationship with a primary care physician if you do not have one) for your aftercare needs so that they can reassess your need for medications and monitor your lab values.  Discharge Instructions    DIET DYS 3    Complete by:  As directed   Fluid consistency:  Thin     Increase activity slowly    Complete by:  As directed             Medication List    TAKE these medications        alendronate 70 MG tablet  Commonly known as:  FOSAMAX  Take 70 mg by mouth every Wednesday. Take with a full glass of water on an empty stomach.     aspirin EC 81 MG tablet  Take 81 mg by mouth daily.     calcium carbonate 1250 (500 CA) MG tablet  Commonly known as:  OS-CAL - dosed in mg of elemental calcium  Take 1 tablet by mouth daily.     docusate sodium 100 MG capsule  Commonly known as:  COLACE  Take 100 mg by mouth daily.     feeding supplement (ENSURE ENLIVE) Liqd  Take 237 mLs by mouth 2 (two) times daily between meals.     multivitamin with minerals Tabs tablet  Take 1 tablet by mouth daily.     polyethylene glycol packet  Commonly known as:  MIRALAX / GLYCOLAX  Take 17 g by mouth daily.     simvastatin 5 MG tablet  Commonly known as:  ZOCOR  Take 5 mg by mouth at bedtime.       No Known  Allergies    The results of significant diagnostics from this hospitalization (including imaging, microbiology, ancillary and laboratory) are listed below for reference.    Significant Diagnostic Studies: Dg Chest 2 View  08/20/2015   CLINICAL DATA:  81 year old with progressive generalized weakness over the past few weeks and recent diagnosis of pneumonia.  EXAM: CHEST  2 VIEW  COMPARISON:  08/04/2015 and earlier.  FINDINGS: AP semi-erect and lateral images were obtained. Suboptimal inspiration. Cardiac silhouette upper normal in size to mildly enlarged for technique and degree of inspiration, unchanged. Thoracic aorta mildly tortuous and atherosclerotic, unchanged. Airspace consolidation throughout both lungs, not significantly changed since the 08/04/2015 examination. No new pulmonary parenchymal abnormalities. Stable small bilateral pleural effusions. Exaggeration of the usual thoracic kyphosis.  IMPRESSION: Since the most recent chest x-ray 08/04/2015:  1. No significant change in the airspace consolidation throughout  both lungs and the associated small bilateral pleural effusions. This likely reflects a combination of CHF and pneumonia. 2. No new abnormalities.   Electronically Signed   By: Evangeline Dakin M.D.   On: 08/20/2015 17:01   Dg Chest 2 View  08/04/2015   CLINICAL DATA:  Decreased oxygen saturation today.  EXAM: CHEST  2 VIEW  COMPARISON:  PA and lateral chest 10/17/2012.  FINDINGS: There is cardiomegaly and extensive bilateral airspace disease with an appearance most compatible with pulmonary edema. Small bilateral pleural effusions are identified. No pneumothorax.  IMPRESSION: Cardiomegaly and bilateral airspace disease likely due to pulmonary edema rather than pneumonia.   Electronically Signed   By: Inge Rise M.D.   On: 08/04/2015 14:36   Ct Chest Wo Contrast  08/20/2015   CLINICAL DATA:  Hypoxia.  Recently diagnosed with pneumonia.  EXAM: CT CHEST WITHOUT CONTRAST   TECHNIQUE: Multidetector CT imaging of the chest was performed following the standard protocol without IV contrast.  COMPARISON:  Chest radiographs obtained earlier today.  FINDINGS: Small pericardial effusion with a maximum thickness of 8 mm. Small left pleural effusion and minimal right pleural effusion.  Patchy interstitial and alveolar opacities in both lower lobes, lingula, inferior left upper lobe, right middle lobe and minimally in the inferior right upper lobe. Cylindrical bronchiectasis in these areas, specially in the lower lobes. Diffuse peribronchial thickening. Mild bullous changes in the lateral aspect of the right middle lobe. No enlarged lymph nodes.  Diffuse low density of the blood relative to the arterial walls, corresponding to anemia on the patient's recent labs. Atheromatous arterial calcifications, including the coronary arteries.  Calcified granulomata in the included portion of the liver. Low-density of the included portion of the upper pole of the right kidney, relative to the left kidney. There was cortical thinning with chronic severe hydronephrosis on the right on the ultrasound dated 11/07/2012. Thoracic spine degenerative changes, including changes of DISH.  IMPRESSION: 1. Bilateral multilobar pneumonia and/or chronic changes due to bronchiectasis. 2. Chronic bronchitic changes and mild changes of COPD. 3. Atheromatous arterial calcifications, including the coronary arteries. 4. Anemia. 5. Chronic right hydronephrosis with cortical thinning, most likely explaining low density of the included portion of the upper pole of the right kidney. 6. Small pericardial effusion, small left pleural effusion and minimal right pleural effusion.   Electronically Signed   By: Claudie Revering M.D.   On: 08/20/2015 19:05   Dg Chest Port 1 View  08/23/2015   CLINICAL DATA:  Shortness of breath, cough  EXAM: PORTABLE CHEST 1 VIEW  COMPARISON:  08/20/2015  FINDINGS: Bilateral airspace disease again noted,  diffuse on the left and most confluent in the right lower lobe. This is unchanged since prior study. No definite effusions. Heart is borderline enlarged. No acute bony abnormality.  IMPRESSION: Stable bilateral airspace disease, left greater than right. This could represent asymmetric edema or infection.   Electronically Signed   By: Rolm Baptise M.D.   On: 08/23/2015 11:54    Microbiology: Recent Results (from the past 240 hour(s))  MRSA PCR Screening     Status: None   Collection Time: 08/21/15  3:27 AM  Result Value Ref Range Status   MRSA by PCR NEGATIVE NEGATIVE Final    Comment:        The GeneXpert MRSA Assay (FDA approved for NASAL specimens only), is one component of a comprehensive MRSA colonization surveillance program. It is not intended to diagnose MRSA infection nor to guide or  monitor treatment for MRSA infections.      Labs: Basic Metabolic Panel:  Recent Labs Lab 08/21/15 0525 08/22/15 0509 08/23/15 0419 08/24/15 0505 08/25/15 1049  NA 137 135 134* 134* 134*  K 4.6 4.4 4.1 3.9 4.2  CL 105 101 99* 100* 98*  CO2 25 26 26 26 29   GLUCOSE 90 91 93 89 129*  BUN 42* 40* 39* 37* 35*  CREATININE 2.66* 3.05* 3.32* 2.98* 2.71*  CALCIUM 8.7* 8.4* 8.6* 8.2* 8.9   Liver Function Tests:  Recent Labs Lab 08/20/15 1511 08/22/15 0509 08/24/15 0505  AST 28 23 22   ALT 16 13* 12*  ALKPHOS 48 40 35*  BILITOT 0.4 0.5 0.3  PROT 6.6 6.0* 5.8*  ALBUMIN 2.6* 2.4* 2.2*   No results for input(s): LIPASE, AMYLASE in the last 168 hours. No results for input(s): AMMONIA in the last 168 hours. CBC:  Recent Labs Lab 08/20/15 1511 08/22/15 0509 08/25/15 1051  WBC 8.2 7.4 6.7  NEUTROABS 6.0  --   --   HGB 10.2* 9.2* 10.1*  HCT 32.2* 29.2* 32.4*  MCV 91.7 92.7 92.6  PLT 363 320 299   Cardiac Enzymes:  Recent Labs Lab 08/21/15 0525 08/21/15 1104 08/23/15 1845 08/24/15 0015 08/25/15 1049  TROPONINI 0.06* 0.05* 0.05* 0.05* 0.05*   BNP: BNP (last 3  results)  Recent Labs  08/04/15 1316 08/20/15 1511  BNP 101.3* 135.9*    ProBNP (last 3 results) No results for input(s): PROBNP in the last 8760 hours.  CBG: No results for input(s): GLUCAP in the last 168 hours.     Signed:  Annita Brod  [facility] 08/25/2015, 5:06 PM

## 2015-08-25 NOTE — Progress Notes (Signed)
While patient was walking with PT, patient became dizzy, nauseated and having numbness in her teeth, jaws, bilateral arm and legs. See assessment for Neuro check and vital signs.  per PT pt oxygen saturation decreased into the 70's while ambulating on 2L via [place]. Oxygen was increased to 4L via [place] and oxygen saturation increased to 90% .  MD text paged with findings. Will continue to monitor.

## 2015-08-25 NOTE — Clinical Social Work Placement (Signed)
CSW reviewed PT evaluation recommending SNF at discharge - spoke with patient's son, [name] via phone (cell#: [phone]), patient had been to [facility] in the past and would prefer that she go there for rehab.     Raynaldo Opitz, [facility] Clinical Social Worker cell #: [phone]    CLINICAL SOCIAL WORK PLACEMENT  NOTE  Date:  08/25/2015  Patient Details  Name: [name] MRN: [id number] Date of Birth: 12/08/1918  Clinical Social Work is seeking post-discharge placement for this patient at the [place] level of care (*CSW will initial, date and re-position this form in  chart as items are completed):  Yes   Patient/family provided with [place] Work Department's list of facilities offering this level of care within the geographic area requested by the patient (or if unable, by the patient's family).  Yes   Patient/family informed of their freedom to choose among providers that offer the needed level of care, that participate in Medicare, Medicaid or managed care program needed by the patient, have an available bed and are willing to accept the patient.  Yes   Patient/family informed of [facility]'s ownership interest in [facility] and [facility], as well as of the fact that they are under no obligation to receive care at these facilities.  PASRR submitted to EDS on 08/25/15     PASRR number received on 08/25/15     Existing PASRR number confirmed on       FL2 transmitted to all facilities in geographic area requested by pt/family on 08/25/15     FL2 transmitted to all facilities within larger geographic area on       Patient informed that his/her managed care company has contracts with or will negotiate with certain facilities, including the following:            Patient/family informed of bed offers received.  Patient chooses bed at       Physician recommends and patient chooses bed at       Patient to be transferred to   on  .  Patient to be transferred to facility by       Patient family notified on   of transfer.  Name of family member notified:        PHYSICIAN       Additional Comment:    _______________________________________________ Standley Brooking, LCSW 08/25/2015, 4:38 PM

## 2015-08-25 NOTE — Progress Notes (Signed)
Pt had a incontinent, medium, loose brown stool. Pt states my chest don't hurt now, but my teeth are still numb." Will continue to monitor.

## 2015-08-25 NOTE — Evaluation (Signed)
Physical Therapy Evaluation Patient Details Name: [name] MRN: [id number] DOB: 10/10/1919 Today's Date: 08/25/2015   History of Present Illness  81 year old female with past medical history of mild dementia, severe bladder prolapse, stage IV chronic kidney disease and recent admission for pneumonia and CHF came back to the hospital on 9/22 with dyspnea on exertion 1 week and admitted acute diastolic heart failure and healthcare associated pneumonia  Clinical Impression  Pt admitted with above diagnosis. Pt currently with functional limitations due to the deficits listed below (see PT Problem List).  Pt will benefit from skilled PT to increase their independence and safety with mobility to allow discharge to the venue listed below.  Pt's evaluation limited by facial numbness and some dizziness during gait.  RN in to assess pt.    SATURATION QUALIFICATIONS: (This note is used to comply with regulatory documentation for home oxygen)  Patient Saturations on Room Air at Rest = 92%  Patient Saturations on Room Air upon sitting EOB = 79% so 2L O2 applied for gait  Patient Saturations on 2 Liters of oxygen while Ambulating = 78% and required 4L O2 to improve to 90%  Please briefly explain why patient needs home oxygen: to maintain oxygen saturations above 88% during physical exertion     Follow Up Recommendations SNF;Supervision/Assistance - 24 hour    Equipment Recommendations  None recommended by PT    Recommendations for Other Services       Precautions / Restrictions Precautions Precautions: Fall Precaution Comments: monitor sats Restrictions Weight Bearing Restrictions: No      Mobility  Bed Mobility Overal bed mobility: Needs Assistance Bed Mobility: Supine to Sit     Supine to sit: Supervision        Transfers Overall transfer level: Needs assistance Equipment used: Rolling walker (2 wheeled) Transfers: Sit to/from Stand Sit to Stand: Min assist          General transfer comment: verbal cues for hand placement and technique, assist to rise and control descent  Ambulation/Gait Ambulation/Gait assistance: Min assist Ambulation Distance (Feet): 10 Feet Assistive device: Rolling walker (2 wheeled) Gait Pattern/deviations: Step-through pattern;Trunk flexed;Decreased stride length     General Gait Details: SPO2 dropped to 78% on 2L O2 so increased to 4L O2 for increase to 90%; pt reports numbess in face and behind eyes however states no to dizziness and then yes, recliner pulled behind pt to sit down, RN into assess pt, vitals taken upon return to room in recliner: 119/57 mmHg, 85 bpm, and SPO2 97% on 2L O2  Stairs            Wheelchair Mobility    Modified Rankin (Stroke Patients Only)       Balance                                             Pertinent Vitals/Pain      Home Living Family/patient expects to be discharged to:: Assisted living                 Additional Comments: Pt was living alone with son checking on her daily prior to last admission.  Pt admitted from ALF    Prior Function Level of Independence: Needs assistance   Gait / Transfers Assistance Needed: ambulated with ALF staff, uses RW           Hand  Dominance        Extremity/Trunk Assessment               Lower Extremity Assessment: Generalized weakness         Communication   Communication: HOH  Cognition Arousal/Alertness: Awake/alert Behavior During Therapy: WFL for tasks assessed/performed Overall Cognitive Status: History of cognitive impairments - at baseline                      General Comments      Exercises        Assessment/Plan    PT Assessment Patient needs continued PT services  PT Diagnosis Difficulty walking;Generalized weakness   PT Problem List Decreased strength;Decreased activity tolerance;Decreased mobility;Decreased balance;Cardiopulmonary status limiting  activity;Decreased knowledge of precautions;Decreased safety awareness;Decreased knowledge of use of DME  PT Treatment Interventions DME instruction;Gait training;Functional mobility training;Therapeutic activities;Patient/family education;Therapeutic exercise   PT Goals (Current goals can be found in the Care Plan section) Acute Rehab PT Goals PT Goal Formulation: With patient/family Time For Goal Achievement: 09/01/15 Potential to Achieve Goals: Good    Frequency Min 3X/week   Barriers to discharge        Co-evaluation               End of Session Equipment Utilized During Treatment: Gait belt Activity Tolerance: Treatment limited secondary to medical complications (Comment) (low saturations, numbness of face) Patient left: in chair;with call bell/phone within reach;with chair alarm set;with nursing/sitter in room;with family/visitor present           Time: 5537-4827 PT Time Calculation (min) (ACUTE ONLY): 30 min   Charges:   PT Evaluation $Initial PT Evaluation Tier I: 1 Procedure     PT G Codes:        LEMYRE,KATHrine E 08/25/2015, 12:12 PM Carmelia Bake, PT, DPT 08/25/2015 Pager: [phone]

## 2015-08-26 LAB — BASIC METABOLIC PANEL
ANION GAP: 7 (ref 5–15)
BUN: 34 mg/dL — ABNORMAL HIGH (ref 6–20)
CALCIUM: 8.8 mg/dL — AB (ref 8.9–10.3)
CO2: 28 mmol/L (ref 22–32)
Chloride: 99 mmol/L — ABNORMAL LOW (ref 101–111)
Creatinine, Ser: 2.6 mg/dL — ABNORMAL HIGH (ref 0.44–1.00)
GFR, EST AFRICAN AMERICAN: 17 mL/min — AB (ref >60)
GFR, EST NON AFRICAN AMERICAN: 15 mL/min — AB (ref >60)
Glucose, Bld: 91 mg/dL (ref 65–99)
Potassium: 4.3 mmol/L (ref 3.5–5.1)
SODIUM: 134 mmol/L — AB (ref 135–145)

## 2015-08-26 NOTE — Progress Notes (Signed)
Nutrition Follow-up  DOCUMENTATION CODES:   Not applicable  INTERVENTION:  - Continue Ensure Enlive BID - RD will continue to monitor for needs if pt unable to d/c  NUTRITION DIAGNOSIS:   Predicted suboptimal nutrient intake related to lethargy/confusion as evidenced by per patient/family report. -N/A  No new nutrition dx  GOAL:   Patient will meet greater than or equal to 90% of their needs -variably met  MONITOR:   PO intake, Supplement acceptance, Weight trends, Labs, I & O's  ASSESSMENT:   81 yo female presented for weakness during physical therapy. Pmh of dementia, severe bladder prolapse, recent admission for pneumonia and CHF and CKD stage III. Pt was admitted on September 4 for PNA and acute on chronic CHF. Was rehabbing with physical therapist when PT noticed shortness of breath. Pt  had trouble recalling history, did not exhibit malnutrition, except at temples. Said she has not lost much weight, chart shows 13#/8% loss in 4 months.  9/28 Per chart review, pt to d/c to SNF when bed available and d/c summary wrote yesterday. On 9/25 pt ate 30% breakfast and 45% lunch, on 9/26 she ate 50% breakfast, 75% lunch, and 50% dinner, and today she ate 80% of breakfast. Ensure Enlive ordered BID.  Pt with hx of dementia. Variably meeting needs at this time. Medications reviewed. Labs reviewed; Na: 134 mmol/L, Cl: 99 mmol/L, BUN/creatinine elevated but trending down, GFR: 15.   9/23 - Pt states that she ate vegetable soup for lunch today without issue.  - She denies chewing/swallowing issues and states she had a good appetite PTA.  - Per chart review, pt's family had mentioned in the ED that she had not been eating for a few days PTA. - She states that she lives alone and her son brings groceries for her.  - She is not able to provide much other information at this time.  - No muscle or fat wasting noted. Per weight hx review, pt has lost 9 lbs (6% body weight) in the past 3  months which is not significant for time frame.  Diet Order:  Diet Heart Room service appropriate?: Yes; Fluid consistency:: Thin DIET DYS 3 Diet - low sodium heart healthy  Skin:  Reviewed, no issues  Last BM:  9/27  Height:   Ht Readings from Last 1 Encounters:  08/22/15 _0  (1.575 m)    Weight:   Wt Readings from Last 1 Encounters:  08/26/15 134 lb 11.2 oz (61.1 kg)    Ideal Body Weight:  45.45 kg  BMI:  Body mass index is 24.63 kg/(m^2).  Estimated Nutritional Needs:   Kcal:  1200-1400  Protein:  60-70 grams  Fluid:  2 L/day  EDUCATION NEEDS:   No education needs identified at this time     Jarome Matin, RD, LDN Inpatient Clinical Dietitian Pager # [phone] After hours/weekend pager # [phone]

## 2015-08-26 NOTE — Clinical Social Work Placement (Signed)
Patient is set to discharge to [facility] today. Patient & son, [name] aware. Discharge packet given to RN, [facility]. [facility] called for transport to pickup at 1:00pm.     Raynaldo Opitz, [place] Social Worker cell #: [phone]    CLINICAL SOCIAL WORK PLACEMENT  NOTE  Date:  08/26/2015  Patient Details  Name: [name] MRN: [id number] Date of Birth: Feb 01, 1919  Clinical Social Work is seeking post-discharge placement for this patient at the [place] level of care (*CSW will initial, date and re-position this form in  chart as items are completed):  Yes   Patient/family provided with [place] Work Department's list of facilities offering this level of care within the geographic area requested by the patient (or if unable, by the patient's family).  Yes   Patient/family informed of their freedom to choose among providers that offer the needed level of care, that participate in Medicare, Medicaid or managed care program needed by the patient, have an available bed and are willing to accept the patient.  Yes   Patient/family informed of [facility]'s ownership interest in [facility] and [facility], as well as of the fact that they are under no obligation to receive care at these facilities.  PASRR submitted to EDS on 08/25/15     PASRR number received on 08/25/15     Existing PASRR number confirmed on       FL2 transmitted to all facilities in geographic area requested by pt/family on 08/25/15     FL2 transmitted to all facilities within larger geographic area on       Patient informed that his/her managed care company has contracts with or will negotiate with certain facilities, including the following:        Yes   Patient/family informed of bed offers received.  Patient chooses bed at [facility]     Physician recommends and patient chooses bed at      Patient to be transferred to [facility] on 08/26/15.  Patient to be transferred to facility by [facility]     Patient family notified on 08/26/15 of transfer.  Name of family member notified:  patient's son, [name] via phone     PHYSICIAN       Additional Comment:    _______________________________________________ Standley Brooking, LCSW 08/26/2015, 10:50 AM

## 2015-08-26 NOTE — Care Management Note (Signed)
Case Management Note  Patient Details  Name: [name] MRN: [id number] Date of Birth: [birth year]  Subjective/Objective:                    Action/Plan:d/c SNF.   Expected Discharge Date:   (unknown)               Expected Discharge Plan:  [place]  In-House Referral:     Discharge planning Services  CM Consult  Post Acute Care Choice:    Choice offered to:     DME Arranged:    DME Agency:     HH Arranged:    [place] Agency:     Status of Service:  Completed, signed off  Medicare Important Message Given:  Yes-second notification given Date Medicare IM Given:    Medicare IM give by:    Date Additional Medicare IM Given:    Additional Medicare Important Message give by:     If discussed at [place] of Stay Meetings, dates discussed:    Additional Comments:  Dessa Phi, RN 08/26/2015, 12:22 PM

## 2015-08-26 NOTE — Progress Notes (Signed)
Report called to [facility], LPN at [facility]. Patient transported to [facility] by EMS. Pt stable at time of discharge.

## 2015-08-26 NOTE — Progress Notes (Signed)
Pt seen and examined at bedside. Stable for d/c to SNF. Please see Dr.KRishnan summary, it was reviewed and no changes needed.   Faye Ramsay, MD  [facility] Pager [phone]  If 7PM-7AM, please contact night-coverage [email or web address] Password [email or web address]

## 2015-08-26 NOTE — Discharge Instructions (Signed)
Chronic Kidney Disease °Chronic kidney disease occurs when the kidneys are damaged over a long period. The kidneys are two organs that lie on either side of the spine between the middle of the back and the front of the abdomen. The kidneys:  °· Remove wastes and extra water from the blood.   °· Produce important hormones. These help keep bones strong, regulate blood pressure, and help create red blood cells.   °· Balance the fluids and chemicals in the blood and tissues. °A small amount of kidney damage may not cause problems, but a large amount of damage may make it difficult or impossible for the kidneys to work the way they should. If steps are not taken to slow down the kidney damage or stop it from getting worse, the kidneys may stop working permanently. Most of the time, chronic kidney disease does not go away. However, it can often be controlled, and those with the disease can usually live normal lives. °CAUSES  °The most common causes of chronic kidney disease are diabetes and high blood pressure (hypertension). Chronic kidney disease may also be caused by:  °· Diseases that cause the kidneys' filters to become inflamed.   °· Diseases that affect the immune system.   °· Genetic diseases.   °· Medicines that damage the kidneys, such as anti-inflammatory medicines.   °· Poisoning or exposure to toxic substances.   °· A reoccurring kidney or urinary infection.   °· A problem with urine flow. This may be caused by:   °¨ Cancer.   °¨ Kidney stones.   °¨ An enlarged prostate in males. °SIGNS AND SYMPTOMS  °Because the kidney damage in chronic kidney disease occurs slowly, symptoms develop slowly and may not be obvious until the kidney damage becomes severe. A person may have a kidney disease for years without showing any symptoms. Symptoms can include:  °· Swelling (edema) of the legs, ankles, or feet.   °· Tiredness (lethargy).   °· Nausea or vomiting.   °· Confusion.   °· Problems with urination, such as:    °¨ Decreased urine production.   °¨ Frequent urination, especially at night.   °¨ Frequent accidents in children who are potty trained.   °· Muscle twitches and cramps.   °· Shortness of breath.  °· Weakness.   °· Persistent itchiness.   °· Loss of appetite. °· Metallic taste in the mouth. °· Trouble sleeping. °· Slowed development in children. °· Short stature in children. °DIAGNOSIS  °Chronic kidney disease may be detected and diagnosed by tests, including blood, urine, imaging, or kidney biopsy tests.  °TREATMENT  °Most chronic kidney diseases cannot be cured. Treatment usually involves relieving symptoms and preventing or slowing the progression of the disease. Treatment may include:  °· A special diet. You may need to avoid alcohol and foods that are salty and high in potassium.   °· Medicines. These may:   °¨ Lower blood pressure.   °¨ Relieve anemia.   °¨ Relieve swelling.   °¨ Protect the bones. °HOME CARE INSTRUCTIONS  °· Follow your prescribed diet.   °· Take medicines only as directed by your health care provider. Do not take any new medicines (prescription, over-the-counter, or nutritional supplements) unless approved by your health care provider. Many medicines can worsen your kidney damage or need to have the dose adjusted.   °· Quit smoking if you smoke. Talk to your health care provider about a smoking cessation program.   °· Keep all follow-up visits as directed by your health care provider. °SEEK IMMEDIATE MEDICAL CARE IF: °· Your symptoms get worse or you develop new symptoms.   °· You develop symptoms of end-stage kidney disease. These   include:   °¨ Headaches.   °¨ Abnormally dark or light skin.   °¨ Numbness in the hands or feet.   °¨ Easy bruising.   °¨ Frequent hiccups.   °¨ Menstruation stops.   °· You have a fever.   °· You have decreased urine production.   °· You have pain or bleeding when urinating. °MAKE SURE YOU: °· Understand these instructions. °· Will watch your condition. °· Will  get help right away if you are not doing well or get worse. °FOR MORE INFORMATION  °· American Association of Kidney Patients: www.aakp.org °· National Kidney Foundation: www.kidney.org °· American Kidney Fund: www.akfinc.org °· [employer] Program: www.lifeoptions.org and www.kidneyschool.org °Document Released: 08/23/2008 Document Revised: 03/31/2014 Document Reviewed: 07/13/2012 °ExitCare® Patient Information ©2015 [employer]. This information is not intended to replace advice given to you by your health care provider. Make sure you discuss any questions you have with your health care provider. ° °

## 2015-08-27 ENCOUNTER — Non-Acute Institutional Stay (SKILLED_NURSING_FACILITY): Payer: Medicare Other | Admitting: Adult Health

## 2015-08-27 ENCOUNTER — Encounter: Payer: Self-pay | Admitting: Adult Health

## 2015-08-27 ENCOUNTER — Telehealth: Payer: Self-pay | Admitting: *Deleted

## 2015-08-27 DIAGNOSIS — K5901 Slow transit constipation: Secondary | ICD-10-CM | POA: Diagnosis not present

## 2015-08-27 DIAGNOSIS — G309 Alzheimer's disease, unspecified: Secondary | ICD-10-CM | POA: Diagnosis not present

## 2015-08-27 DIAGNOSIS — D638 Anemia in other chronic diseases classified elsewhere: Secondary | ICD-10-CM

## 2015-08-27 DIAGNOSIS — R5381 Other malaise: Secondary | ICD-10-CM | POA: Diagnosis not present

## 2015-08-27 DIAGNOSIS — N184 Chronic kidney disease, stage 4 (severe): Secondary | ICD-10-CM

## 2015-08-27 DIAGNOSIS — N8110 Cystocele, unspecified: Secondary | ICD-10-CM

## 2015-08-27 DIAGNOSIS — F0280 Dementia in other diseases classified elsewhere without behavioral disturbance: Secondary | ICD-10-CM

## 2015-08-27 DIAGNOSIS — E43 Unspecified severe protein-calorie malnutrition: Secondary | ICD-10-CM

## 2015-08-27 DIAGNOSIS — E785 Hyperlipidemia, unspecified: Secondary | ICD-10-CM

## 2015-08-27 DIAGNOSIS — I5031 Acute diastolic (congestive) heart failure: Secondary | ICD-10-CM | POA: Diagnosis not present

## 2015-08-27 NOTE — Telephone Encounter (Signed)
Received a call from Lynnell Jude with [place] [employer] she left a message stating that she is conducting a survey and wanted to know the timeframe of the chest x-ray ordered on 9/19//2016. Who ordered, when we received it, and how long it took to get back to patient with results. (see telephone message dated 08/20/15).  Given information to [place].

## 2015-08-27 NOTE — Progress Notes (Signed)
Patient ID: [name], female   DOB: 15-Apr-1919, 81 y.o.   MRN: [id number]    DATE:  08/27/2015 MRN:  [id number]  BIRTHDAY: 1919-09-29  Facility:  [facility]ursing Home Location:  [place] Room Number: 1007-P  LEVEL OF CARE:  [id number]  Contact Information    Name Ellsworth Son [phone]  [phone]   Cody,Linda Relative [phone]  [phone]       Chief Complaint  Patient presents with  . Hospitalization Follow-up    Physical deconditioning, CHF, bladder prolapse, Alzheimer's disease, chronic kidney disease, constipation, hyperlipidemia, anemia and protein calorie malnutrition    HISTORY OF PRESENT ILLNESS:  This is a 81 year old female who has been admitted to [facility] on 08/26/15 from [facility]. She has PMH of mild dementia, severe bladder prolapse, chronic kidney disease stage IV and recently hospitalized due to pneumonia and CHF. She was transferred to the hospital from an assisted living facility on 9/22 due to dyspnea on exertion 1 week and was treated for acute diastolic heart failure and healthcare associated pneumonia.  She has been admitted for a short-term rehabilitation.  PAST MEDICAL HISTORY:  Past Medical History  Diagnosis Date  . Prolapsed bladder   . Rectal prolapse   . Hyperlipidemia   . Chronic kidney disease (CKD), stage III (moderate) 06/27/2012  . Bladder prolapse, female, acquired 06/27/2012  . Prolapsed internal hemorrhoids 06/27/2012  . Unspecified vitamin D deficiency   . Alzheimer's disease   . Anemia   . Hydronephrosis   . Personal history of fall   . Rectal polyp, excised 11/22/2012 01/01/2013     CURRENT MEDICATIONS: Reviewed  Patient's Medications  New Prescriptions   No medications on file  Previous Medications   ASPIRIN EC 81 MG TABLET    Take 81 mg by mouth daily.   CALCIUM CARBONATE (OS-CAL - DOSED IN MG OF ELEMENTAL CALCIUM) 1250 (500 CA) MG TABLET     Take 1 tablet by mouth daily.   DOCUSATE SODIUM (COLACE) 100 MG CAPSULE    Take 100 mg by mouth daily.   FEEDING SUPPLEMENT, ENSURE ENLIVE, (ENSURE ENLIVE) LIQD    Take 237 mLs by mouth 2 (two) times daily between meals.   MULTIPLE VITAMIN (MULTIVITAMIN WITH MINERALS) TABS    Take 1 tablet by mouth daily.   POLYETHYLENE GLYCOL (MIRALAX / GLYCOLAX) PACKET    Take 17 g by mouth daily.   SIMVASTATIN (ZOCOR) 5 MG TABLET    Take 5 mg by mouth at bedtime.  Modified Medications   No medications on file  Discontinued Medications   ALENDRONATE (FOSAMAX) 70 MG TABLET    Take 70 mg by mouth every Wednesday. Take with a full glass of water on an empty stomach.     No Known Allergies   REVIEW OF SYSTEMS:  GENERAL: no change in appetite, no fatigue, no weight changes, no fever, chills or weakness EYES: Denies change in vision, dry eyes, eye pain, itching or discharge EARS: Denies change in hearing, ringing in ears, or earache NOSE: Denies nasal congestion or epistaxis MOUTH and THROAT: Denies oral discomfort, gingival pain or bleeding, pain from teeth or hoarseness   RESPIRATORY: no cough, SOB, DOE, wheezing, hemoptysis CARDIAC: no chest pain, edema or palpitations GI: no abdominal pain, diarrhea, constipation, heart burn, nausea or vomiting GU: Denies dysuria, frequency, hematuria, incontinence, or discharge PSYCHIATRIC: Denies feeling of depression or anxiety. No report of hallucinations, insomnia, paranoia,  or agitation    PHYSICAL EXAMINATION   GENERAL APPEARANCE:  In no acute distress. Normal body habitus HEAD: Normal in size and contour. No evidence of trauma EYES: Lids open and close normally. No blepharitis, entropion or ectropion. PERRL. Conjunctivae are clear and sclerae are white. Lenses are without opacity EARS: Pinnae are normal. Patient hears normal voice tunes of the examiner MOUTH and THROAT: Lips are without lesions. Oral mucosa is moist and without lesions. Tongue is normal  in shape, size, and color and without lesions NECK: supple, trachea midline, no neck masses, no thyroid tenderness, no thyromegaly LYMPHATICS: no LAN in the neck, no supraclavicular LAN RESPIRATORY: breathing is even & unlabored, BS CTAB CARDIAC: RRR, no murmur,no extra heart sounds, no edema GI: abdomen soft, normal BS, no masses, no tenderness, no hepatomegaly, no splenomegaly GU:  Has bladder prolapse EXTREMITIES:  Able to move 4 extremities PSYCHIATRIC: Alert and oriented X 3. Affect and behavior are appropriate  LABS/RADIOLOGY: Labs reviewed: Basic Metabolic Panel:  Recent Labs  08/24/15 0505 08/25/15 1049 08/26/15 0544  NA 134* 134* 134*  K 3.9 4.2 4.3  CL 100* 98* 99*  CO2 26 29 28   GLUCOSE 89 129* 91  BUN 37* 35* 34*  CREATININE 2.98* 2.71* 2.60*  CALCIUM 8.2* 8.9 8.8*   Liver Function Tests:  Recent Labs  08/20/15 1511 08/22/15 0509 08/24/15 0505  AST 28 23 22   ALT 16 13* 12*  ALKPHOS 48 40 35*  BILITOT 0.4 0.5 0.3  PROT 6.6 6.0* 5.8*  ALBUMIN 2.6* 2.4* 2.2*   CBC:  Recent Labs  10/15/14 1213 08/04/15 1316  08/20/15 1511 08/22/15 0509 08/25/15 1051  WBC 6.6 7.8  < > 8.2 7.4 6.7  NEUTROABS 4.0 6.3  --  6.0  --   --   HGB 9.9* 10.8*  < > 10.2* 9.2* 10.1*  HCT 30.1* 34.4*  < > 32.2* 29.2* 32.4*  MCV 91 91.5  < > 91.7 92.7 92.6  PLT  --  363  < > 363 320 299  < > = values in this interval not displayed.  Lipid Panel:  Recent Labs  05/08/15 1235  HDL 46   Cardiac Enzymes:  Recent Labs  08/23/15 1845 08/24/15 0015 08/25/15 1049  TROPONINI 0.05* 0.05* 0.05*    Dg Chest 2 View  08/20/2015   CLINICAL DATA:  81 year old with progressive generalized weakness over the past few weeks and recent diagnosis of pneumonia.  EXAM: CHEST  2 VIEW  COMPARISON:  08/04/2015 and earlier.  FINDINGS: AP semi-erect and lateral images were obtained. Suboptimal inspiration. Cardiac silhouette upper normal in size to mildly enlarged for technique and degree of  inspiration, unchanged. Thoracic aorta mildly tortuous and atherosclerotic, unchanged. Airspace consolidation throughout both lungs, not significantly changed since the 08/04/2015 examination. No new pulmonary parenchymal abnormalities. Stable small bilateral pleural effusions. Exaggeration of the usual thoracic kyphosis.  IMPRESSION: Since the most recent chest x-ray 08/04/2015:  1. No significant change in the airspace consolidation throughout both lungs and the associated small bilateral pleural effusions. This likely reflects a combination of CHF and pneumonia. 2. No new abnormalities.   Electronically Signed   By: Evangeline Dakin M.D.   On: 08/20/2015 17:01   Dg Chest 2 View  08/04/2015   CLINICAL DATA:  Decreased oxygen saturation today.  EXAM: CHEST  2 VIEW  COMPARISON:  PA and lateral chest 10/17/2012.  FINDINGS: There is cardiomegaly and extensive bilateral airspace disease with an appearance most compatible with pulmonary  edema. Small bilateral pleural effusions are identified. No pneumothorax.  IMPRESSION: Cardiomegaly and bilateral airspace disease likely due to pulmonary edema rather than pneumonia.   Electronically Signed   By: Inge Rise M.D.   On: 08/04/2015 14:36   Ct Chest Wo Contrast  08/20/2015   CLINICAL DATA:  Hypoxia.  Recently diagnosed with pneumonia.  EXAM: CT CHEST WITHOUT CONTRAST  TECHNIQUE: Multidetector CT imaging of the chest was performed following the standard protocol without IV contrast.  COMPARISON:  Chest radiographs obtained earlier today.  FINDINGS: Small pericardial effusion with a maximum thickness of 8 mm. Small left pleural effusion and minimal right pleural effusion.  Patchy interstitial and alveolar opacities in both lower lobes, lingula, inferior left upper lobe, right middle lobe and minimally in the inferior right upper lobe. Cylindrical bronchiectasis in these areas, specially in the lower lobes. Diffuse peribronchial thickening. Mild bullous changes in  the lateral aspect of the right middle lobe. No enlarged lymph nodes.  Diffuse low density of the blood relative to the arterial walls, corresponding to anemia on the patient's recent labs. Atheromatous arterial calcifications, including the coronary arteries.  Calcified granulomata in the included portion of the liver. Low-density of the included portion of the upper pole of the right kidney, relative to the left kidney. There was cortical thinning with chronic severe hydronephrosis on the right on the ultrasound dated 11/07/2012. Thoracic spine degenerative changes, including changes of DISH.  IMPRESSION: 1. Bilateral multilobar pneumonia and/or chronic changes due to bronchiectasis. 2. Chronic bronchitic changes and mild changes of COPD. 3. Atheromatous arterial calcifications, including the coronary arteries. 4. Anemia. 5. Chronic right hydronephrosis with cortical thinning, most likely explaining low density of the included portion of the upper pole of the right kidney. 6. Small pericardial effusion, small left pleural effusion and minimal right pleural effusion.   Electronically Signed   By: Claudie Revering M.D.   On: 08/20/2015 19:05   Dg Chest Port 1 View  08/23/2015   CLINICAL DATA:  Shortness of breath, cough  EXAM: PORTABLE CHEST 1 VIEW  COMPARISON:  08/20/2015  FINDINGS: Bilateral airspace disease again noted, diffuse on the left and most confluent in the right lower lobe. This is unchanged since prior study. No definite effusions. Heart is borderline enlarged. No acute bony abnormality.  IMPRESSION: Stable bilateral airspace disease, left greater than right. This could represent asymmetric edema or infection.   Electronically Signed   By: Rolm Baptise M.D.   On: 08/23/2015 11:54    ASSESSMENT/PLAN:  Physical deconditioning - for rehabilitation  Acute diastolic heart failure - was diuresed several days and with rising creatinine, Lasix was discontinued; stable; weigh every Mondays and  Wednesdays  Bladder prolapse - urology consult recommended GYN consult for possible pessary  Alzheimer's disease - stable  Acute on chronic kidney disease stage IV - creatinine 2.60, patient's baseline; urology discussed ureteral stents but son and patient declined; check BMP  Constipation - continue Colace 100 mg by mouth daily and MiraLAX 17 g last 6-8 ounces liquid by mouth daily  Hyperlipidemia - continue simvastatin 5 mg 1 tab by mouth daily  Anemia of chronic disease - hemoglobin 10.0; check CBC  Protein calorie malnutrition, severe - albumin 2.4; RD consult     Goals of care:  Short-term rehabilitation    [facility], NP [facility] [phone]

## 2015-08-31 ENCOUNTER — Inpatient Hospital Stay (HOSPITAL_COMMUNITY)
Admission: EM | Admit: 2015-08-31 | Discharge: 2015-09-29 | DRG: 193 | Disposition: E | Payer: Medicare Other | Attending: Internal Medicine | Admitting: Internal Medicine

## 2015-08-31 ENCOUNTER — Emergency Department (HOSPITAL_COMMUNITY): Payer: Medicare Other

## 2015-08-31 ENCOUNTER — Encounter (HOSPITAL_COMMUNITY): Payer: Self-pay | Admitting: *Deleted

## 2015-08-31 DIAGNOSIS — Z9841 Cataract extraction status, right eye: Secondary | ICD-10-CM

## 2015-08-31 DIAGNOSIS — Z66 Do not resuscitate: Secondary | ICD-10-CM | POA: Diagnosis present

## 2015-08-31 DIAGNOSIS — Z961 Presence of intraocular lens: Secondary | ICD-10-CM | POA: Diagnosis present

## 2015-08-31 DIAGNOSIS — K029 Dental caries, unspecified: Secondary | ICD-10-CM | POA: Diagnosis present

## 2015-08-31 DIAGNOSIS — R0602 Shortness of breath: Secondary | ICD-10-CM

## 2015-08-31 DIAGNOSIS — G309 Alzheimer's disease, unspecified: Secondary | ICD-10-CM

## 2015-08-31 DIAGNOSIS — F0280 Dementia in other diseases classified elsewhere without behavioral disturbance: Secondary | ICD-10-CM | POA: Diagnosis present

## 2015-08-31 DIAGNOSIS — K219 Gastro-esophageal reflux disease without esophagitis: Secondary | ICD-10-CM | POA: Diagnosis present

## 2015-08-31 DIAGNOSIS — E871 Hypo-osmolality and hyponatremia: Secondary | ICD-10-CM | POA: Diagnosis present

## 2015-08-31 DIAGNOSIS — Y95 Nosocomial condition: Secondary | ICD-10-CM | POA: Diagnosis present

## 2015-08-31 DIAGNOSIS — N184 Chronic kidney disease, stage 4 (severe): Secondary | ICD-10-CM | POA: Diagnosis present

## 2015-08-31 DIAGNOSIS — Z7982 Long term (current) use of aspirin: Secondary | ICD-10-CM | POA: Diagnosis not present

## 2015-08-31 DIAGNOSIS — J189 Pneumonia, unspecified organism: Principal | ICD-10-CM | POA: Diagnosis present

## 2015-08-31 DIAGNOSIS — Z9842 Cataract extraction status, left eye: Secondary | ICD-10-CM | POA: Diagnosis not present

## 2015-08-31 DIAGNOSIS — Z803 Family history of malignant neoplasm of breast: Secondary | ICD-10-CM

## 2015-08-31 DIAGNOSIS — D649 Anemia, unspecified: Secondary | ICD-10-CM | POA: Diagnosis present

## 2015-08-31 DIAGNOSIS — N8110 Cystocele, unspecified: Secondary | ICD-10-CM | POA: Diagnosis present

## 2015-08-31 DIAGNOSIS — R509 Fever, unspecified: Secondary | ICD-10-CM | POA: Diagnosis present

## 2015-08-31 DIAGNOSIS — I5033 Acute on chronic diastolic (congestive) heart failure: Secondary | ICD-10-CM | POA: Diagnosis present

## 2015-08-31 DIAGNOSIS — E860 Dehydration: Secondary | ICD-10-CM | POA: Diagnosis present

## 2015-08-31 DIAGNOSIS — I5032 Chronic diastolic (congestive) heart failure: Secondary | ICD-10-CM | POA: Diagnosis not present

## 2015-08-31 DIAGNOSIS — G301 Alzheimer's disease with late onset: Secondary | ICD-10-CM | POA: Diagnosis not present

## 2015-08-31 DIAGNOSIS — J9601 Acute respiratory failure with hypoxia: Secondary | ICD-10-CM | POA: Diagnosis not present

## 2015-08-31 DIAGNOSIS — J479 Bronchiectasis, uncomplicated: Secondary | ICD-10-CM | POA: Diagnosis present

## 2015-08-31 DIAGNOSIS — Z8249 Family history of ischemic heart disease and other diseases of the circulatory system: Secondary | ICD-10-CM

## 2015-08-31 DIAGNOSIS — Z515 Encounter for palliative care: Secondary | ICD-10-CM | POA: Diagnosis not present

## 2015-08-31 DIAGNOSIS — E559 Vitamin D deficiency, unspecified: Secondary | ICD-10-CM | POA: Diagnosis present

## 2015-08-31 DIAGNOSIS — Z8701 Personal history of pneumonia (recurrent): Secondary | ICD-10-CM

## 2015-08-31 DIAGNOSIS — N179 Acute kidney failure, unspecified: Secondary | ICD-10-CM | POA: Diagnosis present

## 2015-08-31 DIAGNOSIS — E785 Hyperlipidemia, unspecified: Secondary | ICD-10-CM | POA: Diagnosis present

## 2015-08-31 DIAGNOSIS — Z79899 Other long term (current) drug therapy: Secondary | ICD-10-CM | POA: Diagnosis not present

## 2015-08-31 DIAGNOSIS — G300 Alzheimer's disease with early onset: Secondary | ICD-10-CM | POA: Diagnosis not present

## 2015-08-31 LAB — I-STAT CHEM 8, ED
BUN: 38 mg/dL — ABNORMAL HIGH (ref 6–20)
CALCIUM ION: 1.23 mmol/L (ref 1.13–1.30)
CHLORIDE: 97 mmol/L — AB (ref 101–111)
Creatinine, Ser: 2.6 mg/dL — ABNORMAL HIGH (ref 0.44–1.00)
Glucose, Bld: 101 mg/dL — ABNORMAL HIGH (ref 65–99)
HEMATOCRIT: 28 % — AB (ref 36.0–46.0)
Hemoglobin: 9.5 g/dL — ABNORMAL LOW (ref 12.0–15.0)
POTASSIUM: 4.4 mmol/L (ref 3.5–5.1)
SODIUM: 130 mmol/L — AB (ref 135–145)
TCO2: 23 mmol/L (ref 0–100)

## 2015-08-31 LAB — URINE MICROSCOPIC-ADD ON

## 2015-08-31 LAB — CBC WITH DIFFERENTIAL/PLATELET
BASOS ABS: 0 10*3/uL (ref 0.0–0.1)
BASOS PCT: 0 %
EOS ABS: 0.5 10*3/uL (ref 0.0–0.7)
EOS PCT: 6 %
HCT: 28.5 % — ABNORMAL LOW (ref 36.0–46.0)
HEMOGLOBIN: 9 g/dL — AB (ref 12.0–15.0)
Lymphocytes Relative: 15 %
Lymphs Abs: 1.1 10*3/uL (ref 0.7–4.0)
MCH: 28.3 pg (ref 26.0–34.0)
MCHC: 31.6 g/dL (ref 30.0–36.0)
MCV: 89.6 fL (ref 78.0–100.0)
Monocytes Absolute: 0.7 10*3/uL (ref 0.1–1.0)
Monocytes Relative: 10 %
NEUTROS PCT: 69 %
Neutro Abs: 5.2 10*3/uL (ref 1.7–7.7)
PLATELETS: 251 10*3/uL (ref 150–400)
RBC: 3.18 MIL/uL — AB (ref 3.87–5.11)
RDW: 14.7 % (ref 11.5–15.5)
WBC: 7.5 10*3/uL (ref 4.0–10.5)

## 2015-08-31 LAB — HEPATIC FUNCTION PANEL
ALBUMIN: 2.2 g/dL — AB (ref 3.5–5.0)
ALK PHOS: 45 U/L (ref 38–126)
ALT: 12 U/L — AB (ref 14–54)
AST: 24 U/L (ref 15–41)
Bilirubin, Direct: <0.1 mg/dL — ABNORMAL LOW (ref 0.1–0.5)
TOTAL PROTEIN: 5.9 g/dL — AB (ref 6.5–8.1)
Total Bilirubin: 0.2 mg/dL — ABNORMAL LOW (ref 0.3–1.2)

## 2015-08-31 LAB — URINALYSIS, ROUTINE W REFLEX MICROSCOPIC
Bilirubin Urine: NEGATIVE
Glucose, UA: NEGATIVE mg/dL
Ketones, ur: NEGATIVE mg/dL
NITRITE: NEGATIVE
PROTEIN: 30 mg/dL — AB
SPECIFIC GRAVITY, URINE: 1.01 (ref 1.005–1.030)
UROBILINOGEN UA: 0.2 mg/dL (ref 0.0–1.0)
pH: 6 (ref 5.0–8.0)

## 2015-08-31 LAB — CREATININE, SERUM
Creatinine, Ser: 2.57 mg/dL — ABNORMAL HIGH (ref 0.44–1.00)
GFR calc non Af Amer: 15 mL/min — ABNORMAL LOW (ref >60)
GFR, EST AFRICAN AMERICAN: 17 mL/min — AB (ref >60)

## 2015-08-31 LAB — LIPASE, BLOOD: Lipase: 39 U/L (ref 22–51)

## 2015-08-31 MED ORDER — CETYLPYRIDINIUM CHLORIDE 0.05 % MT LIQD
7.0000 mL | Freq: Two times a day (BID) | OROMUCOSAL | Status: DC
  Administered 2015-09-01 – 2015-09-02 (×3): 7 mL via OROMUCOSAL

## 2015-08-31 MED ORDER — CHLORHEXIDINE GLUCONATE 0.12 % MT SOLN
15.0000 mL | Freq: Two times a day (BID) | OROMUCOSAL | Status: DC
  Administered 2015-08-31 – 2015-09-02 (×4): 15 mL via OROMUCOSAL
  Filled 2015-08-31 (×5): qty 15

## 2015-08-31 MED ORDER — ASPIRIN EC 81 MG PO TBEC
81.0000 mg | DELAYED_RELEASE_TABLET | Freq: Every day | ORAL | Status: DC
  Administered 2015-08-31 – 2015-09-02 (×3): 81 mg via ORAL
  Filled 2015-08-31 (×3): qty 1

## 2015-08-31 MED ORDER — PIPERACILLIN-TAZOBACTAM IN DEX 2-0.25 GM/50ML IV SOLN
2.2500 g | Freq: Three times a day (TID) | INTRAVENOUS | Status: DC
  Administered 2015-08-31 – 2015-09-03 (×8): 2.25 g via INTRAVENOUS
  Filled 2015-08-31 (×10): qty 50

## 2015-08-31 MED ORDER — SODIUM CHLORIDE 0.9 % IV BOLUS (SEPSIS): 1000.0000 mL | Freq: Once | INTRAVENOUS | Status: DC

## 2015-08-31 MED ORDER — ENSURE ENLIVE PO LIQD
237.0000 mL | Freq: Two times a day (BID) | ORAL | Status: DC
  Administered 2015-08-31 – 2015-09-02 (×5): 237 mL via ORAL

## 2015-08-31 MED ORDER — ADULT MULTIVITAMIN W/MINERALS CH
1.0000 | ORAL_TABLET | Freq: Every day | ORAL | Status: DC
  Administered 2015-08-31 – 2015-09-02 (×3): 1 via ORAL
  Filled 2015-08-31 (×3): qty 1

## 2015-08-31 MED ORDER — OXYCODONE-ACETAMINOPHEN 5-325 MG PO TABS
1.0000 | ORAL_TABLET | Freq: Four times a day (QID) | ORAL | Status: DC | PRN
  Administered 2015-09-01: 1 via ORAL
  Filled 2015-08-31: qty 1

## 2015-08-31 MED ORDER — SODIUM CHLORIDE 0.9 % IJ SOLN
3.0000 mL | Freq: Two times a day (BID) | INTRAMUSCULAR | Status: DC
  Administered 2015-08-31 – 2015-09-05 (×4): 3 mL via INTRAVENOUS

## 2015-08-31 MED ORDER — OXYCODONE-ACETAMINOPHEN 5-325 MG PO TABS: 1.0000 | ORAL_TABLET | Freq: Four times a day (QID) | ORAL | Status: DC | PRN

## 2015-08-31 MED ORDER — PIPERACILLIN-TAZOBACTAM 3.375 G IVPB 30 MIN
3.3750 g | Freq: Once | INTRAVENOUS | Status: AC
  Administered 2015-08-31: 3.375 g via INTRAVENOUS
  Filled 2015-08-31: qty 50

## 2015-08-31 MED ORDER — VANCOMYCIN HCL IN DEXTROSE 1-5 GM/200ML-% IV SOLN
1000.0000 mg | INTRAVENOUS | Status: DC
Start: 2015-09-02 — End: 2015-09-02
  Filled 2015-08-31: qty 200

## 2015-08-31 MED ORDER — VANCOMYCIN HCL IN DEXTROSE 1-5 GM/200ML-% IV SOLN
1000.0000 mg | Freq: Once | INTRAVENOUS | Status: AC
  Administered 2015-08-31: 1000 mg via INTRAVENOUS
  Filled 2015-08-31: qty 200

## 2015-08-31 MED ORDER — HEPARIN SODIUM (PORCINE) 5000 UNIT/ML IJ SOLN
5000.0000 [IU] | Freq: Two times a day (BID) | INTRAMUSCULAR | Status: DC
  Administered 2015-08-31 – 2015-09-02 (×5): 5000 [IU] via SUBCUTANEOUS
  Filled 2015-08-31 (×6): qty 1

## 2015-08-31 MED ORDER — PANTOPRAZOLE SODIUM 20 MG PO TBEC
20.0000 mg | DELAYED_RELEASE_TABLET | Freq: Every day | ORAL | Status: DC
  Administered 2015-08-31 – 2015-09-01 (×2): 20 mg via ORAL
  Filled 2015-08-31 (×3): qty 1

## 2015-08-31 NOTE — ED Provider Notes (Signed)
CSN: [id number]     Arrival date & time 09/03/2015  0501 History   First MD Initiated Contact with Patient 09/24/2015 0510     Chief Complaint  Patient presents with  . Fever     (Consider location/radiation/quality/duration/timing/severity/associated sxs/prior Treatment) Patient is a 81 y.o. female presenting with fever. The history is provided by the EMS personnel and medical records. The history is limited by the condition of the patient. No language interpreter was used.  Fever Temp source:  Oral Severity:  Moderate Onset quality:  Sudden Timing:  Constant Progression:  Partially resolved Chronicity:  New Relieved by:  Acetaminophen Worsened by:  Nothing tried Ineffective treatments:  None tried Associated symptoms: no rash, no rhinorrhea and no somnolence   Risk factors: no recent travel     Past Medical History  Diagnosis Date  . Prolapsed bladder   . Rectal prolapse   . Hyperlipidemia   . Chronic kidney disease (CKD), stage III (moderate) 06/27/2012  . Bladder prolapse, female, acquired 06/27/2012  . Prolapsed internal hemorrhoids 06/27/2012  . Unspecified vitamin D deficiency   . Alzheimer's disease   . Anemia   . Hydronephrosis   . Personal history of fall   . Rectal polyp, excised 11/22/2012 01/01/2013   Past Surgical History  Procedure Laterality Date  . Appendectomy    . Joint replacement      lt hip  . Cataract extraction w/ intraocular lens  implant, bilateral    . Colonoscopy  11/22/2012    Procedure: COLONOSCOPY;  Surgeon: Shann Medal, MD;  Location: [facility];  Service: General;  Laterality: N/A;  . Polypectomy  11/22/2012    Procedure: POLYPECTOMY;  Surgeon: Shann Medal, MD;  Location: [facility];  Service: General;;  . Bilateral wrist surgeries     Family History  Problem Relation Age of Onset  . Cancer Sister     breast  . Heart attack Father    Social History  Substance Use Topics  . Smoking status: Never Smoker   . Smokeless tobacco: Never Used   . Alcohol Use: No   OB History    No data available     Review of Systems  Unable to perform ROS Constitutional: Positive for fever.  HENT: Negative for rhinorrhea.   Skin: Negative for rash.      Allergies  Review of patient's allergies indicates no known allergies.  Home Medications   Prior to Admission medications   Medication Sig Start Date End Date Taking? Authorizing Provider  aspirin EC 81 MG tablet Take 81 mg by mouth daily.    Historical Provider, MD  calcium carbonate (OS-CAL - DOSED IN MG OF ELEMENTAL CALCIUM) 1250 (500 CA) MG tablet Take 1 tablet by mouth daily.    Historical Provider, MD  docusate sodium (COLACE) 100 MG capsule Take 100 mg by mouth daily.    Historical Provider, MD  feeding supplement, ENSURE ENLIVE, (ENSURE ENLIVE) LIQD Take 237 mLs by mouth 2 (two) times daily between meals. 08/08/15   Venetia Maxon Rama, MD  Multiple Vitamin (MULTIVITAMIN WITH MINERALS) TABS Take 1 tablet by mouth daily.    Historical Provider, MD  polyethylene glycol (MIRALAX / GLYCOLAX) packet Take 17 g by mouth daily.    Historical Provider, MD  simvastatin (ZOCOR) 5 MG tablet Take 5 mg by mouth at bedtime.    Historical Provider, MD   BP 98/53 mmHg  Pulse 79  Temp(Src) 99.6 F (37.6 C) (Rectal)  Resp 16  SpO2 96%  Physical Exam  Constitutional: She appears well-developed and well-nourished. No distress.  HENT:  Head: Normocephalic and atraumatic.  Mouth/Throat: Oropharynx is clear and moist.  Eyes: Conjunctivae and EOM are normal. Pupils are equal, round, and reactive to light.  Neck: Normal range of motion. Neck supple.  Cardiovascular: Normal rate, regular rhythm and intact distal pulses.   Pulmonary/Chest: Effort normal and breath sounds normal. No stridor. No respiratory distress. She has no wheezes. She has no rales.  Abdominal: Soft. There is no tenderness. There is no rebound and no guarding.  Gassy throughout  Musculoskeletal: Normal range of motion. She  exhibits no edema.  Lymphadenopathy:    She has no cervical adenopathy.  Neurological: She is alert. She has normal reflexes.  Skin: Skin is warm and dry.  Psychiatric: She has a normal mood and affect.    ED Course  Procedures (including critical care time) Labs Review Labs Reviewed  CBC WITH DIFFERENTIAL/PLATELET  HEPATIC FUNCTION PANEL  LIPASE, BLOOD  URINALYSIS, ROUTINE W REFLEX MICROSCOPIC (NOT AT [facility])  I-STAT CHEM 8, ED    Imaging Review No results found. I have personally reviewed and evaluated these images and lab results as part of my medical decision-making.   EKG Interpretation None      MDM   Final diagnoses:  None    Medications  vancomycin (VANCOCIN) IVPB 1000 mg/200 mL premix (not administered)  piperacillin-tazobactam (ZOSYN) IVPB 3.375 g (not administered)  sodium chloride 0.9 % bolus 1,000 mL (not administered)   Results for orders placed or performed during the hospital encounter of 08/30/2015  CBC with Differential/Platelet  Result Value Ref Range   WBC 7.5 4.0 - 10.5 K/uL   RBC 3.18 (L) 3.87 - 5.11 MIL/uL   Hemoglobin 9.0 (L) 12.0 - 15.0 g/dL   HCT 28.5 (L) 36.0 - 46.0 %   MCV 89.6 78.0 - 100.0 fL   MCH 28.3 26.0 - 34.0 pg   MCHC 31.6 30.0 - 36.0 g/dL   RDW 14.7 11.5 - 15.5 %   Platelets 251 150 - 400 K/uL   Neutrophils Relative % 69 %   Neutro Abs 5.2 1.7 - 7.7 K/uL   Lymphocytes Relative 15 %   Lymphs Abs 1.1 0.7 - 4.0 K/uL   Monocytes Relative 10 %   Monocytes Absolute 0.7 0.1 - 1.0 K/uL   Eosinophils Relative 6 %   Eosinophils Absolute 0.5 0.0 - 0.7 K/uL   Basophils Relative 0 %   Basophils Absolute 0.0 0.0 - 0.1 K/uL  I-Stat Chem 8, ED  Result Value Ref Range   Sodium 130 (L) 135 - 145 mmol/L   Potassium 4.4 3.5 - 5.1 mmol/L   Chloride 97 (L) 101 - 111 mmol/L   BUN 38 (H) 6 - 20 mg/dL   Creatinine, Ser 2.60 (H) 0.44 - 1.00 mg/dL   Glucose, Bld 101 (H) 65 - 99 mg/dL   Calcium, Ion 1.23 1.13 - 1.30 mmol/L   TCO2 23 0 - 100  mmol/L   Hemoglobin 9.5 (L) 12.0 - 15.0 g/dL   HCT 28.0 (L) 36.0 - 46.0 %   Dg Chest 2 View  08/20/2015   CLINICAL DATA:  81 year old with progressive generalized weakness over the past few weeks and recent diagnosis of pneumonia.  EXAM: CHEST  2 VIEW  COMPARISON:  08/04/2015 and earlier.  FINDINGS: AP semi-erect and lateral images were obtained. Suboptimal inspiration. Cardiac silhouette upper normal in size to mildly enlarged for technique and degree of inspiration, unchanged. Thoracic  aorta mildly tortuous and atherosclerotic, unchanged. Airspace consolidation throughout both lungs, not significantly changed since the 08/04/2015 examination. No new pulmonary parenchymal abnormalities. Stable small bilateral pleural effusions. Exaggeration of the usual thoracic kyphosis.  IMPRESSION: Since the most recent chest x-ray 08/04/2015:  1. No significant change in the airspace consolidation throughout both lungs and the associated small bilateral pleural effusions. This likely reflects a combination of CHF and pneumonia. 2. No new abnormalities.   Electronically Signed   By: Evangeline Dakin M.D.   On: 08/20/2015 17:01   Dg Chest 2 View  08/04/2015   CLINICAL DATA:  Decreased oxygen saturation today.  EXAM: CHEST  2 VIEW  COMPARISON:  PA and lateral chest 10/17/2012.  FINDINGS: There is cardiomegaly and extensive bilateral airspace disease with an appearance most compatible with pulmonary edema. Small bilateral pleural effusions are identified. No pneumothorax.  IMPRESSION: Cardiomegaly and bilateral airspace disease likely due to pulmonary edema rather than pneumonia.   Electronically Signed   By: Inge Rise M.D.   On: 08/04/2015 14:36   Ct Chest Wo Contrast  08/20/2015   CLINICAL DATA:  Hypoxia.  Recently diagnosed with pneumonia.  EXAM: CT CHEST WITHOUT CONTRAST  TECHNIQUE: Multidetector CT imaging of the chest was performed following the standard protocol without IV contrast.  COMPARISON:  Chest  radiographs obtained earlier today.  FINDINGS: Small pericardial effusion with a maximum thickness of 8 mm. Small left pleural effusion and minimal right pleural effusion.  Patchy interstitial and alveolar opacities in both lower lobes, lingula, inferior left upper lobe, right middle lobe and minimally in the inferior right upper lobe. Cylindrical bronchiectasis in these areas, specially in the lower lobes. Diffuse peribronchial thickening. Mild bullous changes in the lateral aspect of the right middle lobe. No enlarged lymph nodes.  Diffuse low density of the blood relative to the arterial walls, corresponding to anemia on the patient's recent labs. Atheromatous arterial calcifications, including the coronary arteries.  Calcified granulomata in the included portion of the liver. Low-density of the included portion of the upper pole of the right kidney, relative to the left kidney. There was cortical thinning with chronic severe hydronephrosis on the right on the ultrasound dated 11/07/2012. Thoracic spine degenerative changes, including changes of DISH.  IMPRESSION: 1. Bilateral multilobar pneumonia and/or chronic changes due to bronchiectasis. 2. Chronic bronchitic changes and mild changes of COPD. 3. Atheromatous arterial calcifications, including the coronary arteries. 4. Anemia. 5. Chronic right hydronephrosis with cortical thinning, most likely explaining low density of the included portion of the upper pole of the right kidney. 6. Small pericardial effusion, small left pleural effusion and minimal right pleural effusion.   Electronically Signed   By: Claudie Revering M.D.   On: 08/20/2015 19:05   Dg Chest Port 1 View  08/23/2015   CLINICAL DATA:  Shortness of breath, cough  EXAM: PORTABLE CHEST 1 VIEW  COMPARISON:  08/20/2015  FINDINGS: Bilateral airspace disease again noted, diffuse on the left and most confluent in the right lower lobe. This is unchanged since prior study. No definite effusions. Heart is  borderline enlarged. No acute bony abnormality.  IMPRESSION: Stable bilateral airspace disease, left greater than right. This could represent asymmetric edema or infection.   Electronically Signed   By: Rolm Baptise M.D.   On: 08/23/2015 11:54   Dg Abd Acute W/chest  09/27/2015   CLINICAL DATA:  Acute onset of fever. Recently diagnosed pneumonia. Initial encounter.  EXAM: DG ABDOMEN ACUTE W/ 1V CHEST  COMPARISON:  Chest radiograph performed 08/23/2015  FINDINGS: Worsening bilateral airspace opacification is noted, likely reflecting worsening pneumonia. No definite pleural effusion or pneumothorax is seen.  The cardiomediastinal silhouette is enlarged. No acute osseous abnormalities are identified.  The visualized bowel gas pattern is grossly unremarkable, with scattered air filled loops of small and large bowel. Scattered vascular calcifications are seen. No free intra-abdominal air is identified on the provided decubitus view.  Calcifications at the pelvis may reflect calcified fibroids. The patient's left hip arthroplasty is grossly unremarkable, though incompletely assessed. No acute osseous abnormalities are seen.  IMPRESSION: 1. Worsening bilateral airspace opacification likely reflects worsening pneumonia. 2. Cardiomegaly noted. 3. Unremarkable bowel gas pattern; no free intra-abdominal air seen.   Electronically Signed   By: Garald Balding M.D.   On: 09/18/2015 05:54    Will admit to tele per Dr. Carilyn Goodpasture manager aware is a carry over til am    Arav Bannister, MD 09/14/2015 [phone]

## 2015-08-31 NOTE — ED Notes (Signed)
Bed: EC95 Expected date:  Expected time:  Means of arrival:  Comments: EMS fever, recent Pneumonia

## 2015-08-31 NOTE — Progress Notes (Addendum)
[place] NOTE  Pharmacy Consult for Vancomycin & Zosyn Indication: pneumonia  No Known Allergies  Patient Measurements:    Vital Signs: Temp: 99.6 F (37.6 C) (10/03 0503) Temp Source: Rectal (10/03 0503) BP: 98/53 mmHg (10/03 0503) Pulse Rate: 79 (10/03 0503) Intake/Output from previous day:   Intake/Output from this shift: Total I/O In: 240 [P.O.:240] Out: -   Labs:  Recent Labs  09/21/2015 0552 09/05/2015 0558  WBC 7.5  --   HGB 9.0* 9.5*  PLT 251  --   CREATININE  --  2.60*   Estimated Creatinine Clearance: 11.4 mL/min (by C-G formula based on Cr of 2.6). No results for input(s): VANCOTROUGH, VANCOPEAK, VANCORANDOM, GENTTROUGH, GENTPEAK, GENTRANDOM, TOBRATROUGH, TOBRAPEAK, TOBRARND, AMIKACINPEAK, AMIKACINTROU, AMIKACIN in the last 72 hours.   Microbiology: Recent Results (from the past 720 hour(s))  Culture, blood (routine x 2)     Status: None   Collection Time: 08/04/15 12:55 PM  Result Value Ref Range Status   Specimen Description BLOOD RIGHT ANTECUBITAL  Final   Special Requests BOTTLES DRAWN AEROBIC AND ANAEROBIC 10ML  Final   Culture   Final    NO GROWTH 5 DAYS Performed at [facility]    Report Status 08/09/2015 FINAL  Final  Culture, blood (routine x 2)     Status: None   Collection Time: 08/04/15  1:26 PM  Result Value Ref Range Status   Specimen Description BLOOD LEFT ANTECUBITAL  Final   Special Requests BOTTLES DRAWN AEROBIC AND ANAEROBIC 5ML  Final   Culture   Final    NO GROWTH 5 DAYS Performed at [facility]    Report Status 08/09/2015 FINAL  Final  MRSA PCR Screening     Status: None   Collection Time: 08/21/15  3:27 AM  Result Value Ref Range Status   MRSA by PCR NEGATIVE NEGATIVE Final    Comment:        The GeneXpert MRSA Assay (FDA approved for NASAL specimens only), is one component of a comprehensive MRSA colonization surveillance program. It is not intended to diagnose MRSA infection nor to guide  or monitor treatment for MRSA infections.     Anti-infectives    Start     Dose/Rate Route Frequency Ordered Stop   09/15/2015 0615  vancomycin (VANCOCIN) IVPB 1000 mg/200 mL premix     1,000 mg 200 mL/hr over 60 Minutes Intravenous  Once 09/04/2015 0602     09/06/2015 0615  piperacillin-tazobactam (ZOSYN) IVPB 3.375 g     3.375 g 100 mL/hr over 30 Minutes Intravenous  Once 09/15/2015 0602        Assessment: 81 yo F admitted from SNF with PNA (recent hospitalization 9/22-9/28/16).  Reported fever of 100.72F at nursing facility (Tc 99.6) and cough.  CXR + worsening bilateral infiltrates. Pharmacy asked to initiate Vancomycin & Zosyn for HCAP.  Initial doses given in ED. Known CKD (Scr at patient's baseline).  10/3 >>Vanc >> 10/3 >> Zosyn  >>    10/3 blood: IP  Dose changes/levels:  Goal of Therapy:  Vancomycin trough level 15-20 mcg/ml  Plan:  Zosyn 2.25gm IV q8h Vancomycin 1gm IV q48h Check Vancomycin trough at steady state Monitor renal function and cx data   Biagio Borg 09/01/2015,9:46 AM

## 2015-08-31 NOTE — Evaluation (Signed)
Physical Therapy Evaluation Patient Details Name: [name] MRN: [id number] DOB: 1919-03-16 Today's Date: 09/26/2015   History of Present Illness  81 year old female adm with worsening HCAP; past medical history of mild dementia, severe bladder prolapse, stage IV chronic kidney disease and recent admission for pneumonia and CHF   Clinical Impression  Pt admitted with above diagnosis. Pt currently with functional limitations due to the deficits listed below (see PT Problem List).  Pt will benefit from skilled PT to increase their independence and safety with mobility to allow discharge to the venue listed below.  Pt was "walking some" at rehab prior to this adm per her report  Pt cooperative only with sitting EOB today, declines OOB or standing d/t pain; RN aware; attempted to place pt in sidelying per RN  (present and told us to do so) however pt reported it was "too much" and pillow removed at pt request;     Follow Up Recommendations SNF;Supervision/Assistance - 24 hour    Equipment Recommendations  None recommended by PT    Recommendations for Other Services       Precautions / Restrictions Precautions Precautions: Fall Precaution Comments: monitor sats/DOE      Mobility  Bed Mobility Overal bed mobility: Needs Assistance Bed Mobility: Rolling;Sidelying to Sit;Sit to Supine Rolling: Mod assist;Min assist Sidelying to sit: Min assist;Min guard   Sit to supine: Mod assist   General bed mobility comments: assist with LEs onto bed, min/guard to bring trunk to full upright, incr time to complete  Transfers                 General transfer comment: pt refused  Ambulation/Gait    NT-pt declined            Stairs            Wheelchair Mobility    Modified Rankin (Stroke Patients Only)       Balance     Sitting balance-Leahy Scale: Fair (posterior LOB)                                       Pertinent Vitals/Pain Pain  Assessment: Faces Faces Pain Scale: Hurts whole lot Pain Location: peri-area Pain Descriptors / Indicators: Constant Pain Intervention(s): Limited activity within patient's tolerance    Home Living Family/patient expects to be discharged to:: Skilled nursing facility Living Arrangements: Other (Comment)                    Prior Function                 Hand Dominance        Extremity/Trunk Assessment   Upper Extremity Assessment: Generalized weakness           Lower Extremity Assessment: Generalized weakness         Communication      Cognition Arousal/Alertness: Awake/alert Behavior During Therapy: WFL for tasks assessed/performed Overall Cognitive Status: History of cognitive impairments - at baseline                      General Comments      Exercises        Assessment/Plan    PT Assessment Patient needs continued PT services  PT Diagnosis Difficulty walking;Generalized weakness   PT Problem List Decreased strength;Decreased activity tolerance;Decreased mobility;Decreased balance;Cardiopulmonary status limiting activity;Decreased knowledge of precautions;Decreased safety awareness;Decreased  knowledge of use of DME  PT Treatment Interventions DME instruction;Gait training;Functional mobility training;Therapeutic activities;Patient/family education;Therapeutic exercise   PT Goals (Current goals can be found in the Care Plan section) Acute Rehab PT Goals Patient Stated Goal: pt does not state; per son--back to SNF PT Goal Formulation: With patient/family Time For Goal Achievement: 2015/10/01 Potential to Achieve Goals: Good    Frequency Min 3X/week   Barriers to discharge        Co-evaluation               End of Session   Activity Tolerance: Patient limited by pain Patient left: in bed;with call bell/phone within reach;with bed alarm set Nurse Communication: Mobility status         Time: 9211-9417 PT Time  Calculation (min) (ACUTE ONLY): 13 min   Charges:   PT Evaluation $Initial PT Evaluation Tier I: 1 Procedure     PT G CodesKenyon [name] September 24, 2015, 3:55 PM

## 2015-08-31 NOTE — Telephone Encounter (Addendum)
On 08/20/2015 a nurse from [facility] returned a message that I left on a phone on one of the floors, I informed her that I was calling to speak with the nurse for this patient when I left the message on the phone. It was in regards to a message about a chest  x-ray that we never received and the patient's saturation's being low. I informed her that the message to send the patient to the ED,was given to someone else at that facility as well. She stated that she would make sure that she is sent to the ED.

## 2015-08-31 NOTE — ED Notes (Addendum)
Patient from skilled nursing facility sent out for a temperature of 100.5. Patient was given 650mg  of tylenol by facility prior to transport. Patient was treated for pneumonia on 08/23/15. Patient denies pain at this time.

## 2015-08-31 NOTE — Progress Notes (Signed)
Speech Language Pathology  Patient Details Name: [name] MRN: [id number] DOB: 05-08-1919 Today's Date: 09/27/2015 Time:  -     Order received for swallow eval. Will initiate 10/4.     Orbie Pyo Lannon.Ed [employer] [phone]

## 2015-08-31 NOTE — H&P (Signed)
[facility] History and Physical  [name] XFG:[id number] DOB: [birth year] DOA: 08/29/2015  Referring physician:  PCP: Gildardo Cranker, DO  Specialists:   Chief Complaint: fever   HPI: [name] is a 81 y.o. female with PMH of Dementia, CHF, CKD, Chronic Bladder Prolapse, who was recently treated for pneumonia (9/22-9/28) presented from SNF with fever. Patient reports feeling fatigue associated with mild cough, fever for few days. Her son reports chronic mild cough with possible aspiration. She found to have mild hypoxia at SNF then referred to ED for further evaluation. Patient denies chest pains, no acute abdominal pains, but had mild nausea, no vomiting no diarrhea -ED: CXR: showed worsening BL infiltrates. hospitalist is called for evaluation    Review of Systems: The patient denies anorexia, weight loss,, vision loss, decreased hearing, hoarseness, chest pain, syncope, dyspnea on exertion, peripheral edema, balance deficits, hemoptysis, abdominal pain, melena, hematochezia, severe indigestion/heartburn, hematuria, incontinence, genital sores, muscle weakness, suspicious skin lesions, transient blindness, difficulty walking, depression, unusual weight change, abnormal bleeding, enlarged lymph nodes, angioedema, and breast masses.    Past Medical History  Diagnosis Date  . Prolapsed bladder   . Rectal prolapse   . Hyperlipidemia   . Chronic kidney disease (CKD), stage III (moderate) 06/27/2012  . Bladder prolapse, female, acquired 06/27/2012  . Prolapsed internal hemorrhoids 06/27/2012  . Unspecified vitamin D deficiency   . Alzheimer's disease   . Anemia   . Hydronephrosis   . Personal history of fall   . Rectal polyp, excised 11/22/2012 01/01/2013   Past Surgical History  Procedure Laterality Date  . Appendectomy    . Joint replacement      lt hip  . Cataract extraction w/ intraocular lens  implant, bilateral    . Colonoscopy  11/22/2012    Procedure: COLONOSCOPY;   Surgeon: Shann Medal, MD;  Location: [facility];  Service: General;  Laterality: N/A;  . Polypectomy  11/22/2012    Procedure: POLYPECTOMY;  Surgeon: Shann Medal, MD;  Location: [facility];  Service: General;;  . Bilateral wrist surgeries     Social History:  reports that she has never smoked. She has never used smokeless tobacco. She reports that she does not drink alcohol or use illicit drugs. SNF;  where does patient live--home, ALF, SNF? and with whom if at home? No; Can patient participate in ADLs?  No Known Allergies  Family History  Problem Relation Age of Onset  . Cancer Sister     breast  . Heart attack Father      (be sure to complete)  Prior to Admission medications   Medication Sig Start Date End Date Taking? Authorizing Provider  aspirin EC 81 MG tablet Take 81 mg by mouth daily.   Yes Historical Provider, MD  calcium carbonate (OS-CAL - DOSED IN MG OF ELEMENTAL CALCIUM) 1250 (500 CA) MG tablet Take 1 tablet by mouth daily.   Yes Historical Provider, MD  docusate sodium (COLACE) 100 MG capsule Take 100 mg by mouth daily.   Yes Historical Provider, MD  feeding supplement, ENSURE ENLIVE, (ENSURE ENLIVE) LIQD Take 237 mLs by mouth 2 (two) times daily between meals. 08/08/15  Yes Venetia Maxon Rama, MD  Multiple Vitamin (MULTIVITAMIN WITH MINERALS) TABS Take 1 tablet by mouth daily.   Yes Historical Provider, MD   Physical Exam: Filed Vitals:   09/19/2015 0503  BP: 98/53  Pulse: 79  Temp: 99.6 F (37.6 C)  Resp:      General:  Alert. Confused to time   Eyes: eom-i, perrla   ENT: no oral ulcers   Neck: supple, no JVD   Cardiovascular: s1,s2 rrr  Respiratory: BL LL rales   Abdomen: soft, nt,nd   Skin: no rash   Musculoskeletal: no leg edema   Psychiatric: no hallucinations   Neurologic: CN 2-12 intact   Labs on Admission:  Basic Metabolic Panel:  Recent Labs Lab 08/25/15 1049 08/26/15 0544 09/16/2015 0558  NA 134* 134* 130*  K 4.2 4.3 4.4  CL 98* 99*  97*  CO2 29 28  --   GLUCOSE 129* 91 101*  BUN 35* 34* 38*  CREATININE 2.71* 2.60* 2.60*  CALCIUM 8.9 8.8*  --    Liver Function Tests:  Recent Labs Lab 09/24/2015 0552  AST 24  ALT 12*  ALKPHOS 45  BILITOT 0.2*  PROT 5.9*  ALBUMIN 2.2*    Recent Labs Lab 09/12/2015 0552  LIPASE 39   No results for input(s): AMMONIA in the last 168 hours. CBC:  Recent Labs Lab 08/25/15 1051 09/09/2015 0552 09/18/2015 0558  WBC 6.7 7.5  --   NEUTROABS  --  5.2  --   HGB 10.1* 9.0* 9.5*  HCT 32.4* 28.5* 28.0*  MCV 92.6 89.6  --   PLT 299 251  --    Cardiac Enzymes:  Recent Labs Lab 08/25/15 1049  TROPONINI 0.05*    BNP (last 3 results)  Recent Labs  08/04/15 1316 08/20/15 1511  BNP 101.3* 135.9*    ProBNP (last 3 results) No results for input(s): PROBNP in the last 8760 hours.  CBG: No results for input(s): GLUCAP in the last 168 hours.  Radiological Exams on Admission: Dg Abd Acute W/chest  09/17/2015   CLINICAL DATA:  Acute onset of fever. Recently diagnosed pneumonia. Initial encounter.  EXAM: DG ABDOMEN ACUTE W/ 1V CHEST  COMPARISON:  Chest radiograph performed 08/23/2015  FINDINGS: Worsening bilateral airspace opacification is noted, likely reflecting worsening pneumonia. No definite pleural effusion or pneumothorax is seen.  The cardiomediastinal silhouette is enlarged. No acute osseous abnormalities are identified.  The visualized bowel gas pattern is grossly unremarkable, with scattered air filled loops of small and large bowel. Scattered vascular calcifications are seen. No free intra-abdominal air is identified on the provided decubitus view.  Calcifications at the pelvis may reflect calcified fibroids. The patient's left hip arthroplasty is grossly unremarkable, though incompletely assessed. No acute osseous abnormalities are seen.  IMPRESSION: 1. Worsening bilateral airspace opacification likely reflects worsening pneumonia. 2. Cardiomegaly noted. 3. Unremarkable  bowel gas pattern; no free intra-abdominal air seen.   Electronically Signed   By: Garald Balding M.D.   On: 08/29/2015 05:54    EKG: Independently reviewed.   Assessment/Plan Active Problems:   Bladder prolapse, female, acquired   Alzheimer's disease   HCAP (healthcare-associated pneumonia)   81 y/o female with PMH of Dementia, CHF, CKD, Chronic Bladder Prolapse, who was recently treated for pneumonia (9/22-9/28) presented from SNF with fever and found to have recurrent pneumonia  -admitted with HCAP   1. HCAP. Possible aspiration. Vs recurrent pneumonia due to bronchiectasis -start empiric IV atx, pend blood cultures. Obtain sputum cultures. Obtain speech eval   2. CHF. Chronic diastolic HF. Echo (07/2015): LVEF 70%. Patient is not on diuretics at home. Clinically euvolemic. Cont monitor. Diuresis as needed. 3. Chronic bladder prolapse. Patient was refusing pessary. We will discuss further to f/u with GYN if patient agrees  4. GERD. Start PPI.  5. Dementia. Stable  D/w patient, confirmed with his son. Patient is DNR   None.  if consultant consulted, please document name and whether formally or informally consulted  Code Status: DNR (must indicate code status--if unknown or must be presumed, indicate so) Family Communication: d/w patient, his son  (indicate person spoken with, if applicable, with phone number if by telephone) Disposition Plan: pend clinical improvement  (indicate anticipated LOS)  Time spent: >45 minutes   Kinnie Feil [facility] Pager [phone]  If 7PM-7AM, please contact night-coverage [email or web address] Password [email or web address] 09/28/2015, 7:58 AM

## 2015-08-31 NOTE — Clinical Social Work Note (Signed)
Clinical Social Work Assessment  Patient Details  Name: [name] MRN: [id number] Date of Birth: Mar 03, 1919  Date of referral:  09/09/2015               Reason for consult:  Facility Placement                Permission sought to share information with:  Family Supports Permission granted to share information::  Yes, Verbal Permission Granted  Name::     [name]  Agency::     Relationship::  Son  Sport and exercise psychologist Information:     Housing/Transportation Living arrangements for the past 2 months:  [place] of Information:  Patient, Adult Children Patient Interpreter Needed:  None Criminal Activity/Legal Involvement Pertinent to Current Situation/Hospitalization:  No - Comment as needed Significant Relationships:  Adult Children Lives with:  Facility Resident Do you feel safe going back to the place where you live?  Yes Need for family participation in patient care:  Yes (Comment)  Care giving concerns:  Return to [facility] when medically ready   Social Worker assessment / plan:  Patient and son want patient to return to [facility], facility agreeable to patient return.  Son concerned about patient's bladder issues, SNF working to address.  Also voiced concern about ongoing issues w swallowing and wonders about aspiration problems.    Employment status:  Retired Forensic scientist:  Medicare PT Recommendations:  [place] / Referral to community resources:  Acute Rehab  Patient/Family's Response to care: Patient frustrated about inability to return to prior level of function, "worn out" from PT, son supportive but feels patient discounts his opinion.   Patient/Family's Understanding of and Emotional Response to Diagnosis, Current Treatment, and Prognosis:  Patient and family aware of need for return to SNF, like current placement at [facility].  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  Appears accepting of current physical limitations but wants to regain strength.  Worries she may have to return to SNF "too soon."    Emotional Assessment Appearance:  Appears stated age Attitude/Demeanor/Rapport:   (pleasant, interactive) Affect (typically observed):  Calm, Pleasant Orientation:  Oriented to Self, Oriented to Place, Oriented to Situation Alcohol / Substance use:  Never Used Psych involvement (Current and /or in the community):  No (Comment)  Discharge Needs  Concerns to be addressed:  Discharge Planning Concerns Readmission within the last 30 days:  Yes Current discharge risk:  Physical Impairment Barriers to Discharge:  No Barriers Identified   Beverely Pace, LCSW 09/09/2015, 4:01 PM

## 2015-08-31 NOTE — Progress Notes (Addendum)
CSW spoke w [facility], pt can return to facility at discharge.    Edwyna Shell, LCSW Clinical Social Worker

## 2015-09-01 ENCOUNTER — Encounter (HOSPITAL_COMMUNITY): Payer: Self-pay

## 2015-09-01 LAB — IRON AND TIBC
IRON: 15 ug/dL — AB (ref 28–170)
Saturation Ratios: 8 % — ABNORMAL LOW (ref 10.4–31.8)
TIBC: 182 ug/dL — AB (ref 250–450)
UIBC: 167 ug/dL

## 2015-09-01 LAB — BASIC METABOLIC PANEL
Anion gap: 7 (ref 5–15)
BUN: 41 mg/dL — ABNORMAL HIGH (ref 6–20)
CALCIUM: 8.4 mg/dL — AB (ref 8.9–10.3)
CO2: 25 mmol/L (ref 22–32)
CREATININE: 2.63 mg/dL — AB (ref 0.44–1.00)
Chloride: 98 mmol/L — ABNORMAL LOW (ref 101–111)
GFR calc Af Amer: 17 mL/min — ABNORMAL LOW (ref >60)
GFR calc non Af Amer: 15 mL/min — ABNORMAL LOW (ref >60)
GLUCOSE: 100 mg/dL — AB (ref 65–99)
Potassium: 4.4 mmol/L (ref 3.5–5.1)
Sodium: 130 mmol/L — ABNORMAL LOW (ref 135–145)

## 2015-09-01 LAB — FERRITIN: Ferritin: 48 ng/mL (ref 11–307)

## 2015-09-01 LAB — CBC
HEMATOCRIT: 27.2 % — AB (ref 36.0–46.0)
HEMOGLOBIN: 8.8 g/dL — AB (ref 12.0–15.0)
MCH: 28.8 pg (ref 26.0–34.0)
MCHC: 32.4 g/dL (ref 30.0–36.0)
MCV: 88.9 fL (ref 78.0–100.0)
Platelets: 257 10*3/uL (ref 150–400)
RBC: 3.06 MIL/uL — ABNORMAL LOW (ref 3.87–5.11)
RDW: 14.7 % (ref 11.5–15.5)
WBC: 7.5 10*3/uL (ref 4.0–10.5)

## 2015-09-01 NOTE — Care Management Note (Signed)
Case Management Note  Patient Details  Name: [name] MRN: [id number] Date of Birth: 09-13-1919  Subjective/Objective:            Sepsis pna        Action/Plan:Date:  Oct. 04, 2016 U.R. performed for needs and level of care. Will continue to follow for Case Management needs.  Velva Harman, RN, BSN, [place]   [phone]   Expected Discharge Date:   (UNKNOWN)               Expected Discharge Plan:  Skilled Nursing Facility  In-House Referral:  Clinical Social Work  Discharge planning Services  CM Consult  Post Acute Care Choice:  NA Choice offered to:  NA  DME Arranged:    DME Agency:     HH Arranged:    [place] Agency:     Status of Service:  Completed, signed off  Medicare Important Message Given:    Date Medicare IM Given:    Medicare IM give by:    Date Additional Medicare IM Given:    Additional Medicare Important Message give by:     If discussed at [place] of Stay Meetings, dates discussed:    Additional Comments:  Leeroy Cha, RN 09/01/2015, 11:45 AM

## 2015-09-01 NOTE — Plan of Care (Addendum)
Patient really having difficult with pain d/t prolapses which increase breathing difficulties.  Family asking if hosp staff may consider assisting with prolapses while in hospital?  Dr. texted to Inform of family concerns.

## 2015-09-01 NOTE — Evaluation (Signed)
Clinical/Bedside Swallow Evaluation Patient Details  Name: [name] MRN: [id number] Date of Birth: [birth year]  Today's Date: 09/01/2015 Time: SLP Start Time (ACUTE ONLY): 2836 SLP Stop Time (ACUTE ONLY): 1125 SLP Time Calculation (min) (ACUTE ONLY): 36 min  Past Medical History:  Past Medical History  Diagnosis Date  . Prolapsed bladder   . Rectal prolapse   . Hyperlipidemia   . Bladder prolapse, female, acquired 06/27/2012  . Prolapsed internal hemorrhoids 06/27/2012  . Unspecified vitamin D deficiency   . Alzheimer's disease   . Anemia   . Personal history of fall   . Rectal polyp, excised 11/22/2012 01/01/2013  . Chronic kidney disease (CKD), stage III (moderate) 06/27/2012  . Hydronephrosis    Past Surgical History:  Past Surgical History  Procedure Laterality Date  . Appendectomy    . Cataract extraction w/ intraocular lens  implant, bilateral    . Colonoscopy  11/22/2012    Procedure: COLONOSCOPY;  Surgeon: Shann Medal, MD;  Location: [facility];  Service: General;  Laterality: N/A;  . Polypectomy  11/22/2012    Procedure: POLYPECTOMY;  Surgeon: Shann Medal, MD;  Location: [facility];  Service: General;;  . Bilateral wrist surgeries    . Joint replacement      lt hip   HPI:  81 yo female with known Alzheimer's dementia, who presented to [facility] with main concern of several days duration of progressive dyspnea that initially started with exertion and progressed to dyspnea at rest. Dx with acute respiratory failure with hypoxia  Swallow evaluation ordered to rule out aspiration- pt has been seen for multiple evaluations in the past.  Pt also has h/o anemia, bladder collapse.  CXR worsening airspace disease - reflecting worsening pna     Assessment / Plan / Recommendation Clinical Impression  Pt presents with negative CN exam but notably takes shallow rapid breaths and has frequent weak cough at baseline.  NO overt symptoms of aspiration with intake of water via straw or medicine  with water- despite sequential boluses consumed.  There were also no indication of residuals.  Pt  refused all other po - stating she was not hungry.  Son [name] reports pt has a poor appetite.     Pt has premorbid dysphagia per son - even prior to pna in September- resulting in occasional coughing with po intake.    SLP suspects swallow musculature largely intact and greatest risk factor for aspiration is likely her respiratory status.    Note pt also with premorbid use of fosomax and son's concern for this contributing to dysphagia.    Recommend continue diet with strict precautions- encouraged pt and son to order softer foods.    Provided son/pt with education to aspiration mitigation strategies and discussed option of MBS to rule out overt aspiration.    Also do not anticipate MBS will reveal significant findings and son at this time agreeable to forgo this test.  Will follow up briefly to assure education completed.  Thanks.     Aspiration Risk    moderate and ongoing   Diet Recommendation Age appropriate regular solids;Thin (pt ordering only soft foods per son and pt)   Medication Administration: Whole meds with liquid Compensations: Other (Comment) (drink liquids t/o meal, rest breaks if dyspneic)    Other  Recommendations Oral Care Recommendations: Oral care BID   Follow Up Recommendations       Frequency and Duration min 1 x/week  1 week   Pertinent Vitals/Pain Low grade  fever, decreased     Swallow Study Prior Functional Status   see hhx    General Date of Onset: 08/06/15 Other Pertinent Information: 81 yo female with known Alzheimer's dementia, who presented to [facility] with main concern of several days duration of progressive dyspnea that initially started with exertion and progressed to dyspnea at rest. Dx with acute respiratory failure with hypoxia  Swallow evaluation ordered to rule out aspiration- pt has been seen for multiple evaluations in the past.  Pt also has  h/o anemia, bladder collapse.  CXR worsening airspace disease - reflecting worsening pna   Type of Study: Bedside swallow evaluation Diet Prior to this Study: Regular;Thin liquids Temperature Spikes Noted: No Respiratory Status: Supplemental O2 delivered via (comment) History of Recent Intubation: No Behavior/Cognition: Alert;Cooperative;Pleasant mood Oral Cavity - Dentition: Other (Comment) (has no upper dentition -does not have dentures, lower dentition present xfew teeth) Self-Feeding Abilities: Able to feed self Patient Positioning: Upright in bed Baseline Vocal Quality: Normal;Low vocal intensity;Hoarse Volitional Cough: Weak Volitional Swallow: Able to elicit    Oral/Motor/Sensory Function Overall Oral Motor/Sensory Function: Appears within functional limits for tasks assessed   Ice Chips Ice chips: Not tested   Thin Liquid Thin Liquid: Within functional limits Presentation: Straw;Cup    Nectar Thick Nectar Thick Liquid: Not tested   Honey Thick Honey Thick Liquid: Not tested   Puree Puree: Not tested   Solid   GO    Solid: Not tested       Claudie Fisherman, [place] [facility] SLP [phone]

## 2015-09-01 NOTE — Progress Notes (Signed)
[facility] PROGRESS NOTE  [name] GHW:[id number] DOB: 01-21-1919 DOA: 09/25/2015 PCP: Gildardo Cranker, DO  Assessment/Plan: 81 y/o female with PMH of Dementia, CHF, CKD, Chronic Bladder Prolapse, who was recently treated for pneumonia (9/22-9/28) presented from SNF with fever and found to have recurrent pneumonia  -admitted with HCAP   1. HCAP. Possible aspiration. Vs recurrent pneumonia due to bronchiectasis. Patient is started empiric IV atx, pend blood cultures. Obtain sputum cultures. Cont IV atx. Monitor  2. CHF. Chronic diastolic HF. Echo (07/2015): LVEF 70%. Patient is not on diuretics at home. Clinically euvolemic. Cont monitor. Diuresis as needed. 3. Chronic bladder prolapse. Patient was refusing pessary. We will discuss further to f/u with GYN if patient agrees as outpatient follow up 4. GERD. Start PPI.  5. Dementia. Stable 6. CKD. Stable  7. Anemia. Stable no s/s of bleeding. Check iron profile   D/w patient, confirmed with his son. Patient is DNR   None. if consultant consulted, please document name and whether formally or informally consulted  Code Status: DNR (must indicate code status--if unknown or must be presumed, indicate so) Family Communication: d/w patient, his son (indicate person spoken with, if applicable, with phone number if by telephone) Disposition Plan: pend clinical improvement; PT (indicate anticipated LOS)  Consultants:  none  Procedures:  none  Antibiotics:  Zosyn 10/3>>>   vanc 10/3>>>  (indicate start date, and stop date if known)  HPI/Subjective: Alert. No distress. Reports mild cough   Objective: Filed Vitals:   09/01/15 0525  BP: 113/50  Pulse: 94  Temp: 97.8 F (36.6 C)  Resp: 16    Intake/Output Summary (Last 24 hours) at 09/01/15 0932 Last data filed at 09/01/15 0500  Gross per 24 hour  Intake    150 ml  Output      0 ml  Net    150 ml   There were no vitals filed for this  visit.  Exam:   General:  Alet, oriented    Cardiovascular: s1,s2 rrr   Respiratory: BL LL few rales   Abdomen: soft, nt, nd   Musculoskeletal: no leg edema   Data Reviewed: Basic Metabolic Panel:  Recent Labs Lab 08/25/15 1049 08/26/15 0544 09/01/2015 0552 09/14/2015 0558 09/01/15 0528  NA 134* 134*  --  130* 130*  K 4.2 4.3  --  4.4 4.4  CL 98* 99*  --  97* 98*  CO2 29 28  --   --  25  GLUCOSE 129* 91  --  101* 100*  BUN 35* 34*  --  38* 41*  CREATININE 2.71* 2.60* 2.57* 2.60* 2.63*  CALCIUM 8.9 8.8*  --   --  8.4*   Liver Function Tests:  Recent Labs Lab 09/19/2015 0552  AST 24  ALT 12*  ALKPHOS 45  BILITOT 0.2*  PROT 5.9*  ALBUMIN 2.2*    Recent Labs Lab 09/27/2015 0552  LIPASE 39   No results for input(s): AMMONIA in the last 168 hours. CBC:  Recent Labs Lab 08/25/15 1051 09/23/2015 0552 09/14/2015 0558 09/01/15 0528  WBC 6.7 7.5  --  7.5  NEUTROABS  --  5.2  --   --   HGB 10.1* 9.0* 9.5* 8.8*  HCT 32.4* 28.5* 28.0* 27.2*  MCV 92.6 89.6  --  88.9  PLT 299 251  --  257   Cardiac Enzymes:  Recent Labs Lab 08/25/15 1049  TROPONINI 0.05*   BNP (last 3 results)  Recent Labs  08/04/15 1316 08/20/15 1511  BNP 101.3* 135.9*    ProBNP (last 3 results) No results for input(s): PROBNP in the last 8760 hours.  CBG: No results for input(s): GLUCAP in the last 168 hours.  No results found for this or any previous visit (from the past 240 hour(s)).   Studies: Dg Abd Acute W/chest  09/05/2015   CLINICAL DATA:  Acute onset of fever. Recently diagnosed pneumonia. Initial encounter.  EXAM: DG ABDOMEN ACUTE W/ 1V CHEST  COMPARISON:  Chest radiograph performed 08/23/2015  FINDINGS: Worsening bilateral airspace opacification is noted, likely reflecting worsening pneumonia. No definite pleural effusion or pneumothorax is seen.  The cardiomediastinal silhouette is enlarged. No acute osseous abnormalities are identified.  The visualized bowel gas pattern  is grossly unremarkable, with scattered air filled loops of small and large bowel. Scattered vascular calcifications are seen. No free intra-abdominal air is identified on the provided decubitus view.  Calcifications at the pelvis may reflect calcified fibroids. The patient's left hip arthroplasty is grossly unremarkable, though incompletely assessed. No acute osseous abnormalities are seen.  IMPRESSION: 1. Worsening bilateral airspace opacification likely reflects worsening pneumonia. 2. Cardiomegaly noted. 3. Unremarkable bowel gas pattern; no free intra-abdominal air seen.   Electronically Signed   By: Garald Balding M.D.   On: 09/15/2015 05:54    Scheduled Meds: . antiseptic oral rinse  7 mL Mouth Rinse q12n4p  . aspirin EC  81 mg Oral Daily  . chlorhexidine  15 mL Mouth Rinse BID  . feeding supplement (ENSURE ENLIVE)  237 mL Oral BID BM  . heparin  5,000 Units Subcutaneous Q12H  . multivitamin with minerals  1 tablet Oral Daily  . pantoprazole  20 mg Oral Daily  . piperacillin-tazobactam (ZOSYN)  IV  2.25 g Intravenous 3 times per day  . sodium chloride  3 mL Intravenous Q12H  . [START ON 09/02/2015] vancomycin  1,000 mg Intravenous Q48H   Continuous Infusions:   Active Problems:   Bladder prolapse, female, acquired   Alzheimer's disease   HCAP (healthcare-associated pneumonia)    Time spent: >35 minutes     Kinnie Feil  [facility] Pager [phone]. If 7PM-7AM, please contact night-coverage at [email or web address], password [email or web address] 09/01/2015, 9:32 AM  LOS: 1 day

## 2015-09-02 ENCOUNTER — Inpatient Hospital Stay (HOSPITAL_COMMUNITY): Payer: Medicare Other

## 2015-09-02 DIAGNOSIS — N184 Chronic kidney disease, stage 4 (severe): Secondary | ICD-10-CM

## 2015-09-02 DIAGNOSIS — K219 Gastro-esophageal reflux disease without esophagitis: Secondary | ICD-10-CM

## 2015-09-02 DIAGNOSIS — I5032 Chronic diastolic (congestive) heart failure: Secondary | ICD-10-CM

## 2015-09-02 DIAGNOSIS — N179 Acute kidney failure, unspecified: Secondary | ICD-10-CM

## 2015-09-02 DIAGNOSIS — K029 Dental caries, unspecified: Secondary | ICD-10-CM

## 2015-09-02 DIAGNOSIS — E871 Hypo-osmolality and hyponatremia: Secondary | ICD-10-CM

## 2015-09-02 LAB — BLOOD GAS, ARTERIAL
ACID-BASE DEFICIT: 2.5 mmol/L — AB (ref 0.0–2.0)
Bicarbonate: 21 mEq/L (ref 20.0–24.0)
DRAWN BY: 308601
FIO2: 1
O2 Saturation: 88.7 %
PCO2 ART: 33.5 mmHg — AB (ref 35.0–45.0)
PH ART: 7.414 (ref 7.350–7.450)
Patient temperature: 98.6
TCO2: 19.6 mmol/L (ref 0–100)
pO2, Arterial: 54.4 mmHg — ABNORMAL LOW (ref 80.0–100.0)

## 2015-09-02 LAB — BRAIN NATRIURETIC PEPTIDE: B Natriuretic Peptide: 408.6 pg/mL — ABNORMAL HIGH (ref 0.0–100.0)

## 2015-09-02 LAB — CBC
HCT: 29.8 % — ABNORMAL LOW (ref 36.0–46.0)
HEMOGLOBIN: 9.6 g/dL — AB (ref 12.0–15.0)
MCH: 29 pg (ref 26.0–34.0)
MCHC: 32.2 g/dL (ref 30.0–36.0)
MCV: 90 fL (ref 78.0–100.0)
Platelets: 283 10*3/uL (ref 150–400)
RBC: 3.31 MIL/uL — AB (ref 3.87–5.11)
RDW: 15 % (ref 11.5–15.5)
WBC: 9.1 10*3/uL (ref 4.0–10.5)

## 2015-09-02 LAB — BASIC METABOLIC PANEL
ANION GAP: 11 (ref 5–15)
BUN: 44 mg/dL — ABNORMAL HIGH (ref 6–20)
CHLORIDE: 96 mmol/L — AB (ref 101–111)
CO2: 24 mmol/L (ref 22–32)
Calcium: 8.6 mg/dL — ABNORMAL LOW (ref 8.9–10.3)
Creatinine, Ser: 2.92 mg/dL — ABNORMAL HIGH (ref 0.44–1.00)
GFR calc non Af Amer: 13 mL/min — ABNORMAL LOW (ref >60)
GFR, EST AFRICAN AMERICAN: 15 mL/min — AB (ref >60)
Glucose, Bld: 100 mg/dL — ABNORMAL HIGH (ref 65–99)
POTASSIUM: 4.4 mmol/L (ref 3.5–5.1)
SODIUM: 131 mmol/L — AB (ref 135–145)

## 2015-09-02 LAB — VANCOMYCIN, RANDOM: Vancomycin Rm: 14 ug/mL

## 2015-09-02 MED ORDER — LORAZEPAM 2 MG/ML IJ SOLN
1.0000 mg | Freq: Once | INTRAMUSCULAR | Status: AC
  Administered 2015-09-02: 1 mg via INTRAVENOUS

## 2015-09-02 MED ORDER — MORPHINE SULFATE (PF) 2 MG/ML IV SOLN
INTRAVENOUS | Status: AC
  Administered 2015-09-02: 2 mg via INTRAVENOUS
  Filled 2015-09-02: qty 1

## 2015-09-02 MED ORDER — LORAZEPAM 2 MG/ML IJ SOLN
0.5000 mg | Freq: Once | INTRAMUSCULAR | Status: AC
  Administered 2015-09-02: 0.5 mg via INTRAVENOUS

## 2015-09-02 MED ORDER — SODIUM CHLORIDE 0.9 % IV SOLN
INTRAVENOUS | Status: DC
  Administered 2015-09-02: 10:00:00 via INTRAVENOUS

## 2015-09-02 MED ORDER — MORPHINE SULFATE (PF) 2 MG/ML IV SOLN
1.0000 mg | INTRAVENOUS | Status: DC | PRN
  Administered 2015-09-02: 2 mg via INTRAVENOUS

## 2015-09-02 MED ORDER — METOPROLOL TARTRATE 1 MG/ML IV SOLN
2.5000 mg | Freq: Once | INTRAVENOUS | Status: AC
  Administered 2015-09-02: 2.5 mg via INTRAVENOUS

## 2015-09-02 MED ORDER — HYOSCYAMINE SULFATE 0.125 MG/5ML PO ELIX
0.2500 mg | ORAL_SOLUTION | ORAL | Status: DC | PRN
  Filled 2015-09-02: qty 2

## 2015-09-02 MED ORDER — ACETAMINOPHEN 325 MG PO TABS
650.0000 mg | ORAL_TABLET | ORAL | Status: DC | PRN
  Administered 2015-09-02: 650 mg via ORAL
  Filled 2015-09-02: qty 2

## 2015-09-02 MED ORDER — MORPHINE SULFATE (PF) 2 MG/ML IV SOLN
1.0000 mg | Freq: Once | INTRAVENOUS | Status: AC
  Administered 2015-09-02: 1 mg via INTRAVENOUS

## 2015-09-02 MED ORDER — MORPHINE SULFATE (PF) 2 MG/ML IV SOLN
2.0000 mg | INTRAVENOUS | Status: AC
  Administered 2015-09-02: 2 mg via INTRAVENOUS

## 2015-09-02 MED ORDER — LORAZEPAM 2 MG/ML IJ SOLN
0.5000 mg | INTRAMUSCULAR | Status: DC | PRN
  Administered 2015-09-02 – 2015-09-06 (×2): 0.5 mg via INTRAVENOUS
  Filled 2015-09-02 (×2): qty 1

## 2015-09-02 MED ORDER — ACETAMINOPHEN 650 MG RE SUPP: 650.0000 mg | Freq: Four times a day (QID) | RECTAL | Status: DC | PRN

## 2015-09-02 MED ORDER — FAMOTIDINE 20 MG PO TABS: 20.0000 mg | ORAL_TABLET | ORAL | Status: DC

## 2015-09-02 MED ORDER — VANCOMYCIN HCL IN DEXTROSE 1-5 GM/200ML-% IV SOLN
1000.0000 mg | INTRAVENOUS | Status: DC
  Administered 2015-09-02: 1000 mg via INTRAVENOUS
  Filled 2015-09-02: qty 200

## 2015-09-02 MED ORDER — METOPROLOL TARTRATE 1 MG/ML IV SOLN
INTRAVENOUS | Status: AC
  Administered 2015-09-02: 20:00:00
  Filled 2015-09-02: qty 5

## 2015-09-02 MED ORDER — FUROSEMIDE 10 MG/ML IJ SOLN
60.0000 mg | Freq: Once | INTRAMUSCULAR | Status: AC
  Administered 2015-09-02: 60 mg via INTRAVENOUS

## 2015-09-02 MED ORDER — SODIUM CHLORIDE 0.9 % IV SOLN
2.0000 mg/h | INTRAVENOUS | Status: DC
  Administered 2015-09-02: 5 mg/h via INTRAVENOUS
  Administered 2015-09-04: 4 mg/h via INTRAVENOUS
  Administered 2015-09-06: 7 mg/h via INTRAVENOUS
  Filled 2015-09-02 (×4): qty 10

## 2015-09-02 MED ORDER — METOPROLOL TARTRATE 1 MG/ML IV SOLN
5.0000 mg | Freq: Once | INTRAVENOUS | Status: AC
  Administered 2015-09-02: 5 mg via INTRAVENOUS
  Filled 2015-09-02: qty 5

## 2015-09-02 MED ORDER — ENOXAPARIN SODIUM 30 MG/0.3ML ~~LOC~~ SOLN
30.0000 mg | SUBCUTANEOUS | Status: DC
  Filled 2015-09-02: qty 0.3

## 2015-09-02 MED ORDER — MORPHINE SULFATE (PF) 2 MG/ML IV SOLN
2.0000 mg | INTRAVENOUS | Status: AC
  Administered 2015-09-02: 2 mg via INTRAVENOUS
  Filled 2015-09-02: qty 1

## 2015-09-02 MED ORDER — FUROSEMIDE 10 MG/ML IJ SOLN
INTRAMUSCULAR | Status: AC
  Filled 2015-09-02: qty 6

## 2015-09-02 MED ORDER — FAMOTIDINE 20 MG PO TABS
20.0000 mg | ORAL_TABLET | Freq: Every day | ORAL | Status: DC
  Administered 2015-09-02: 20 mg via ORAL
  Filled 2015-09-02: qty 1

## 2015-09-02 NOTE — Progress Notes (Addendum)
This is a summary of shift events: Dr. Rockne Menghini paged this NP at shift change to report pt off secondary to pt having respiratory distress and desaturation. Dr. Rockne Menghini ordered a stat CXR and BNP. NP straight to bedside at 1900 hrs.  S: endorses SOB and some mild chest pain that is dull. States this episode of SOB "just came on her" a few minutes ago, but she had been SOB at home. RN had progressed pt from 4L Anna to NRB at 100% when O2 sats dropped to the 50s. When asked, she expresses no desire to be intubated and gives this NP permission to call her son. O: Frail, acutely ill appearing elderly WF in moderate respiratory distress. She is alert and aware of her situation. She appears anxious. HR upper 100s and regular. SaO2 on NRB is 94-100%. RR is 40s with increased WOB. BP 150s/90s. No focal neuro deficit noted. Skin pale, dry.  A/P: 1. Hypoxia-CXR with severe pulmonary edema as expected as NP figured this was flash. Satting OK on the NRB but will require bipap for WOB. Lasix 60mg  given. Morphine 1mg  given. Metoprolol 2.5mg  given for BP and tachycardia. ABG showed low PO2. After speaking with son, [name], it was decided to give Bipap a try, so pt moved urgently to SDU.  NP had long discussion with [name] about tonight's events and informed him that if pt fails bipap, comfort care would likely be only choice given pt does not want to be intubated. [name] was very reasonable and grateful for the care and states that his mom has been hospitalized multiple times in the recent past few weeks and has been suffering at home.  After pt moved to SDU, Bipap applied. Pt required Ativan and Morphine to tolerate mask. CXR showed extensive pulmonary edema. BP climbed to 170s. Lasix 60mg  was repeated along with more morphine, Metoprolol, and Ativan. Pt having difficulty tolerating the bipap mask. After trial of Bipap, pt looked miserable, was restless and moaning. She was asking for mask to be removed. At that point, she was  only satting in the 80s on the bipap. NP again had talk with son. After discussion, he agreed his mom looked miserable. He stated he didn't want his mom to suffer anymore. Agreed to removal of Bipap, morphine drip and comfort care. NRB replaced after bipap removed.  Support given to family members who are at bedside with pt. Will titrate drip to make pt comfortable. Clance Boll, NP [facility] Update: Morphine has been titrated for comfort and pt has been resting quietly. This am she is having periods of apnea with a BP in the 50s. Spoke to family and told that death would likely be soon. Son and granddaughter of pt remain at bedside. Declined chaplain at this time.  KJKG, NP [facility]

## 2015-09-02 NOTE — Progress Notes (Addendum)
Progress Note   [name] IRS:[id number] DOB: [birth year] DOA: 09/17/2015 PCP: Gildardo Cranker, DO   Brief Narrative:   [name] is an 81 y.o. female with a PMH of dementia, chronic diastolic CHF (EF 00-93 percent, grade 1 diastolic dysfunction on echo 08/05/15), stage IV CK D, recent hospitalization 08/20/15-08/25/15 for treatment of decompensated CHF and healthcare associated pneumonia who was readmitted Sep 17, 2015 with fever and cough. Chest x-ray done on admission showed worsening bilateral infiltrates.  Assessment/Plan:   Principal Problem:   HCAP (healthcare-associated pneumonia) rule out aspiration pneumonia / dental caries / toothache - Patient has been hospitalized 3 times with respiratory symptoms and pneumonia in the past 3 months. - Continue mouth care with Peridex.  Continue Anbesol. - Speech therapy evaluation pending. Dental medicine consult needed, have requested an evaluation. - Continue empiric vancomycin and Zosyn for now. - Follow-up blood and sputum cultures. - Given current problems with pneumonia and high risk for further decompensation, will request palliative care consult.  Active Problems:   Bladder prolapse, female, acquired - Evaluated by urologist during previous admission. Has refused pessary.    GERD  - Given history of multiple courses of antibiotics and high risk for C. difficile infection, D/C PPI. Use Pepcid.    Alzheimer's disease - Supportive care per nursing staff.    Acute kidney injury ([place]) in the setting of Chronic kidney disease (CKD), stage IV (severe) (HCC) - Creatinine rising. Baseline creatinine 2.5-2.6. Current creatinine 2.92. We'll start gentle IV fluids. - Monitor closely for signs of volume overload.    Hyponatremia - Likely secondary to dehydration. Gently hydrate and monitor.    Chronic diastolic CHF (congestive heart failure) (HCC) - Monitor I/O and daily weights.    DVT Prophylaxis - Lovenox ordered.  Family  Communication: Son visits daily, currently not at bedside. Disposition Plan: From [facility], d/c back there when stable. Code Status:     Code Status Orders        Start     Ordered   09/17/15 0921  Do not attempt resuscitation (DNR)   Continuous    Question Answer Comment  In the event of cardiac or respiratory ARREST Do not call a "code blue"   In the event of cardiac or respiratory ARREST Do not perform Intubation, CPR, defibrillation or ACLS   In the event of cardiac or respiratory ARREST Use medication by any route, position, wound care, and other measures to relive pain and suffering. May use oxygen, suction and manual treatment of airway obstruction as needed for comfort.      2015/09/17 0920    Advance Directive Documentation        Most Recent Value   Type of Advance Directive  Out of facility DNR (pink MOST or yellow form)   Pre-existing out of facility DNR order (yellow form or pink MOST form)  Yellow form placed in chart (order not valid for inpatient use)   "MOST" Form in Place?          IV Access:    Peripheral IV   Procedures and diagnostic studies:   Dg Abd Acute W/chest  09/17/15   CLINICAL DATA:  Acute onset of fever. Recently diagnosed pneumonia. Initial encounter.  EXAM: DG ABDOMEN ACUTE W/ 1V CHEST  COMPARISON:  Chest radiograph performed 08/23/2015  FINDINGS: Worsening bilateral airspace opacification is noted, likely reflecting worsening pneumonia. No definite pleural effusion or pneumothorax is seen.  The cardiomediastinal silhouette is enlarged. No acute osseous  abnormalities are identified.  The visualized bowel gas pattern is grossly unremarkable, with scattered air filled loops of small and large bowel. Scattered vascular calcifications are seen. No free intra-abdominal air is identified on the provided decubitus view.  Calcifications at the pelvis may reflect calcified fibroids. The patient's left hip arthroplasty is grossly unremarkable, though  incompletely assessed. No acute osseous abnormalities are seen.  IMPRESSION: 1. Worsening bilateral airspace opacification likely reflects worsening pneumonia. 2. Cardiomegaly noted. 3. Unremarkable bowel gas pattern; no free intra-abdominal air seen.   Electronically Signed   By: Garald Balding M.D.   On: 09/04/2015 05:54     Medical Consultants:    Palliative Care  Anti-Infectives:   Anti-infectives    Start     Dose/Rate Route Frequency Ordered Stop   09/02/15 0800  vancomycin (VANCOCIN) IVPB 1000 mg/200 mL premix  Status:  Discontinued     1,000 mg 200 mL/hr over 60 Minutes Intravenous Every 48 hours 09/15/2015 1002 09/02/15 0758   09/02/2015 1400  piperacillin-tazobactam (ZOSYN) IVPB 2.25 g     2.25 g 100 mL/hr over 30 Minutes Intravenous 3 times per day 08/29/2015 1002     09/17/2015 0615  vancomycin (VANCOCIN) IVPB 1000 mg/200 mL premix     1,000 mg 200 mL/hr over 60 Minutes Intravenous  Once 08/29/2015 0602 09/23/2015 0750   09/19/2015 0615  piperacillin-tazobactam (ZOSYN) IVPB 3.375 g     3.375 g 100 mL/hr over 30 Minutes Intravenous  Once 09/06/2015 0602 09/01/2015 4825      Subjective:   [name] reports tooth pain, otherwise without pain. No N/V or dyspnea.  No cough.  Reports that she is "eating pretty good, but is on a liquid diet".  Objective:    Filed Vitals:   09/01/15 1500 09/01/15 2102 09/02/15 0228 09/02/15 0505  BP: 122/52 90/53 118/54 128/52  Pulse: 100 81 89 101  Temp: 99.1 F (37.3 C) 98 F (36.7 C) 98.4 F (36.9 C) 98.2 F (36.8 C)  TempSrc: Oral Oral Oral Oral  Resp: 18 18 18 18   SpO2: 90% 94% 92% 87%    Intake/Output Summary (Last 24 hours) at 09/02/15 0758 Last data filed at 09/02/15 0528  Gross per 24 hour  Intake    720 ml  Output    400 ml  Net    320 ml   There were no vitals filed for this visit.  Exam: Gen:  NAD Oropharynx: Multiple dental caries, missing teeth, broken teeth Cardiovascular:  RRR, No M/R/G Respiratory:  Lungs  CTAB Gastrointestinal:  Abdomen soft, NT/ND, + BS Extremities:  No C/E/C   Data Reviewed:    Labs: Basic Metabolic Panel:  Recent Labs Lab 09/24/2015 0552  09/25/2015 0558 09/01/15 0528 09/02/15 0614  NA  --   --  130* 130* 131*  K  --   < > 4.4 4.4 4.4  CL  --   --  97* 98* 96*  CO2  --   --   --  25 24  GLUCOSE  --   --  101* 100* 100*  BUN  --   --  38* 41* 44*  CREATININE 2.57*  --  2.60* 2.63* 2.92*  CALCIUM  --   --   --  8.4* 8.6*  < > = values in this interval not displayed. GFR Estimated Creatinine Clearance: 10.1 mL/min (by C-G formula based on Cr of 2.92). Liver Function Tests:  Recent Labs Lab 09/08/2015 0552  AST 24  ALT 12*  ALKPHOS 45  BILITOT 0.2*  PROT 5.9*  ALBUMIN 2.2*    Recent Labs Lab 09/21/2015 0552  LIPASE 39   CBC:  Recent Labs Lab 09/25/2015 0552 09/17/2015 0558 09/01/15 0528 09/02/15 0614  WBC 7.5  --  7.5 9.1  NEUTROABS 5.2  --   --   --   HGB 9.0* 9.5* 8.8* 9.6*  HCT 28.5* 28.0* 27.2* 29.8*  MCV 89.6  --  88.9 90.0  PLT 251  --  257 283   Anemia work up:  Recent Labs  09/01/15 1144  FERRITIN 48  TIBC 182*  IRON 15*   Sepsis Labs:  Recent Labs Lab 09/12/2015 0552 09/01/15 0528 09/02/15 0614  WBC 7.5 7.5 9.1   Microbiology Recent Results (from the past 240 hour(s))  Blood culture (routine x 2)     Status: None (Preliminary result)   Collection Time: 09/20/2015  6:30 AM  Result Value Ref Range Status   Specimen Description BLOOD LEFT FOREARM  Final   Special Requests BOTTLES DRAWN AEROBIC AND ANAEROBIC 5ML  Final   Culture   Final    NO GROWTH 1 DAY Performed at [facility]    Report Status PENDING  Incomplete  Blood culture (routine x 2)     Status: None (Preliminary result)   Collection Time: 09/11/2015  6:35 AM  Result Value Ref Range Status   Specimen Description BLOOD RIGHT FOREARM  Final   Special Requests BOTTLES DRAWN AEROBIC AND ANAEROBIC 5ML  Final   Culture   Final    NO GROWTH 1 DAY Performed  at [facility]    Report Status PENDING  Incomplete     Medications:   . antiseptic oral rinse  7 mL Mouth Rinse q12n4p  . aspirin EC  81 mg Oral Daily  . chlorhexidine  15 mL Mouth Rinse BID  . feeding supplement (ENSURE ENLIVE)  237 mL Oral BID BM  . heparin  5,000 Units Subcutaneous Q12H  . multivitamin with minerals  1 tablet Oral Daily  . pantoprazole  20 mg Oral Daily  . piperacillin-tazobactam (ZOSYN)  IV  2.25 g Intravenous 3 times per day  . sodium chloride  3 mL Intravenous Q12H   Continuous Infusions:   Time spent: 35 minutes.  The patient is medically complex and requires high complexity decision making & coordination of care with multiple specialists.    LOS: 2 days   [name]  [facility] Pager [phone]. If unable to reach me by pager, please call my cell phone at [phone].  *Please refer to [email or web address], password [email or web address] to get updated schedule on who will round on this patient, as hospitalists switch teams weekly. If 7PM-7AM, please contact night-coverage at [email or web address], password [email or web address] for any overnight needs.  09/02/2015, 7:58 AM

## 2015-09-02 NOTE — Progress Notes (Addendum)
ANTIBIOTIC CONSULT NOTE  Pharmacy Consult for Vancomycin & Zosyn Indication: pneumonia  No Known Allergies  Patient Measurements: Weight: 144 lb 6.4 oz (65.5 kg)  Vital Signs: Temp: 99.1 F (37.3 C) (10/05 1043) Temp Source: Axillary (10/05 0759) BP: 128/52 mmHg (10/05 0505) Pulse Rate: 101 (10/05 0505) Intake/Output from previous day: 10/04 0701 - 10/05 0700 In: 720 [P.O.:570; IV Piggyback:150] Out: 400 [Urine:400] Intake/Output from this shift: Total I/O In: 120 [P.O.:120] Out: -   Labs:  Recent Labs  09/21/2015 0552 09/17/2015 0558 09/01/15 0528 09/02/15 0614  WBC 7.5  --  7.5 9.1  HGB 9.0* 9.5* 8.8* 9.6*  PLT 251  --  257 283  CREATININE 2.57* 2.60* 2.63* 2.92*   Estimated Creatinine Clearance: 10.5 mL/min (by C-G formula based on Cr of 2.92).  Recent Labs  09/02/15 1005  VANCORANDOM 14     Microbiology: Recent Results (from the past 720 hour(s))  Culture, blood (routine x 2)     Status: None   Collection Time: 08/04/15 12:55 PM  Result Value Ref Range Status   Specimen Description BLOOD RIGHT ANTECUBITAL  Final   Special Requests BOTTLES DRAWN AEROBIC AND ANAEROBIC 10ML  Final   Culture   Final    NO GROWTH 5 DAYS Performed at [facility]    Report Status 08/09/2015 FINAL  Final  Culture, blood (routine x 2)     Status: None   Collection Time: 08/04/15  1:26 PM  Result Value Ref Range Status   Specimen Description BLOOD LEFT ANTECUBITAL  Final   Special Requests BOTTLES DRAWN AEROBIC AND ANAEROBIC 5ML  Final   Culture   Final    NO GROWTH 5 DAYS Performed at [facility]    Report Status 08/09/2015 FINAL  Final  MRSA PCR Screening     Status: None   Collection Time: 08/21/15  3:27 AM  Result Value Ref Range Status   MRSA by PCR NEGATIVE NEGATIVE Final    Comment:        The GeneXpert MRSA Assay (FDA approved for NASAL specimens only), is one component of a comprehensive MRSA colonization surveillance program. It is  not intended to diagnose MRSA infection nor to guide or monitor treatment for MRSA infections.   Blood culture (routine x 2)     Status: None (Preliminary result)   Collection Time: 08/30/2015  6:30 AM  Result Value Ref Range Status   Specimen Description BLOOD LEFT FOREARM  Final   Special Requests BOTTLES DRAWN AEROBIC AND ANAEROBIC 5ML  Final   Culture   Final    NO GROWTH 2 DAYS Performed at [facility]    Report Status PENDING  Incomplete  Blood culture (routine x 2)     Status: None (Preliminary result)   Collection Time: 09/10/2015  6:35 AM  Result Value Ref Range Status   Specimen Description BLOOD RIGHT FOREARM  Final   Special Requests BOTTLES DRAWN AEROBIC AND ANAEROBIC 5ML  Final   Culture   Final    NO GROWTH 2 DAYS Performed at [facility]    Report Status PENDING  Incomplete    Anti-infectives    Start     Dose/Rate Route Frequency Ordered Stop   09/02/15 1200  vancomycin (VANCOCIN) IVPB 1000 mg/200 mL premix     1,000 mg 200 mL/hr over 60 Minutes Intravenous Every 48 hours 09/02/15 1105     09/02/15 0800  vancomycin (VANCOCIN) IVPB 1000 mg/200 mL premix  Status:  Discontinued     1,000 mg 200 mL/hr over 60 Minutes Intravenous Every 48 hours 09/09/2015 1002 09/02/15 0758   09/26/2015 1400  piperacillin-tazobactam (ZOSYN) IVPB 2.25 g     2.25 g 100 mL/hr over 30 Minutes Intravenous 3 times per day 09/11/2015 1002     08/30/2015 0615  vancomycin (VANCOCIN) IVPB 1000 mg/200 mL premix     1,000 mg 200 mL/hr over 60 Minutes Intravenous  Once 08/30/2015 0602 09/12/2015 0750   09/05/2015 0615  piperacillin-tazobactam (ZOSYN) IVPB 3.375 g     3.375 g 100 mL/hr over 30 Minutes Intravenous  Once 09/23/2015 0602 09/15/2015 0717      Assessment: 81 yo F admitted from SNF with PNA (recent hospitalization 9/22-9/28/16).  Reported fever of 100.68F at nursing facility and cough.  CXR + worsening bilateral infiltrates. Patient continues on Vancomycin & Zosyn for HCAP.  She  spiked fever this morning (101.68F).  WBC wnl, but increasing.  Cx data remains negative.  Known CKD- Scr has been trending up since admission.   Estimated CrCl ~ 58ml/min.   Random Vanc level <50mcg/ml- pt clearing Vancomycin as anticipated.   10/3 >>Vanc >> 10/3 >> Zosyn  >>    10/3 blood: IP  Dose changes/levels: 10/5: RVL: 14 ~52hrs after Vancomycin 1gm dose  Goal of Therapy:  Vancomycin trough level 15-20 mcg/ml  Plan:  Continue Zosyn 2.25gm IV q8h Continue Vancomycin 1gm IV q48h Recheck Vancomycin trough at steady state Monitor renal function and cx data   Biagio Borg 09/02/2015,11:05 AM

## 2015-09-02 NOTE — Progress Notes (Signed)
Physical Therapy Treatment Patient Details Name: ABAIGEAL MOOMAW MRN: [id number] DOB: 17-Jan-1919 Today's Date: 09/02/2015    History of Present Illness 81 year old female adm with worsening HCAP; past medical history of mild dementia, severe bladder prolapse, stage IV chronic kidney disease and recent admission for pneumonia and CHF     PT Comments    Assisted with 2 persons to [facility] then back to bed, patient did not really assist.  Follow Up Recommendations  SNF;Supervision/Assistance - 24 hour     Equipment Recommendations  None recommended by PT    Recommendations for Other Services       Precautions / Restrictions Precautions Precautions: Fall Precaution Comments: monitor sats/DOE, prolapsed bladder    Mobility  Bed Mobility         Supine to sit: Max assist;+2 for physical assistance Sit to supine: Max assist;+2 for physical assistance   General bed mobility comments: patient resistive, bed pad used for  sitting up and  PT lifted legs onto bed.  Transfers Overall transfer level: Needs assistance   Transfers: Stand Pivot Transfers;Sit to/from Stand Sit to Stand: Max assist;+2 physical assistance Stand pivot transfers: Max assist;+2 physical assistance       General transfer comment: pivot to bsc with bear hug, legs flexed, poor standing  Ambulation/Gait                 Stairs            Wheelchair Mobility    Modified Rankin (Stroke Patients Only)       Balance                                    Cognition Arousal/Alertness: Awake/alert   Overall Cognitive Status: History of cognitive impairments - at baseline                      Exercises      General Comments        Pertinent Vitals/Pain Faces Pain Scale: Hurts whole lot Pain Location: periarea Pain Descriptors / Indicators: Crushing;Discomfort;Grimacing;Guarding Pain Intervention(s): Limited activity within patient's tolerance    Home Living                       Prior Function            PT Goals (current goals can now be found in the care plan section) Progress towards PT goals: Progressing toward goals    Frequency  Min 2X/week    PT Plan Frequency needs to be updated    Co-evaluation             End of Session   Activity Tolerance: Patient limited by pain Patient left: in bed;with call bell/phone within reach;with bed alarm set;with nursing/sitter in room;with family/visitor present     Time: 1440-1509 PT Time Calculation (min) (ACUTE ONLY): 29 min  Charges:  $Therapeutic Activity: 23-37 mins                    G Codes:      Claretha Cooper 09/02/2015, 3:53 PM

## 2015-09-02 NOTE — Clinical Documentation Improvement (Signed)
  Internal Medicine  Can the diagnosis of acute renal failure be further specified?   Acute Renal Failure/Acute Kidney Injury  Acute Tubular Necrosis  Acute Renal Cortical Necrosis  Acute Renal Medullary Necrosis  Acute on Chronic Renal Failure  Chronic Renal Failure  Other  Clinically Undetermined  Document any associated diagnoses/conditions.   Supporting Information: Pt is known to have CKD 3.  BUN on admission was 41 and creatinine 2.63 and GFR - 15.  Currently BUN - 44; Creatinine - 2.92; GFR 13.    Please exercise your independent, professional judgment when responding. A specific answer is not anticipated or expected.   Thank You,  [place] [phone]

## 2015-09-02 NOTE — Care Management Important Message (Signed)
Important Message  Patient Details  Name: [name] MRN: [id number] Date of Birth: 06-22-1919   Medicare Important Message Given:  Yes-second notification given    Camillo Flaming 09/02/2015, 12:05 [place] Message  Patient Details  Name: [name] MRN: [id number] Date of Birth: 15-Feb-1919   Medicare Important Message Given:  Yes-second notification given    Camillo Flaming 09/02/2015, 12:05 PM

## 2015-09-03 DIAGNOSIS — F0281 Dementia in other diseases classified elsewhere with behavioral disturbance: Secondary | ICD-10-CM

## 2015-09-03 DIAGNOSIS — G301 Alzheimer's disease with late onset: Secondary | ICD-10-CM

## 2015-09-03 NOTE — Progress Notes (Signed)
CSW assisting with d/c planning. Pt is from [facility]. Updated clinicals sent to SNF. CSW will continue to follow to assist with d/cplanning back to SNF when stable.  Roselyn Reef Leanore Biggers LCSW [phone]

## 2015-09-03 NOTE — Progress Notes (Signed)
   09/03/15 1500  Clinical Encounter Type  Visited With Patient and family together ((pt not responsive))  Visit Type Patient actively dying;Spiritual support;Social support  Referral From Nurse  Spiritual Encounters  Spiritual Needs Emotional;Grief support  Stress Factors  Family Stress Factors Major life changes;Loss of control   Spent ca 45 minutes with family at bedside this morning.  Present were son [name] and his wife [name], granddaughter [name] and her boyfriend, and grandson who arrived from [place] during the encounter.   Family is coping well:  processing with each other, reminiscing about recent opportunities they made to enable [name] to see old homeplaces, talking to her to assure her that they will take care of each other and be okay.  Per son Alyssa, [name] decline has been significant in the past five weeks, which has required rapid emotional adjustment and grieving from the whole family.  An only child, he is processing what pt's death will mean for him, as well as revisiting his grief from his dad's death.  His wife [name] appears to be a thoughtful supporter, having lost both of her parents; she is thinking aloud about memorial service details, which she was able to discuss with Evalise recently.  (Funeral home arrangements were prepaid years ago, which family receives as a Warden/ranger gift.)  Pt's granddaughter [name] is also very thoughtful in her approach to grieving and spending time at [place]'s bedside; she in fact was the family member who accepted chaplain support because pt would value it.  Assisted family with pastoral reflection, story-telling, meaning-making, and (alongside RN) what the dying process can look like physically in order to assist family with anticipatory grief and being comfortable at bedside.  Family verbalized gratitude for emotional support, assistance with what to expect, normalization of feelings, and prayer shawl for pt.  They are aware of ongoing  chaplain availability.  [name] also has my office number in case she would like further assistance with thinking through or leading memorial service. Please also page [facility] chaplain 24/7 as needs arise. Thank you.  [place], [place], [facility] pager:  [phone]

## 2015-09-03 NOTE — Progress Notes (Signed)
Progress Note   [name] XFG:[id number] DOB: Sep 06, 1919 DOA: 09/25/2015 PCP: Gildardo Cranker, DO   Brief Narrative:   81 y.o. female with a PMH of dementia, chronic diastolic CHF (EF 96-78 percent, grade 1 diastolic dysfunction on echo 08/05/15), stage IV CK D, recent hospitalization 08/20/15-08/25/15 for treatment of decompensated CHF and healthcare associated pneumonia who was readmitted 09/22/2015 with fever and cough. Chest x-ray done on admission showed worsening bilateral infiltrates.  Assessment/Plan:   Principal Problem:   HCAP (healthcare-associated pneumonia) rule out aspiration pneumonia / dental caries / toothache - Patient has been hospitalized 3 times with respiratory symptoms and pneumonia in the past 3 months. - poor clinical response to ABX, after discussion with family, desire is to focus on comfort and place pt on Morphine drip - family at bedside and confirm that dull comfort is desired   Active Problems:   Bladder prolapse, female, acquired - Evaluated by urologist during previous admission. Has refused pessary. - no further interventions in order to ensure comfort     GERD  - Given history of multiple courses of antibiotics and high risk for C. difficile infection, D/C PPI.     Alzheimer's disease - Supportive care per nursing staff.    Acute kidney injury ([place]) in the setting of Chronic kidney disease (CKD), stage IV (severe) (HCC) - Creatinine rising. Baseline creatinine 2.5-2.6. Current creatinine 2.92.  - no further blood work or fluids in order to respect pt and family wishes of full comfort     Hyponatremia - Likely secondary to dehydration.  - no further blood work as noted above     Chronic diastolic CHF (congestive heart failure) ([place]) - stop IVF to avoid volume overload and focus on comfort     DVT Prophylaxis - Lovenox was given but can hold to ensure comfort   Family Communication: Son and family at bedside  Disposition Plan: ? Medical laboratory scientific officer place   Code Status:     Code Status Orders        Start     Ordered   09/26/2015 0921  Do not attempt resuscitation (DNR)   Continuous    Question Answer Comment  In the event of cardiac or respiratory ARREST Do not call a "code blue"   In the event of cardiac or respiratory ARREST Do not perform Intubation, CPR, defibrillation or ACLS   In the event of cardiac or respiratory ARREST Use medication by any route, position, wound care, and other measures to relive pain and suffering. May use oxygen, suction and manual treatment of airway obstruction as needed for comfort.      09/27/2015 0920    Advance Directive Documentation        Most Recent Value   Type of Advance Directive  Out of facility DNR (pink MOST or yellow form)   Pre-existing out of facility DNR order (yellow form or pink MOST form)  Yellow form placed in chart (order not valid for inpatient use)   "MOST" Form in Place?          IV Access:    Peripheral IV   Procedures and diagnostic studies:   Dg Chest Port 1 View  09-19-2015   CLINICAL DATA:  81 year old female with shortness of breath and past history of congestive heart failure  EXAM: PORTABLE CHEST 1 VIEW  COMPARISON:  Prior chest x-ray 09/08/2015  FINDINGS: Extensive bilateral interstitial and airspace opacities which are increased compared to 08/30/2015. The cardiac and mediastinal contours  are largely obscured by the overlying pulmonary opacities. Atherosclerotic calcifications are present in the transverse aorta. No acute osseous abnormality.  IMPRESSION: Marked bilateral pulmonary edema concerning for CHF versus flash pulmonary edema.  Aortic atherosclerosis.   Electronically Signed   By: Jacqulynn Cadet M.D.   On: 09/02/2015 20:00   Dg Abd Acute W/chest  09/19/2015   CLINICAL DATA:  Acute onset of fever. Recently diagnosed pneumonia. Initial encounter.  EXAM: DG ABDOMEN ACUTE W/ 1V CHEST  COMPARISON:  Chest radiograph performed 08/23/2015  FINDINGS: Worsening  bilateral airspace opacification is noted, likely reflecting worsening pneumonia. No definite pleural effusion or pneumothorax is seen.  The cardiomediastinal silhouette is enlarged. No acute osseous abnormalities are identified.  The visualized bowel gas pattern is grossly unremarkable, with scattered air filled loops of small and large bowel. Scattered vascular calcifications are seen. No free intra-abdominal air is identified on the provided decubitus view.  Calcifications at the pelvis may reflect calcified fibroids. The patient's left hip arthroplasty is grossly unremarkable, though incompletely assessed. No acute osseous abnormalities are seen.  IMPRESSION: 1. Worsening bilateral airspace opacification likely reflects worsening pneumonia. 2. Cardiomegaly noted. 3. Unremarkable bowel gas pattern; no free intra-abdominal air seen.   Electronically Signed   By: Garald Balding M.D.   On: 09/02/2015 05:54     Medical Consultants:    Palliative Care  Anti-Infectives:   Anti-infectives    Start     Dose/Rate Route Frequency Ordered Stop   09/02/15 1200  vancomycin (VANCOCIN) IVPB 1000 mg/200 mL premix  Status:  Discontinued     1,000 mg 200 mL/hr over 60 Minutes Intravenous Every 48 hours 09/02/15 1105 09/03/15 0644   09/02/15 0800  vancomycin (VANCOCIN) IVPB 1000 mg/200 mL premix  Status:  Discontinued     1,000 mg 200 mL/hr over 60 Minutes Intravenous Every 48 hours 09/21/2015 1002 09/02/15 0758   09/26/2015 1400  piperacillin-tazobactam (ZOSYN) IVPB 2.25 g  Status:  Discontinued     2.25 g 100 mL/hr over 30 Minutes Intravenous 3 times per day 09/20/2015 1002 09/03/15 0641   09/03/2015 0615  vancomycin (VANCOCIN) IVPB 1000 mg/200 mL premix     1,000 mg 200 mL/hr over 60 Minutes Intravenous  Once 09/12/2015 0602 09/12/2015 0750   09/16/2015 0615  piperacillin-tazobactam (ZOSYN) IVPB 3.375 g     3.375 g 100 mL/hr over 30 Minutes Intravenous  Once 09/04/2015 0602 09/04/2015 0717      Subjective:   On  NRB, difficult to arouse   Objective:    Filed Vitals:   09/03/15 0600 09/03/15 0733 09/03/15 1100 09/03/15 1452  BP: 55/19 53/31  85/26  Pulse: 79 75 80 97  Temp:    99.2 F (37.3 C)  TempSrc:    Axillary  Resp: 16 8 8 13   Height:      Weight:      SpO2: 98% 100%  99%    Intake/Output Summary (Last 24 hours) at 09/03/15 1518 Last data filed at 09/03/15 1100  Gross per 24 hour  Intake   1128 ml  Output    100 ml  Net   1028 ml   Filed Weights   09/02/15 1018  Weight: 65.5 kg (144 lb 6.4 oz)    Exam: Gen:  NAD Oropharynx: Multiple dental caries, missing teeth, broken teeth Cardiovascular:  RRR, No M/R/G Respiratory:  Mild tachypnea but no accessory muscle use Gastrointestinal:  Abdomen soft, NT/ND, + BS Extremities:  No C/E/C   Data Reviewed:  Labs: Basic Metabolic Panel:  Recent Labs Lab 09/04/2015 0552  09/10/2015 0558 09/01/15 0528 09/02/15 0614  NA  --   --  130* 130* 131*  K  --   < > 4.4 4.4 4.4  CL  --   --  97* 98* 96*  CO2  --   --   --  25 24  GLUCOSE  --   --  101* 100* 100*  BUN  --   --  38* 41* 44*  CREATININE 2.57*  --  2.60* 2.63* 2.92*  CALCIUM  --   --   --  8.4* 8.6*  < > = values in this interval not displayed. GFR Estimated Creatinine Clearance: 9.9 mL/min (by C-G formula based on Cr of 2.92). Liver Function Tests:  Recent Labs Lab 09/19/2015 0552  AST 24  ALT 12*  ALKPHOS 45  BILITOT 0.2*  PROT 5.9*  ALBUMIN 2.2*    Recent Labs Lab 09/19/2015 0552  LIPASE 39   CBC:  Recent Labs Lab 09/26/2015 0552 09/05/2015 0558 09/01/15 0528 09/02/15 0614  WBC 7.5  --  7.5 9.1  NEUTROABS 5.2  --   --   --   HGB 9.0* 9.5* 8.8* 9.6*  HCT 28.5* 28.0* 27.2* 29.8*  MCV 89.6  --  88.9 90.0  PLT 251  --  257 283   Anemia work up:  Recent Labs  09/01/15 1144  FERRITIN 48  TIBC 182*  IRON 15*   Sepsis Labs:  Recent Labs Lab 09/25/2015 0552 09/01/15 0528 09/02/15 0614  WBC 7.5 7.5 9.1   Microbiology Recent Results (from  the past 240 hour(s))  Blood culture (routine x 2)     Status: None (Preliminary result)   Collection Time: 09/06/2015  6:30 AM  Result Value Ref Range Status   Specimen Description BLOOD LEFT FOREARM  Final   Special Requests BOTTLES DRAWN AEROBIC AND ANAEROBIC 5ML  Final   Culture   Final    NO GROWTH 3 DAYS Performed at [facility]    Report Status PENDING  Incomplete  Blood culture (routine x 2)     Status: None (Preliminary result)   Collection Time: 09/20/2015  6:35 AM  Result Value Ref Range Status   Specimen Description BLOOD RIGHT FOREARM  Final   Special Requests BOTTLES DRAWN AEROBIC AND ANAEROBIC 5ML  Final   Culture   Final    NO GROWTH 3 DAYS Performed at [facility]    Report Status PENDING  Incomplete     Medications:   . sodium chloride  3 mL Intravenous Q12H   Continuous Infusions: . sodium chloride 10 mL/hr at 09/02/15 2100  . morphine 7 mg/hr (09/03/15 1100)     LOS: 3 days   MAGICK-Laetitia Schnepf  [facility]  09/03/2015, 3:18 PM

## 2015-09-03 NOTE — Progress Notes (Signed)
Spoke with Forrest Moron NP on phone, update given. Pt cont on MS drip at 7 mg/hr, will occas moan but no facial grimace and pt appears comfortable otherwise. Urine output only about 20 ml for this 12 hr shift.BP 80's sys HR 100.  Family has been at bedside, emotional support given to them. Decision made to tansfer pt to med-surg floor.

## 2015-09-04 DIAGNOSIS — I5033 Acute on chronic diastolic (congestive) heart failure: Secondary | ICD-10-CM | POA: Diagnosis present

## 2015-09-04 NOTE — Progress Notes (Signed)
Progress Note   [name] VWP:[id number] DOB: 15-Nov-1919 DOA: 09/12/2015 PCP: Gildardo Cranker, DO   Brief Narrative:   81 y.o. female with a PMH of dementia, chronic diastolic CHF (EF 37-48 percent, grade 1 diastolic dysfunction on echo 08/05/15), stage IV CK D, recent hospitalization 08/20/15-08/25/15 for treatment of decompensated CHF and healthcare associated pneumonia who was readmitted 09/24/2015 with fever and cough. Chest x-ray done on admission showed worsening bilateral infiltrates.  Assessment/Plan:   Principal Problem:   Acute respiratory failure with hypoxia and secondary to acute on chronic diastolic CHF and HCAP - please see treatment details below for HCAP (was on broad spectrum ABX but now d/c due to progressive decline) - has been off IVF due to acute on chronic diastolic CHF  - pt on NRB at this time, comfortable     HCAP (healthcare-associated pneumonia) rule out aspiration pneumonia / dental caries / toothache - Patient has been hospitalized 3 times with respiratory symptoms and pneumonia in the past 3 months. - poor clinical response to ABX, after discussion with family, desire is to focus on comfort and place pt on Morphine drip - family at bedside, pt still on morphine drip and requiring NRB for comfort, oxygen level at target range on NRB   Active Problems:   Bladder prolapse, female, acquired - Evaluated by urologist during previous admission. Has refused pessary. - no further interventions in order to ensure comfort     GERD  - Given history of multiple courses of antibiotics and high risk for C. difficile infection, D/C PPI.     Alzheimer's disease - Supportive care per nursing staff.    Acute kidney injury ([place]) in the setting of Chronic kidney disease (CKD), stage IV (severe) (HCC) - Creatinine rising. Baseline creatinine 2.5-2.6. Current creatinine 2.92.  - no further blood work or fluids in order to respect pt and family wishes of full comfort    Hyponatremia - Likely secondary to dehydration.  - no further blood work as noted above     Acute on Chronic diastolic CHF (congestive heart failure) ([place]) - IVF have been stopped, avoiding injections at this time to ensure comfort per family request - pt unable to take PO due to lethargy     DVT Prophylaxis - Lovenox was given but can hold to ensure comfort   Family Communication: Son and family at bedside  Disposition Plan: ? [facility]  Code Status:     Code Status Orders        Start     Ordered   09/15/2015 0921  Do not attempt resuscitation (DNR)   Continuous    Question Answer Comment  In the event of cardiac or respiratory ARREST Do not call a "code blue"   In the event of cardiac or respiratory ARREST Do not perform Intubation, CPR, defibrillation or ACLS   In the event of cardiac or respiratory ARREST Use medication by any route, position, wound care, and other measures to relive pain and suffering. May use oxygen, suction and manual treatment of airway obstruction as needed for comfort.      09/24/2015 0920    Advance Directive Documentation        Most Recent Value   Type of Advance Directive  Out of facility DNR (pink MOST or yellow form)   Pre-existing out of facility DNR order (yellow form or pink MOST form)  Yellow form placed in chart (order not valid for inpatient use)   "MOST"  Form in Place?          IV Access:    Peripheral IV   Procedures and diagnostic studies:   Dg Chest Port 1 View  Sep 06, 2015   CLINICAL DATA:  81 year old female with shortness of breath and past history of congestive heart failure  EXAM: PORTABLE CHEST 1 VIEW  COMPARISON:  Prior chest x-ray 09/26/2015  FINDINGS: Extensive bilateral interstitial and airspace opacities which are increased compared to 09/15/2015. The cardiac and mediastinal contours are largely obscured by the overlying pulmonary opacities. Atherosclerotic calcifications are present in the transverse aorta. No acute  osseous abnormality.  IMPRESSION: Marked bilateral pulmonary edema concerning for CHF versus flash pulmonary edema.  Aortic atherosclerosis.   Electronically Signed   By: Jacqulynn Cadet M.D.   On: 09/06/15 20:00     Medical Consultants:    Palliative Care  Anti-Infectives:   Anti-infectives    Start     Dose/Rate Route Frequency Ordered Stop   2015/09/06 1200  vancomycin (VANCOCIN) IVPB 1000 mg/200 mL premix  Status:  Discontinued     1,000 mg 200 mL/hr over 60 Minutes Intravenous Every 48 hours 09-06-15 1105 09/03/15 0644   Sep 06, 2015 0800  vancomycin (VANCOCIN) IVPB 1000 mg/200 mL premix  Status:  Discontinued     1,000 mg 200 mL/hr over 60 Minutes Intravenous Every 48 hours 09/28/2015 1002 September 06, 2015 0758   09/17/2015 1400  piperacillin-tazobactam (ZOSYN) IVPB 2.25 g  Status:  Discontinued     2.25 g 100 mL/hr over 30 Minutes Intravenous 3 times per day 09/26/2015 1002 09/03/15 0641   09/20/2015 0615  vancomycin (VANCOCIN) IVPB 1000 mg/200 mL premix     1,000 mg 200 mL/hr over 60 Minutes Intravenous  Once 09/01/2015 0602 09/06/2015 0750   09/01/2015 0615  piperacillin-tazobactam (ZOSYN) IVPB 3.375 g     3.375 g 100 mL/hr over 30 Minutes Intravenous  Once 09/08/2015 0602 09/25/2015 0717      Subjective:   On NRB, difficult to arouse   Objective:    Filed Vitals:   09/03/15 1100 09/03/15 1452 09/03/15 1841 09/03/15 2037  BP:  85/26 82/47 91/43   Pulse: 80 97 101 103  Temp:  99.2 F (37.3 C)  99.5 F (37.5 C)  TempSrc:  Axillary  Axillary  Resp: 8 13 11 8   Height:      Weight:      SpO2:  99% 99% 98%    Intake/Output Summary (Last 24 hours) at 09/04/15 1235 Last data filed at 09/03/15 1800  Gross per 24 hour  Intake     60 ml  Output     15 ml  Net     45 ml   Filed Weights   09-06-2015 1018  Weight: 65.5 kg (144 lb 6.4 oz)    Exam: Gen:  NAD Oropharynx: Multiple dental caries, missing teeth, broken teeth Cardiovascular:  RRR, No M/R/G Respiratory:  Mild tachypnea but no  accessory muscle use Gastrointestinal:  Abdomen soft, NT/ND, + BS   Data Reviewed:    Labs: Basic Metabolic Panel:  Recent Labs Lab 08/30/2015 0552  09/14/2015 0558 09/01/15 0528 2015/09/06 0614  NA  --   --  130* 130* 131*  K  --   < > 4.4 4.4 4.4  CL  --   --  97* 98* 96*  CO2  --   --   --  25 24  GLUCOSE  --   --  101* 100* 100*  BUN  --   --  38* 41* 44*  CREATININE 2.57*  --  2.60* 2.63* 2.92*  CALCIUM  --   --   --  8.4* 8.6*  < > = values in this interval not displayed. GFR Estimated Creatinine Clearance: 9.9 mL/min (by C-G formula based on Cr of 2.92). Liver Function Tests:  Recent Labs Lab 09/11/2015 0552  AST 24  ALT 12*  ALKPHOS 45  BILITOT 0.2*  PROT 5.9*  ALBUMIN 2.2*    Recent Labs Lab 09/05/2015 0552  LIPASE 39   CBC:  Recent Labs Lab 09/10/2015 0552 09/12/2015 0558 09/01/15 0528 09/02/15 0614  WBC 7.5  --  7.5 9.1  NEUTROABS 5.2  --   --   --   HGB 9.0* 9.5* 8.8* 9.6*  HCT 28.5* 28.0* 27.2* 29.8*  MCV 89.6  --  88.9 90.0  PLT 251  --  257 283   Anemia work up: No results for input(s): VITAMINB12, FOLATE, FERRITIN, TIBC, IRON, RETICCTPCT in the last 72 hours. Sepsis Labs:  Recent Labs Lab 09/11/2015 0552 09/01/15 0528 09/02/15 0614  WBC 7.5 7.5 9.1   Microbiology Recent Results (from the past 240 hour(s))  Blood culture (routine x 2)     Status: None (Preliminary result)   Collection Time: 09/10/2015  6:30 AM  Result Value Ref Range Status   Specimen Description BLOOD LEFT FOREARM  Final   Special Requests BOTTLES DRAWN AEROBIC AND ANAEROBIC 5ML  Final   Culture   Final    NO GROWTH 4 DAYS Performed at [facility]    Report Status PENDING  Incomplete  Blood culture (routine x 2)     Status: None (Preliminary result)   Collection Time: 09/09/2015  6:35 AM  Result Value Ref Range Status   Specimen Description BLOOD RIGHT FOREARM  Final   Special Requests BOTTLES DRAWN AEROBIC AND ANAEROBIC 5ML  Final   Culture   Final    NO  GROWTH 4 DAYS Performed at [facility]    Report Status PENDING  Incomplete     Medications:   . sodium chloride  3 mL Intravenous Q12H   Continuous Infusions: . sodium chloride 10 mL/hr at 09/02/15 2100  . morphine 3 mg/hr (09/04/15 0400)     LOS: 4 days   MAGICK-Emree Locicero  [facility]  09/04/2015, 12:35 PM

## 2015-09-04 NOTE — Clinical Documentation Improvement (Signed)
Internal Medicine  Can the diagnosis of Respiratory Failure be further specified?   Document Acuity - Acute, Chronic, Acute on Chronic  Document Inclusion Of - Hypoxia, Hypercapnia, Combination of Both  Other  Clinically Undetermined  Document any associated diagnoses/conditions.   Supporting Information: Pt noted on 09/03/15 to be having respiratory distress, periods of apnea, SOB, increased demand for oxygen, hypoxia, and CXR shows severe pulmonary edema.   Please exercise your independent, professional judgment when responding. A specific answer is not anticipated or expected.   Thank You,  [place] [phone]

## 2015-09-04 NOTE — Clinical Documentation Improvement (Signed)
Internal Medicine  Can the diagnosis of CHF be further specified?    Acuity - Acute, Chronic, Acute on Chronic  Other  Clinically Undetermined   Document any associated diagnoses/conditions   Supporting Information: Pt currently has a diagnosis of chronic CHF.  CXR on 09/02/15 reveals marked bilateral pulmonary edema concerning for CHF vs flash pulmonary edema.  Has pt now developed acute CHF?   Please exercise your independent, professional judgment when responding. A specific answer is not anticipated or expected.   Thank You,  [place] [phone]

## 2015-09-05 LAB — CULTURE, BLOOD (ROUTINE X 2)
Culture: NO GROWTH
Culture: NO GROWTH

## 2015-09-05 MED ORDER — SCOPOLAMINE 1 MG/3DAYS TD PT72
1.0000 | MEDICATED_PATCH | TRANSDERMAL | Status: DC
  Administered 2015-09-05: 1.5 mg via TRANSDERMAL
  Filled 2015-09-05: qty 1

## 2015-09-05 MED ORDER — FUROSEMIDE 10 MG/ML IJ SOLN: 20.0000 mg | Freq: Once | INTRAMUSCULAR | Status: DC

## 2015-09-05 NOTE — Progress Notes (Signed)
New order given by Dr. Doyle Askew to discontinue IV lasix and apply a scopolamine patch. Order placed. Vwilliams,rn.

## 2015-09-05 NOTE — Progress Notes (Signed)
Progress Note   [name] OZH:[id number] DOB: 1919/08/02 DOA: 09/25/2015 PCP: Gildardo Cranker, DO   Brief Narrative:   81 y.o. female with a PMH of dementia, chronic diastolic CHF (EF 62-95 percent, grade 1 diastolic dysfunction on echo 08/05/15), stage IV CK D, recent hospitalization 08/20/15-08/25/15 for treatment of decompensated CHF and healthcare associated pneumonia who was readmitted 09/10/2015 with fever and cough. Chest x-ray done on admission showed worsening bilateral infiltrates.  Assessment/Plan:   Principal Problem:   Acute respiratory failure with hypoxia and secondary to acute on chronic diastolic CHF and HCAP - please see treatment details below for HCAP (was on broad spectrum ABX but now d/c due to progressive decline) - has been off IVF due to acute on chronic diastolic CHF  - pt on NRB at this time, comfortable, unresponsive     HCAP (healthcare-associated pneumonia) rule out aspiration pneumonia / dental caries / toothache - Patient has been hospitalized 3 times with respiratory symptoms and pneumonia in the past 3 months. - poor clinical response to ABX, after discussion with family, keep on Morphine drip - family at bedside, pt still on morphine drip and requiring NRB for comfort - place on scopolamine patch for better control of secretions   Active Problems:   Bladder prolapse, female, acquired - Evaluated by urologist during previous admission. Has refused pessary. - no further interventions in order to ensure comfort     GERD  - Given history of multiple courses of antibiotics and high risk for C. difficile infection, D/C PPI.     Alzheimer's disease - Supportive care per nursing staff.    Acute kidney injury ([place]) in the setting of Chronic kidney disease (CKD), stage IV (severe) (HCC) - Creatinine rising. Baseline creatinine 2.5-2.6. Current creatinine 2.92.  - no further blood work or fluids in order to respect pt and family wishes of full comfort    Hyponatremia - Likely secondary to dehydration.  - no further blood work as noted above     Acute on Chronic diastolic CHF (congestive heart failure) ([place]) - IVF have been stopped, avoiding injections (lasix) at this time to ensure comfort per family request - pt unable to take PO due to lethargy     DVT Prophylaxis - Lovenox was given but can hold to ensure comfort   Family Communication: Son and family at bedside  Disposition Plan: ? [place] [facility]  Code Status: DNR  IV Access:    Peripheral IV  Procedures and diagnostic studies:    Dg Chest Port 1 View 09-12-15  Marked bilateral pulmonary edema concerning for CHF, pulmonary edema.  Medical Consultants:    Palliative Care  Anti-Infectives:   Anti-infectives    Start     Dose/Rate Route Frequency Ordered Stop   Sep 12, 2015 1200  vancomycin (VANCOCIN) IVPB 1000 mg/200 mL premix  Status:  Discontinued     1,000 mg 200 mL/hr over 60 Minutes Intravenous Every 48 hours 09/12/15 1105 09/03/15 0644   Sep 12, 2015 0800  vancomycin (VANCOCIN) IVPB 1000 mg/200 mL premix  Status:  Discontinued     1,000 mg 200 mL/hr over 60 Minutes Intravenous Every 48 hours 09/03/2015 1002 2015-09-12 0758   09/10/2015 1400  piperacillin-tazobactam (ZOSYN) IVPB 2.25 g  Status:  Discontinued     2.25 g 100 mL/hr over 30 Minutes Intravenous 3 times per day 09/05/2015 1002 09/03/15 0641   09/18/2015 0615  vancomycin (VANCOCIN) IVPB 1000 mg/200 mL premix     1,000 mg 200 mL/hr  over 60 Minutes Intravenous  Once 09/27/2015 0602 09/01/2015 0750   09/11/2015 0615  piperacillin-tazobactam (ZOSYN) IVPB 3.375 g     3.375 g 100 mL/hr over 30 Minutes Intravenous  Once 09/06/2015 0602 09/28/2015 0717      Subjective:   On NRB, unresponsive   Objective:    Filed Vitals:   09/03/15 1452 09/03/15 1841 09/03/15 2037 09/04/15 1541  BP: 85/26 82/47 91/43  92/32  Pulse: 97 101 103 100  Temp: 99.2 F (37.3 C)  99.5 F (37.5 C)   TempSrc: Axillary  Axillary   Resp: 13 11 8      Height:      Weight:      SpO2: 99% 99% 98%    No intake or output data in the 24 hours ending 09/05/15 1055 Filed Weights   09/02/15 1018  Weight: 65.5 kg (144 lb 6.4 oz)    Exam: Gen:  NAD Oropharynx: Multiple dental caries, missing teeth, broken teeth Cardiovascular:  RRR, No M/R/G Respiratory:  no accessory muscle use Gastrointestinal:  Abdomen soft, NT/ND, + BS   Data Reviewed:    Labs: Basic Metabolic Panel:  Recent Labs Lab 09/24/2015 0552  09/10/2015 0558 09/01/15 0528 09/02/15 0614  NA  --   --  130* 130* 131*  K  --   < > 4.4 4.4 4.4  CL  --   --  97* 98* 96*  CO2  --   --   --  25 24  GLUCOSE  --   --  101* 100* 100*  BUN  --   --  38* 41* 44*  CREATININE 2.57*  --  2.60* 2.63* 2.92*  CALCIUM  --   --   --  8.4* 8.6*  < > = values in this interval not displayed. GFR Estimated Creatinine Clearance: 9.9 mL/min (by C-G formula based on Cr of 2.92). Liver Function Tests:  Recent Labs Lab 09/02/2015 0552  AST 24  ALT 12*  ALKPHOS 45  BILITOT 0.2*  PROT 5.9*  ALBUMIN 2.2*    Recent Labs Lab 09/26/2015 0552  LIPASE 39   CBC:  Recent Labs Lab 09/23/2015 0552 09/10/2015 0558 09/01/15 0528 09/02/15 0614  WBC 7.5  --  7.5 9.1  NEUTROABS 5.2  --   --   --   HGB 9.0* 9.5* 8.8* 9.6*  HCT 28.5* 28.0* 27.2* 29.8*  MCV 89.6  --  88.9 90.0  PLT 251  --  257 283   Anemia work up: No results for input(s): VITAMINB12, FOLATE, FERRITIN, TIBC, IRON, RETICCTPCT in the last 72 hours. Sepsis Labs:  Recent Labs Lab 09/15/2015 0552 09/01/15 0528 09/02/15 0614  WBC 7.5 7.5 9.1   Microbiology Recent Results (from the past 240 hour(s))  Blood culture (routine x 2)     Status: None   Collection Time: 09/21/2015  6:30 AM  Result Value Ref Range Status   Specimen Description BLOOD LEFT FOREARM  Final   Special Requests BOTTLES DRAWN AEROBIC AND ANAEROBIC 5ML  Final   Culture   Final    NO GROWTH 5 DAYS Performed at [facility]    Report Status  09/05/2015 FINAL  Final  Blood culture (routine x 2)     Status: None   Collection Time: 09/06/2015  6:35 AM  Result Value Ref Range Status   Specimen Description BLOOD RIGHT FOREARM  Final   Special Requests BOTTLES DRAWN AEROBIC AND ANAEROBIC 5ML  Final   Culture   Final  NO GROWTH 5 DAYS Performed at [facility]    Report Status 09/05/2015 FINAL  Final     Medications:   . scopolamine  1 patch Transdermal Q72H  . sodium chloride  3 mL Intravenous Q12H   Continuous Infusions: . sodium chloride 10 mL/hr at 09/02/15 2100  . morphine 5 mg/hr (09/04/15 1444)     LOS: 5 days   MAGICK-Recardo Linn  [facility]  09/05/2015, 10:55 AM

## 2015-09-06 DIAGNOSIS — I5033 Acute on chronic diastolic (congestive) heart failure: Secondary | ICD-10-CM

## 2015-09-06 DIAGNOSIS — J189 Pneumonia, unspecified organism: Principal | ICD-10-CM

## 2015-09-06 DIAGNOSIS — Z515 Encounter for palliative care: Secondary | ICD-10-CM | POA: Insufficient documentation

## 2015-09-06 MED ORDER — FUROSEMIDE 10 MG/ML IJ SOLN
20.0000 mg | Freq: Once | INTRAMUSCULAR | Status: AC
  Administered 2015-09-06: 20 mg via INTRAVENOUS
  Filled 2015-09-06: qty 2

## 2015-09-06 MED ORDER — ACETAMINOPHEN 10 MG/ML IV SOLN
1000.0000 mg | Freq: Once | INTRAVENOUS | Status: AC
  Administered 2015-09-06: 1000 mg via INTRAVENOUS
  Filled 2015-09-06: qty 100

## 2015-09-06 NOTE — Clinical Social Work Note (Signed)
Clinical Social Work Assessment  Patient Details  Name: [name] MRN: [id number] Date of Birth: 06-Jul-1919  Date of referral:  09/09/2015               Reason for consult:  Facility Placement                Permission sought to share information with:  Family Supports Permission granted to share information::  Yes, Verbal Permission Granted  Name::     Reubin Milan  Agency::     Relationship::  Son  Sport and exercise psychologist Information:     Housing/Transportation Living arrangements for the past 2 months:  [place] of Information:  Patient, Adult Children Patient Interpreter Needed:  None Criminal Activity/Legal Involvement Pertinent to Current Situation/Hospitalization:  No - Comment as needed Significant Relationships:  Adult Children Lives with:  Facility Resident Do you feel safe going back to the place where you live?  Yes Need for family participation in patient care:  Yes (Comment)  Care giving concerns:  Pt's son is concerned that pt may not survive hospital stay   Social Worker assessment / plan:  CSW met with pt and her son at bedside.  Pt has been unresponsive and on morphine drip.  CSW met with pt's son and encouraged him to discuss his thoughts and feelings related to pt health and discharge needs. CSW provided supportive listening and discussed possible discharge plans.  CSW will continue to meet with pt and son to determine discharge needs.     Employment status:  Retired Forensic scientist:  Medicare PT Recommendations:  [place] / Referral to community resources:  Acute Rehab  Patient/Family's Response to care:  Pt unresponsive.  Pt's son discussed pt being in a "morphine coma".  Pt's son discussed pt's recent health battles and his frustration with the communication at pt's ALF.  Pt's son discussed MD communication stating that he has been told pt is not stable for transport and doctors had told him while pt was in ICU that she  had hours to live.  Pt's son stated he was "tired" as he had been sleeping at the hospital past two days.  Pt's son grateful for CSW support and unsure if there will be any discharge needs for client as he is expecting a hospital death.   Patient/Family's Understanding of and Emotional Response to Diagnosis, Current Treatment, and Prognosis:  Pt's son sad and tired but appears to understand what has been happening medically for pt.    Emotional Assessment Appearance:  Appears stated age Attitude/Demeanor/Rapport:   (pleasant, interactive) Affect (typically observed):  Calm, Pleasant Orientation:  Oriented to Self, Oriented to Place, Oriented to Situation Alcohol / Substance use:  Never Used Psych involvement (Current and /or in the community):  No (Comment)  Discharge Needs  Concerns to be addressed:  Discharge Planning Concerns Readmission within the last 30 days:  Yes Current discharge risk:  Physical Impairment Barriers to Discharge:  No Barriers Identified   Carlean Jews, LCSW 09/06/2015, 8:58 AM

## 2015-09-06 NOTE — Consult Note (Signed)
Consultation Note Date: 09/06/2015   Patient Name: [name]  DOB: [birth year]  MRN: [id number]  Age / Sex: 81 y.o., female   PCP: Gildardo Cranker, DO Referring Physician: Theodis Blaze, MD  Reason for Consultation: Establishing goals of care  Palliative Care Assessment and Plan Summary of Established Goals of Care and Medical Treatment Preferences   81 y.o. female with a PMH of dementia, chronic diastolic CHF (EF 78-93 percent, grade 1 diastolic dysfunction on echo 08/05/15), stage IV CK D, recent hospitalization 08/20/15-08/25/15 for treatment of decompensated CHF and healthcare associated pneumonia who was readmitted 09/10/2015 with fever and cough. Chest x-ray done on admission showed worsening bilateral infiltrates. Patient has been admitted with acute hypoxic respiratory failure due to HCAP versus aspiration PNA, acute on chronic diastolic CHF. The patient is now on comfort measures, she is on Morphine infusion and unresponsive. Palliative consult has been placed for continuation of comfort measures and also to see if the patient ought to be transitioned to inpatient hospice.  The patient is unresponsive, she is on venti mask, her daughter in law is in the room, the patient's son stayed with her last night and has currently gone home to rest. The patient has one son. She is widowed. She was in relatively good health 6 weeks ago. She is now actively dying. Daughter states that her husband is at peace with his mother passing away. Discussed appropriateness of using Morphine at this stage, discussed end of life signs and symptoms. All questions answered.   Thank you for the consult,continue comfort measures, will discuss with patient's son in am about hospice consult and transfer to inpatient hospice. Prognosis appears hours to some very limited number of days, likely hospital death.   Contacts/Participants in Discussion: Primary Decision Maker:  Patient's son who is an only child HCPOA: yes      Code Status/Advance Care Planning:  DNR DNI  Symptom Management:   Continue morphine infusion, death is imminent, prognosis hours to days discussed with daughter in law present in the room.  Palliative Prophylaxis: patient at end of life, will continue to monitor.  Additional Recommendations (Limitations, Scope, Preferences): Will approach [facility] with son if patient lingers by 09/15/15 Psycho-social/Spiritual:   Support System: yes, son.   Desire for further Chaplaincy support:no  Prognosis: Hours - Days  Discharge Planning:  likely hospital death   Values: comfort at end of life  Life limiting illness: Acute respiratory failure with hypoxia and secondary to acute on chronic diastolic CHF and HCAP      Chief Complaint/History of Present Illness:  Fever cough   Primary Diagnoses  Present on Admission:  . HCAP (healthcare-associated pneumonia) . Alzheimer's disease . Bladder prolapse, female, acquired . Chronic kidney disease (CKD), stage IV (severe) ([place]) . Hyponatremia . AKI (acute kidney injury) ([place]) . GERD (gastroesophageal reflux disease) . Dental caries . Acute on chronic diastolic heart failure (HCC)  Palliative Review of Systems: Completed  I have reviewed the medical record, interviewed the patient and family, and examined the patient. The following aspects are pertinent.  Past Medical History  Diagnosis Date  . Prolapsed bladder   . Rectal prolapse   . Hyperlipidemia   . Bladder prolapse, female, acquired 06/27/2012  . Prolapsed internal hemorrhoids 06/27/2012  . Unspecified vitamin D deficiency   . Alzheimer's disease   . Anemia   . Personal history of fall   . Rectal polyp, excised 11/22/2012 01/01/2013  . Chronic kidney disease (CKD), stage III (  moderate) 06/27/2012  . Hydronephrosis    Social History   Social History  . Marital Status: Widowed    Spouse Name: N/A  . Number of Children: N/A  . Years of Education: N/A   Social  History Main Topics  . Smoking status: Never Smoker   . Smokeless tobacco: Never Used  . Alcohol Use: No  . Drug Use: No  . Sexual Activity: No   Other Topics Concern  . None   Social History Narrative   Family History  Problem Relation Age of Onset  . Cancer Sister     breast  . Heart attack Father    Scheduled Meds: . scopolamine  1 patch Transdermal Q72H  . sodium chloride  3 mL Intravenous Q12H   Continuous Infusions: . sodium chloride 10 mL/hr at 09/02/15 2100  . morphine 7 mg/hr (09/06/15 0722)   PRN Meds:.acetaminophen, hyoscyamine, LORazepam Medications Prior to Admission:  Prior to Admission medications   Medication Sig Start Date End Date Taking? Authorizing Provider  aspirin EC 81 MG tablet Take 81 mg by mouth daily.   Yes Historical Provider, MD  calcium carbonate (OS-CAL - DOSED IN MG OF ELEMENTAL CALCIUM) 1250 (500 CA) MG tablet Take 1 tablet by mouth daily.   Yes Historical Provider, MD  docusate sodium (COLACE) 100 MG capsule Take 100 mg by mouth daily.   Yes Historical Provider, MD  feeding supplement, ENSURE ENLIVE, (ENSURE ENLIVE) LIQD Take 237 mLs by mouth 2 (two) times daily between meals. 08/08/15  Yes Venetia Maxon Rama, MD  Multiple Vitamin (MULTIVITAMIN WITH MINERALS) TABS Take 1 tablet by mouth daily.   Yes Historical Provider, MD   No Known Allergies CBC:    Component Value Date/Time   WBC 9.1 09/02/2015 0614   WBC 6.6 10/15/2014 1213   HGB 9.6* 09/02/2015 0614   HCT 29.8* 09/02/2015 0614   PLT 283 09/02/2015 0614   MCV 90.0 09/02/2015 0614   NEUTROABS 5.2 08/30/2015 0552   NEUTROABS 4.0 10/15/2014 1213   LYMPHSABS 1.1 09/04/2015 0552   LYMPHSABS 2.0 10/15/2014 1213   MONOABS 0.7 09/25/2015 0552   EOSABS 0.5 09/14/2015 0552   EOSABS 0.1 10/15/2014 1213   BASOSABS 0.0 08/30/2015 0552   BASOSABS 0.0 10/15/2014 1213   Comprehensive Metabolic Panel:    Component Value Date/Time   NA 131* 09/02/2015 0614   NA 137 05/08/2015 1235   K 4.4  09/02/2015 0614   CL 96* 09/02/2015 0614   CO2 24 09/02/2015 0614   BUN 44* 09/02/2015 0614   BUN 46* 05/08/2015 1235   CREATININE 2.92* 09/02/2015 0614   GLUCOSE 100* 09/02/2015 0614   GLUCOSE 132* 05/08/2015 1235   CALCIUM 8.6* 09/02/2015 0614   AST 24 08/29/2015 0552   ALT 12* 09/11/2015 0552   ALKPHOS 45 09/09/2015 0552   BILITOT 0.2* 09/02/2015 0552   BILITOT 0.2 05/08/2015 1235   PROT 5.9* 08/29/2015 0552   PROT 6.3 05/08/2015 1235   ALBUMIN 2.2* 09/22/2015 0552   ALBUMIN 3.3 05/08/2015 1235    Physical Exam: Vital Signs: BP 101/35 mmHg  Pulse 89  Temp(Src) 101.9 F (38.8 C) (Axillary)  Resp 16  Ht 5' (1.524 m)  Wt 65.5 kg (144 lb 6.4 oz)  BMI 28.20 kg/m2  SpO2 100% SpO2: SpO2: 100 % O2 Device: O2 Device: NRB O2 Flow Rate: O2 Flow Rate (L/min): 2 L/min Intake/output summary:  Intake/Output Summary (Last 24 hours) at 09/06/15 1507 Last data filed at 09/05/15 1700  Gross per  24 hour  Intake      0 ml  Output      0 ml  Net      0 ml   LBM: Last BM Date: 09/01/15 Baseline Weight: Weight: 65.5 kg (144 lb 6.4 oz) Most recent weight: Weight: 65.5 kg (144 lb 6.4 oz)  Exam Findings:  Gen: unresponsive Cardiovascular: RRR  Respiratory: no accessory muscle use, crackles at bases shallow breathing Gastrointestinal: Abdomen soft,   LE pale, some edema no coolness no mottling.                        Palliative Performance Scale:10 Additional Data Reviewed: No results for input(s): WBC, HGB, PLT, NA, BUN, CREATININE in the last 72 hours.  Invalid input(s): ALB   Time In:  1300 Time Out: 1400 Time Total: 60 min  Greater than 50%  of this time was spent counseling and coordinating care related to the above assessment and plan.  Signed by: Loistine Chance, MD [id number]  Loistine Chance, MD  09/06/2015, 3:07 PM  Please contact Palliative Medicine Team phone at [phone] for questions and concerns.

## 2015-09-06 NOTE — Progress Notes (Signed)
Progress Note   [name] HGD:[id number] DOB: Mar 11, 1919 DOA: 09/26/2015 PCP: Gildardo Cranker, DO   Brief Narrative:   81 y.o. female with a PMH of dementia, chronic diastolic CHF (EF 96-22 percent, grade 1 diastolic dysfunction on echo 08/05/15), stage IV CK D, recent hospitalization 08/20/15-08/25/15 for treatment of decompensated CHF and healthcare associated pneumonia who was readmitted 09/14/2015 with fever and cough. Chest x-ray done on admission showed worsening bilateral infiltrates.  Assessment/Plan:   Principal Problem:   Acute respiratory failure with hypoxia and secondary to acute on chronic diastolic CHF and HCAP - please see treatment details below for HCAP (was on broad spectrum ABX but now d/c due to progressive decline) - has been off IVF due to acute on chronic diastolic CHF  - pt on NRB at this time, comfortable, unresponsive  - ? Is dilaudid drip better with pt's known CKD     Sepsis secondary to HCAP (healthcare-associated pneumonia) ? aspiration pneumonia / dental caries / toothache - Patient has been hospitalized 3 times with respiratory symptoms and pneumonia in the past 3 months. - poor clinical response to ABX, after discussion with family, keep on Morphine drip - family at bedside, pt still on morphine drip, febrile this AM - will ask pharmacy to give tylenol IV - PCT consulted to assist with comfort care, pt seem more uncomfortable this AM  - ? D/c to [facility]   Active Problems:   Bladder prolapse, female, acquired - Evaluated by urologist during previous admission. Has refused pessary. - no further interventions in order to ensure comfort  - son asked to avoid supp to ensure comfort     GERD  - Given history of multiple courses of antibiotics and high risk for C. difficile infection, D/C PPI.     Alzheimer's disease - Supportive care per nursing staff.    Acute kidney injury ([place]) in the setting of Chronic kidney disease (CKD), stage IV (severe)  (HCC) - Creatinine rising. Baseline creatinine 2.5-2.6. Current creatinine 2.92.  - no further blood work or fluids in order to respect pt and family wishes of full comfort     Hyponatremia - Likely secondary to dehydration.  - no further blood work as noted above     Acute on Chronic diastolic CHF (congestive heart failure) ([place]) - IVF have been stopped, pt with more crackles this AM, will give low dose of Lasix this AM - pt unable to take PO due to lethargy     DVT Prophylaxis - Lovenox was given but can hold to ensure comfort   Family Communication: Son and family at bedside  Disposition Plan: ? [facility]  Code Status: DNR  IV Access:    Peripheral IV  Procedures and diagnostic studies:    Dg Chest Port 1 View 2015/09/08  Marked bilateral pulmonary edema concerning for CHF, pulmonary edema.  Medical Consultants:    Palliative Care  Anti-Infectives:   Anti-infectives    Start     Dose/Rate Route Frequency Ordered Stop   09/08/2015 1200  vancomycin (VANCOCIN) IVPB 1000 mg/200 mL premix  Status:  Discontinued     1,000 mg 200 mL/hr over 60 Minutes Intravenous Every 48 hours 2015/09/08 1105 09/03/15 0644   2015/09/08 0800  vancomycin (VANCOCIN) IVPB 1000 mg/200 mL premix  Status:  Discontinued     1,000 mg 200 mL/hr over 60 Minutes Intravenous Every 48 hours 09/25/2015 1002 09-08-15 0758   09/04/2015 1400  piperacillin-tazobactam (ZOSYN) IVPB 2.25 g  Status:  Discontinued     2.25 g 100 mL/hr over 30 Minutes Intravenous 3 times per day 09/20/2015 1002 09/03/15 0641   09/05/2015 0615  vancomycin (VANCOCIN) IVPB 1000 mg/200 mL premix     1,000 mg 200 mL/hr over 60 Minutes Intravenous  Once 09/02/2015 0602 09/27/2015 0750   09/15/2015 0615  piperacillin-tazobactam (ZOSYN) IVPB 3.375 g     3.375 g 100 mL/hr over 30 Minutes Intravenous  Once 09/06/2015 0602 09/09/2015 0717      Subjective:   On NRB, unresponsive   Objective:    Filed Vitals:   09/03/15 1841 09/03/15 2037 09/04/15  1541 09/06/15 0654  BP: 82/47 91/43 92/32  101/35  Pulse: 101 103 100 89  Temp:  99.5 F (37.5 C)  101.9 F (38.8 C)  TempSrc:  Axillary  Axillary  Resp: 11 8  16   Height:      Weight:      SpO2: 99% 98%  100%    Intake/Output Summary (Last 24 hours) at 09/06/15 1342 Last data filed at 09/05/15 1700  Gross per 24 hour  Intake      0 ml  Output      0 ml  Net      0 ml   Filed Weights   09/02/15 1018  Weight: 65.5 kg (144 lb 6.4 oz)    Exam: Gen:  NAD Cardiovascular:  RRR, No M/R/G Respiratory:  no accessory muscle use, crackles at bases  Gastrointestinal:  Abdomen soft, NT/ND, + BS   Data Reviewed:    Labs: Basic Metabolic Panel:  Recent Labs Lab 09/21/2015 0552  08/30/2015 0558 09/01/15 0528 09/02/15 0614  NA  --   --  130* 130* 131*  K  --   < > 4.4 4.4 4.4  CL  --   --  97* 98* 96*  CO2  --   --   --  25 24  GLUCOSE  --   --  101* 100* 100*  BUN  --   --  38* 41* 44*  CREATININE 2.57*  --  2.60* 2.63* 2.92*  CALCIUM  --   --   --  8.4* 8.6*  < > = values in this interval not displayed. GFR Estimated Creatinine Clearance: 9.9 mL/min (by C-G formula based on Cr of 2.92). Liver Function Tests:  Recent Labs Lab 09/02/2015 0552  AST 24  ALT 12*  ALKPHOS 45  BILITOT 0.2*  PROT 5.9*  ALBUMIN 2.2*    Recent Labs Lab 09/24/2015 0552  LIPASE 39   CBC:  Recent Labs Lab 09/04/2015 0552 09/22/2015 0558 09/01/15 0528 09/02/15 0614  WBC 7.5  --  7.5 9.1  NEUTROABS 5.2  --   --   --   HGB 9.0* 9.5* 8.8* 9.6*  HCT 28.5* 28.0* 27.2* 29.8*  MCV 89.6  --  88.9 90.0  PLT 251  --  257 283   Anemia work up: No results for input(s): VITAMINB12, FOLATE, FERRITIN, TIBC, IRON, RETICCTPCT in the last 72 hours. Sepsis Labs:  Recent Labs Lab 09/26/2015 0552 09/01/15 0528 09/02/15 0614  WBC 7.5 7.5 9.1   Microbiology Recent Results (from the past 240 hour(s))  Blood culture (routine x 2)     Status: None   Collection Time: 09/21/2015  6:30 AM  Result Value  Ref Range Status   Specimen Description BLOOD LEFT FOREARM  Final   Special Requests BOTTLES DRAWN AEROBIC AND ANAEROBIC 5ML  Final   Culture   Final  NO GROWTH 5 DAYS Performed at [facility]    Report Status 09/05/2015 FINAL  Final  Blood culture (routine x 2)     Status: None   Collection Time: 09/17/2015  6:35 AM  Result Value Ref Range Status   Specimen Description BLOOD RIGHT FOREARM  Final   Special Requests BOTTLES DRAWN AEROBIC AND ANAEROBIC 5ML  Final   Culture   Final    NO GROWTH 5 DAYS Performed at [facility]    Report Status 09/05/2015 FINAL  Final     Medications:   . scopolamine  1 patch Transdermal Q72H  . sodium chloride  3 mL Intravenous Q12H   Continuous Infusions: . sodium chloride 10 mL/hr at 09/02/15 2100  . morphine 7 mg/hr (09/06/15 0722)     LOS: 6 days   MAGICK-MYERS, ISKRA  [facility]  09/06/2015, 1:42 PM

## 2015-09-07 DIAGNOSIS — G300 Alzheimer's disease with early onset: Secondary | ICD-10-CM

## 2015-09-29 NOTE — Discharge Summary (Signed)
Death Summary  [name] KJZ:[id number] DOB: 01-22-1919 DOA: September 04, 2015  PCP: Gildardo Cranker, DO PCP/Office notified: epic   Admit date: Sep 04, 2015 Date of Death: 11-Sep-2015  Final Diagnoses:  Principal Problem:   HCAP (healthcare-associated pneumonia) Active Problems:   Bladder prolapse, female, acquired   Alzheimer's disease   Chronic kidney disease (CKD), stage IV (severe) (HCC)   Hyponatremia   AKI (acute kidney injury) ([place])   GERD (gastroesophageal reflux disease)   Dental caries   Acute on chronic diastolic heart failure ([place])   Encounter for palliative care      History of present illness:  81 y.o. female with a PMH of dementia, chronic diastolic CHF (EF 94-80 percent, grade 1 diastolic dysfunction on echo 08/05/15), stage IV CK D, recent hospitalization 08/20/15-08/25/15 for treatment of decompensated CHF and healthcare associated pneumonia who was readmitted 09/04/2015 with fever and cough and recurrent pneumonias  -unfortunately, patient progressive declined.   Hospital Course:  1. Acute respiratory failure with hypoxia and secondary to acute on chronic diastolic CHF and HCAP - HCAP (was on broad spectrum ABX but now d/c due to progressive decline) 2. Sepsis secondary to HCAP (healthcare-associated pneumonia) ? aspiration pneumonia / dental caries / toothache - Patient has been hospitalized 3 times with respiratory symptoms and pneumonia in the past 3 months. - poor clinical response to ABX, after discussion with family, pt started on morphine drip 3.Alzheimer's disease 4. Acute kidney injury ([place]) in the setting of Chronic kidney disease (CKD), stage IV (severe) ([place]) 5. Acute on Chronic diastolic CHF (congestive heart failure) ([place])   Unfortunately, patient continued to decline with ongoing pneumonia, progressive heart failure, and renal failure with multiorgan dysfunction. Patient died on 2015/09/11 at 6.030 AM under comfort care. family at the bedside     Time: 6.30  AM  Signed:  Kinnie Feil  [facility] September 11, 2015, 9:48 AM

## 2015-09-29 NOTE — Progress Notes (Signed)
At [place] pt declared deceased by myself Vanita Ingles, RN and charge Systems analyst. Attending notified, pts son is in the room. Morphine drip turned off with waste of 100 ml's. Son awaiting other family rooms before proceeding.

## 2015-09-29 DEATH — deceased

## 2015-11-06 ENCOUNTER — Encounter: Payer: Medicare Other | Admitting: Internal Medicine

## 2016-09-28 IMAGING — CT CT CHEST W/O CM
2 of 4 series · 14 of 36 positions shown, 17 images · non-contrast
Comparison: Chest radiographs obtained earlier today.

CLINICAL DATA: Hypoxia.  Recently diagnosed with pneumonia.

EXAM:
CT CHEST WITHOUT CONTRAST
TECHNIQUE: Multidetector CT imaging of the chest was performed following the
standard protocol without IV contrast.

[Series 2: chest w/o st · axial · non-contrast · 0.61mm/px · z∈[+1277,+1437]mm · 11 of 38 slices shown, 14 images]
[im 3/38  mediastinal]
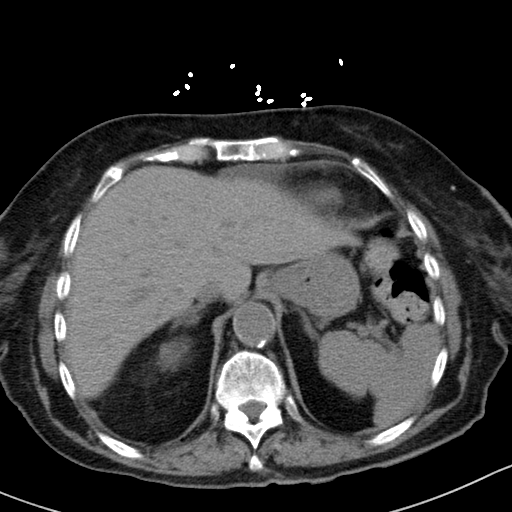
[im 3/38  lung]
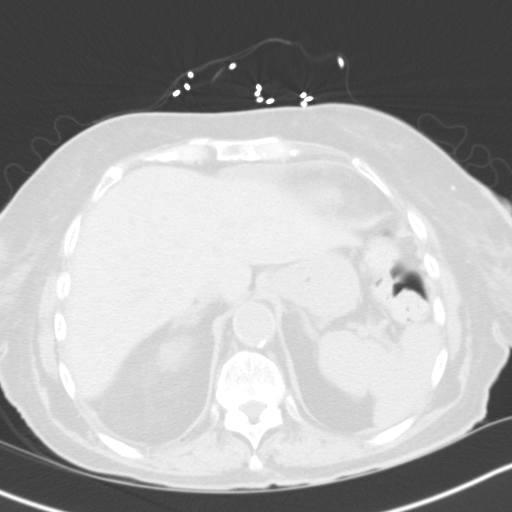
[im 6/38  lung]
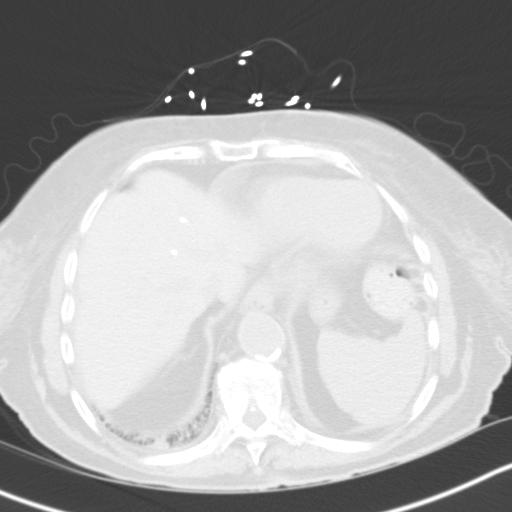
[im 9/38  lung]
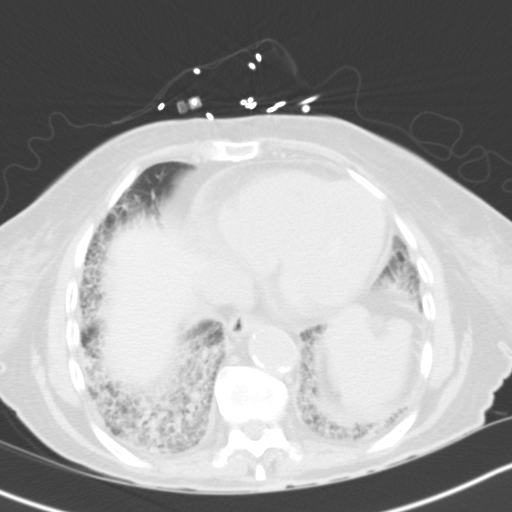
[im 12/38  lung]
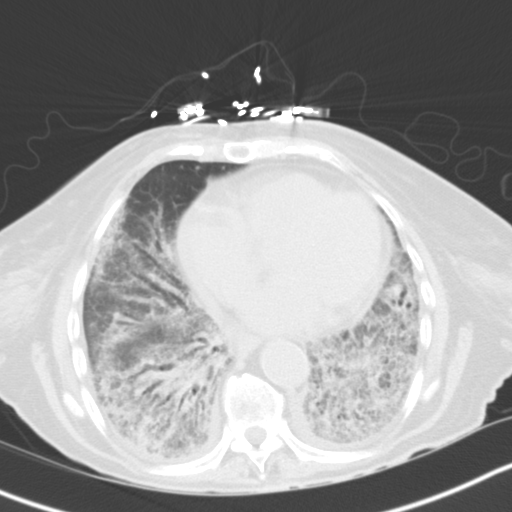
[im 15/38  mediastinal]
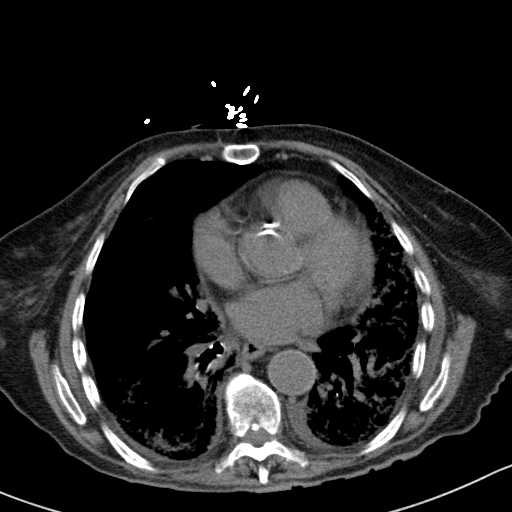
[im 15/38  lung]
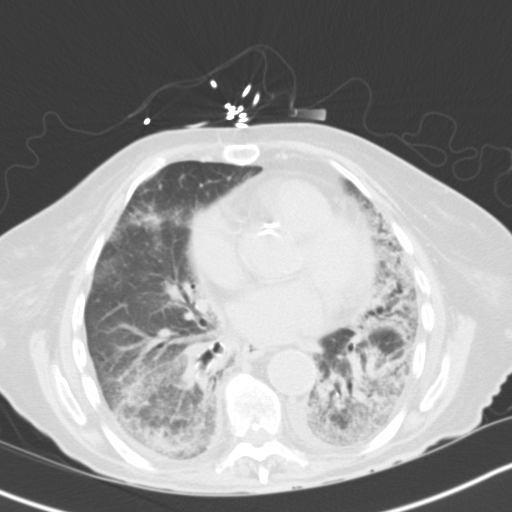
[im 20/38  lung]
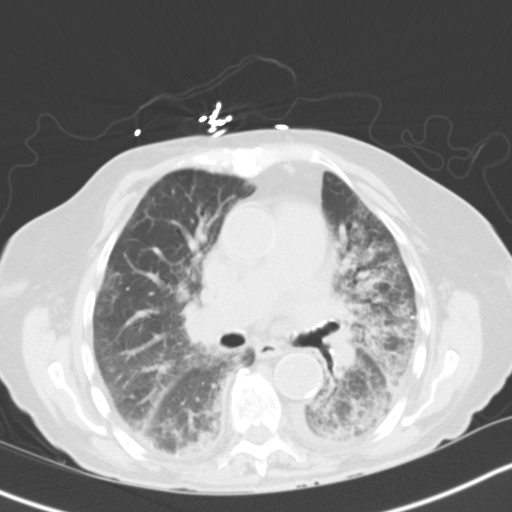
[im 23/38  lung]
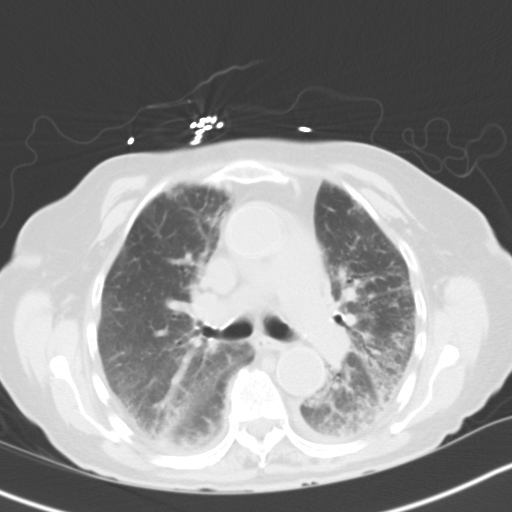
[im 26/38  lung]
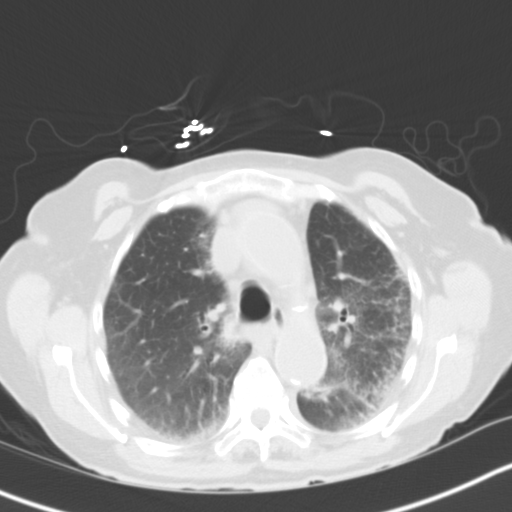
[im 29/38  mediastinal]
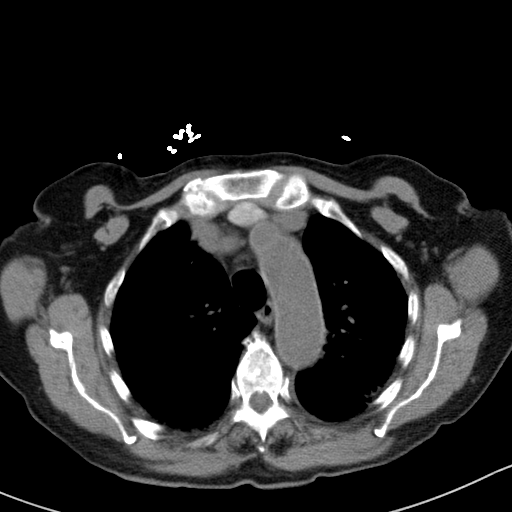
[im 29/38  lung]
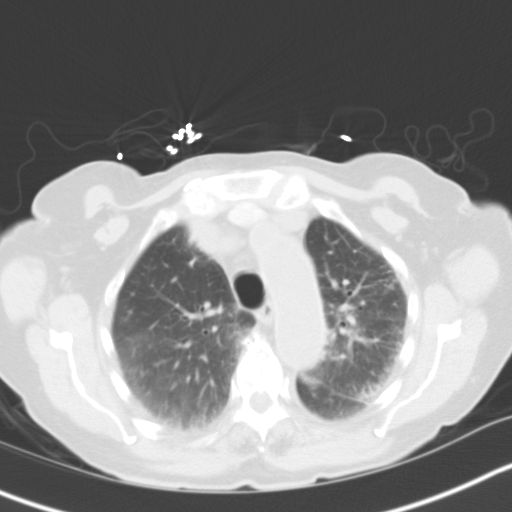
[im 32/38  lung]
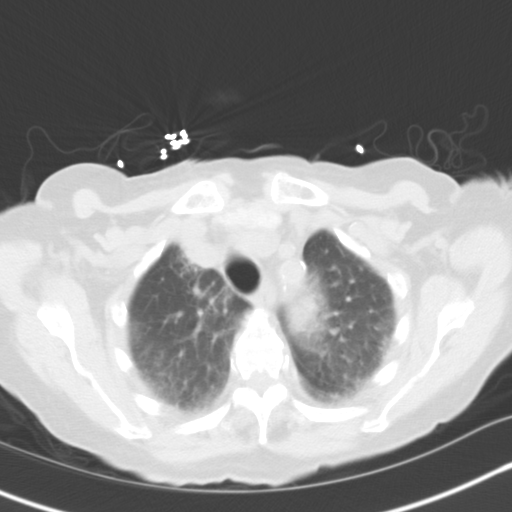
[im 35/38  lung]
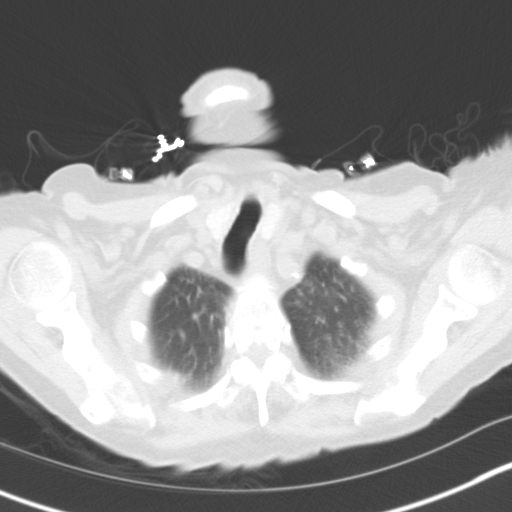

[Series 6: coronals · coronal · 0.38mm/px · 3 of 75 slices shown]
[im 15/75  lung]
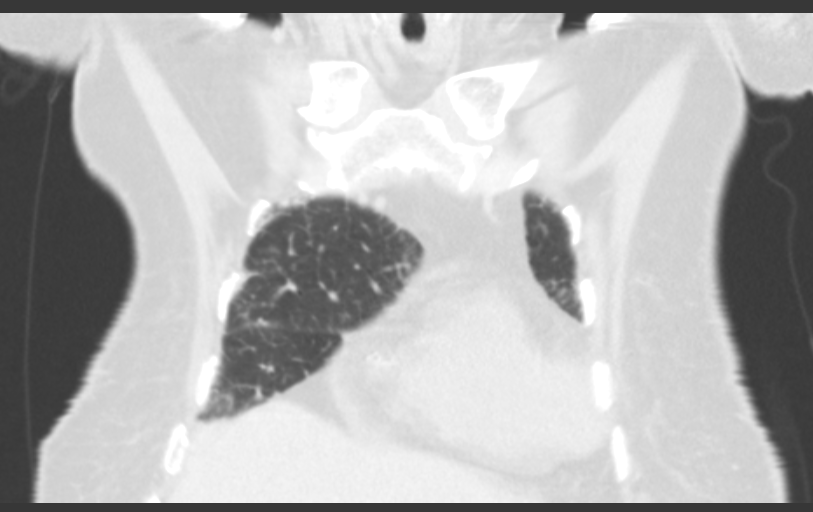
[im 30/75  lung]
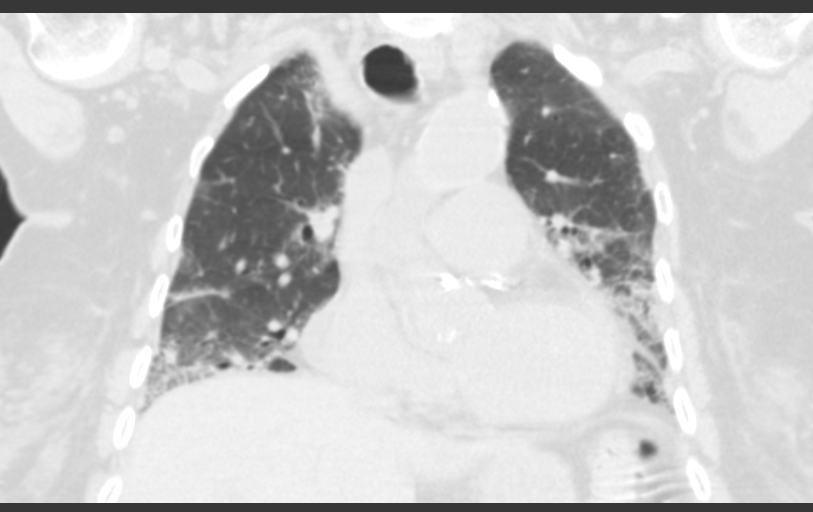
[im 45/75  lung]
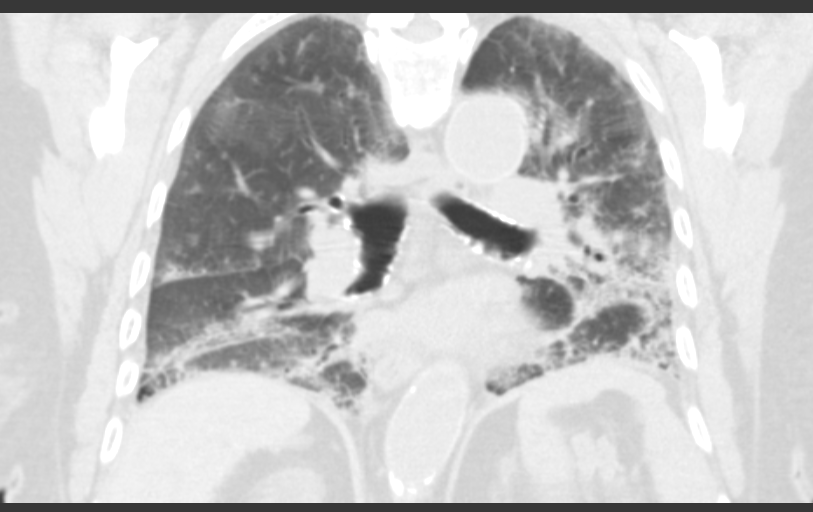

[14 of 36 positions shown; findings below may reference images not displayed]

FINDINGS: Small pericardial effusion with a maximum thickness of 8 mm. Small
left pleural effusion and minimal right pleural effusion.

Patchy interstitial and alveolar opacities in both lower lobes,
lingula, inferior left upper lobe, right middle lobe and minimally
in the inferior right upper lobe. Cylindrical bronchiectasis in
these areas, specially in the lower lobes. Diffuse peribronchial
thickening. Mild bullous changes in the lateral aspect of the right
middle lobe. No enlarged lymph nodes.

Diffuse low density of the blood relative to the arterial walls,
corresponding to anemia on the patient's recent labs. Atheromatous
arterial calcifications, including the coronary arteries.

Calcified granulomata in the included portion of the liver.
Low-density of the included portion of the upper pole of the right
kidney, relative to the left kidney. There was cortical thinning
with chronic severe hydronephrosis on the right on the ultrasound
dated 11/07/2012. Thoracic spine degenerative changes, including
changes of DISH.
IMPRESSION: 1. Bilateral multilobar pneumonia and/or chronic changes due to
bronchiectasis.
2. Chronic bronchitic changes and mild changes of COPD.
3. Atheromatous arterial calcifications, including the coronary
arteries.
4. Anemia.
5. Chronic right hydronephrosis with cortical thinning, most likely
explaining low density of the included portion of the upper pole of
the right kidney.
6. Small pericardial effusion, small left pleural effusion and
minimal right pleural effusion.

## 2016-09-28 IMAGING — CR DG CHEST 2V
2 series · 2 of 2 positions shown · non-contrast
Comparison: 08/04/2015 and earlier.

CLINICAL DATA: [AGE] with progressive generalized weakness
over the past few weeks and recent diagnosis of pneumonia.

EXAM:
CHEST  2 VIEW

[w chest lat]
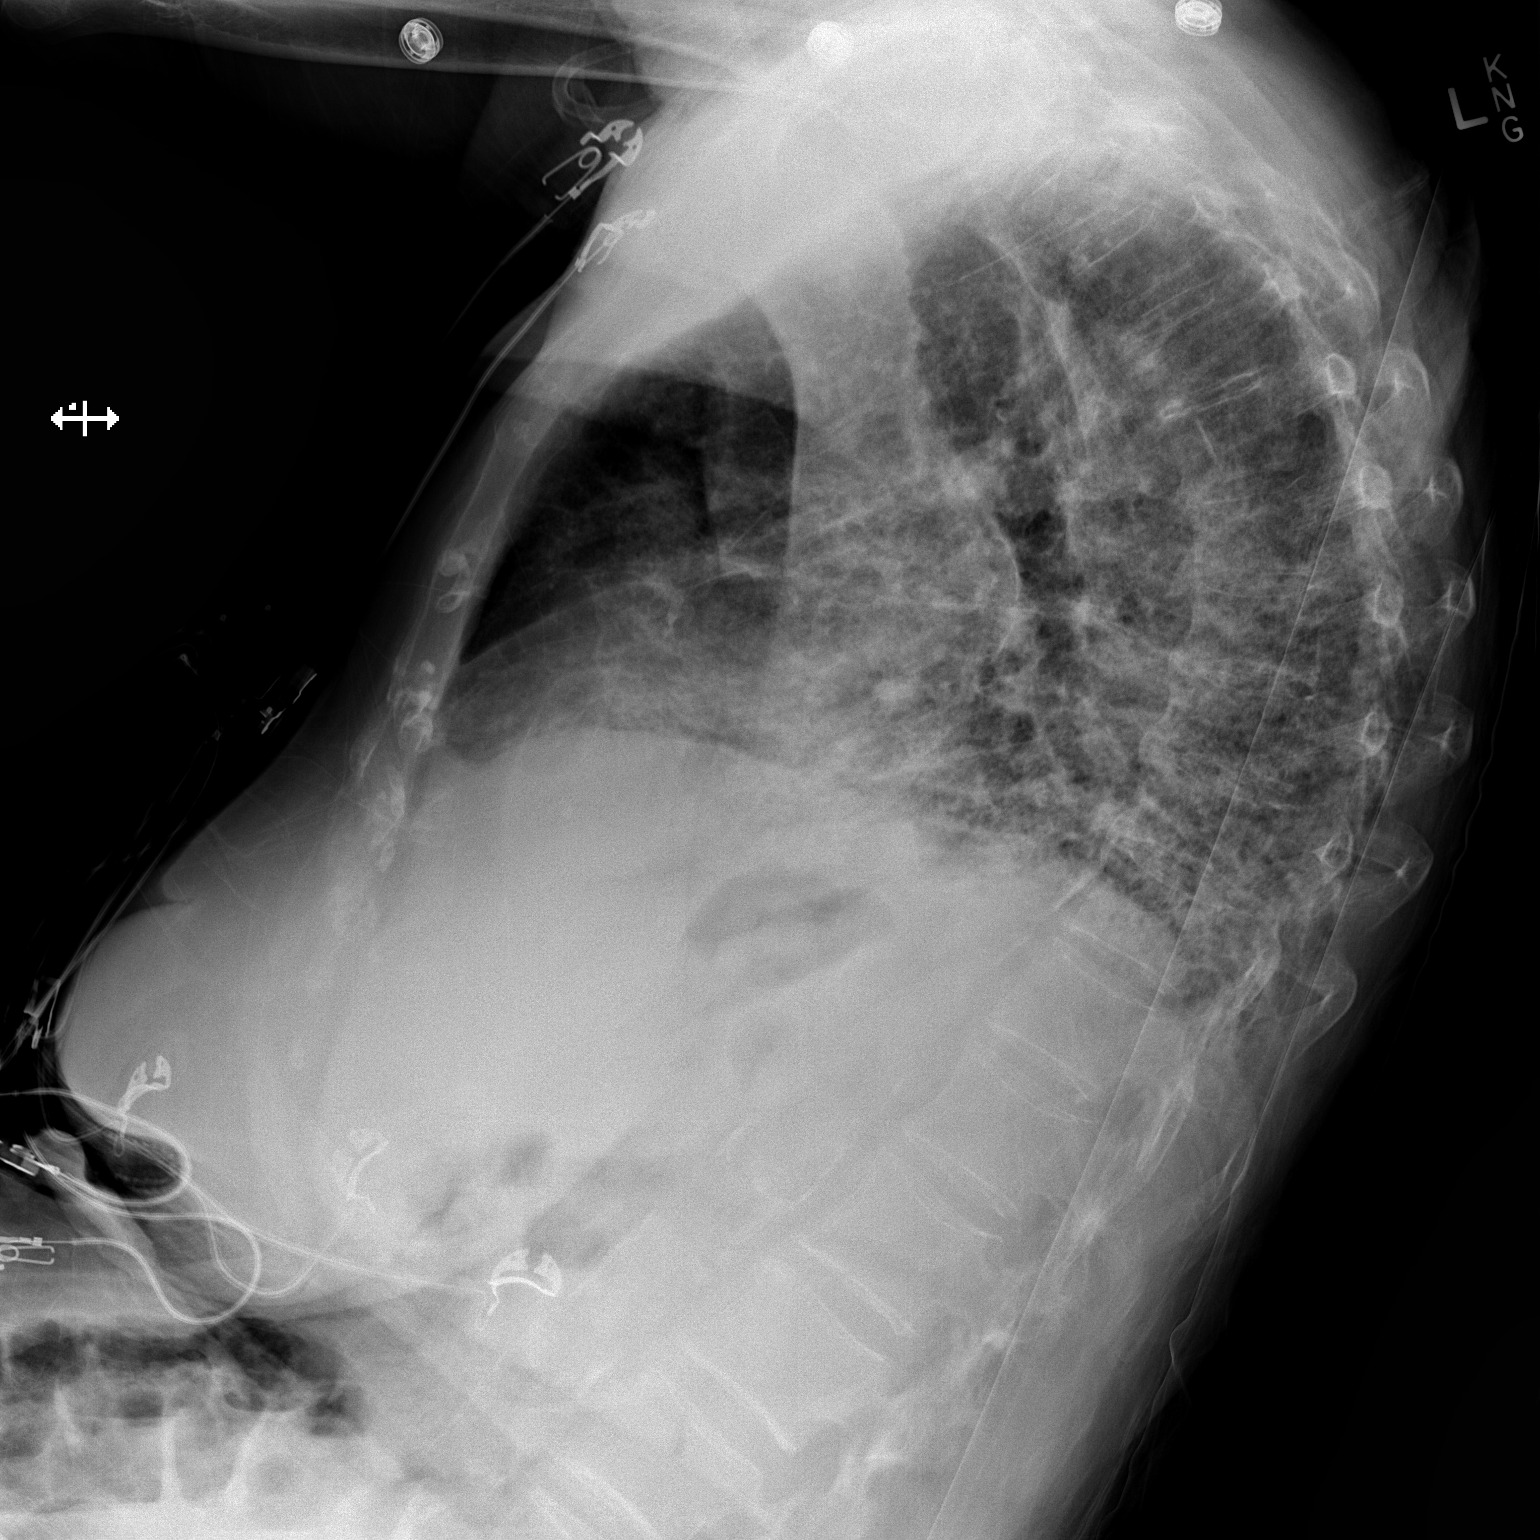

[x chest ap]
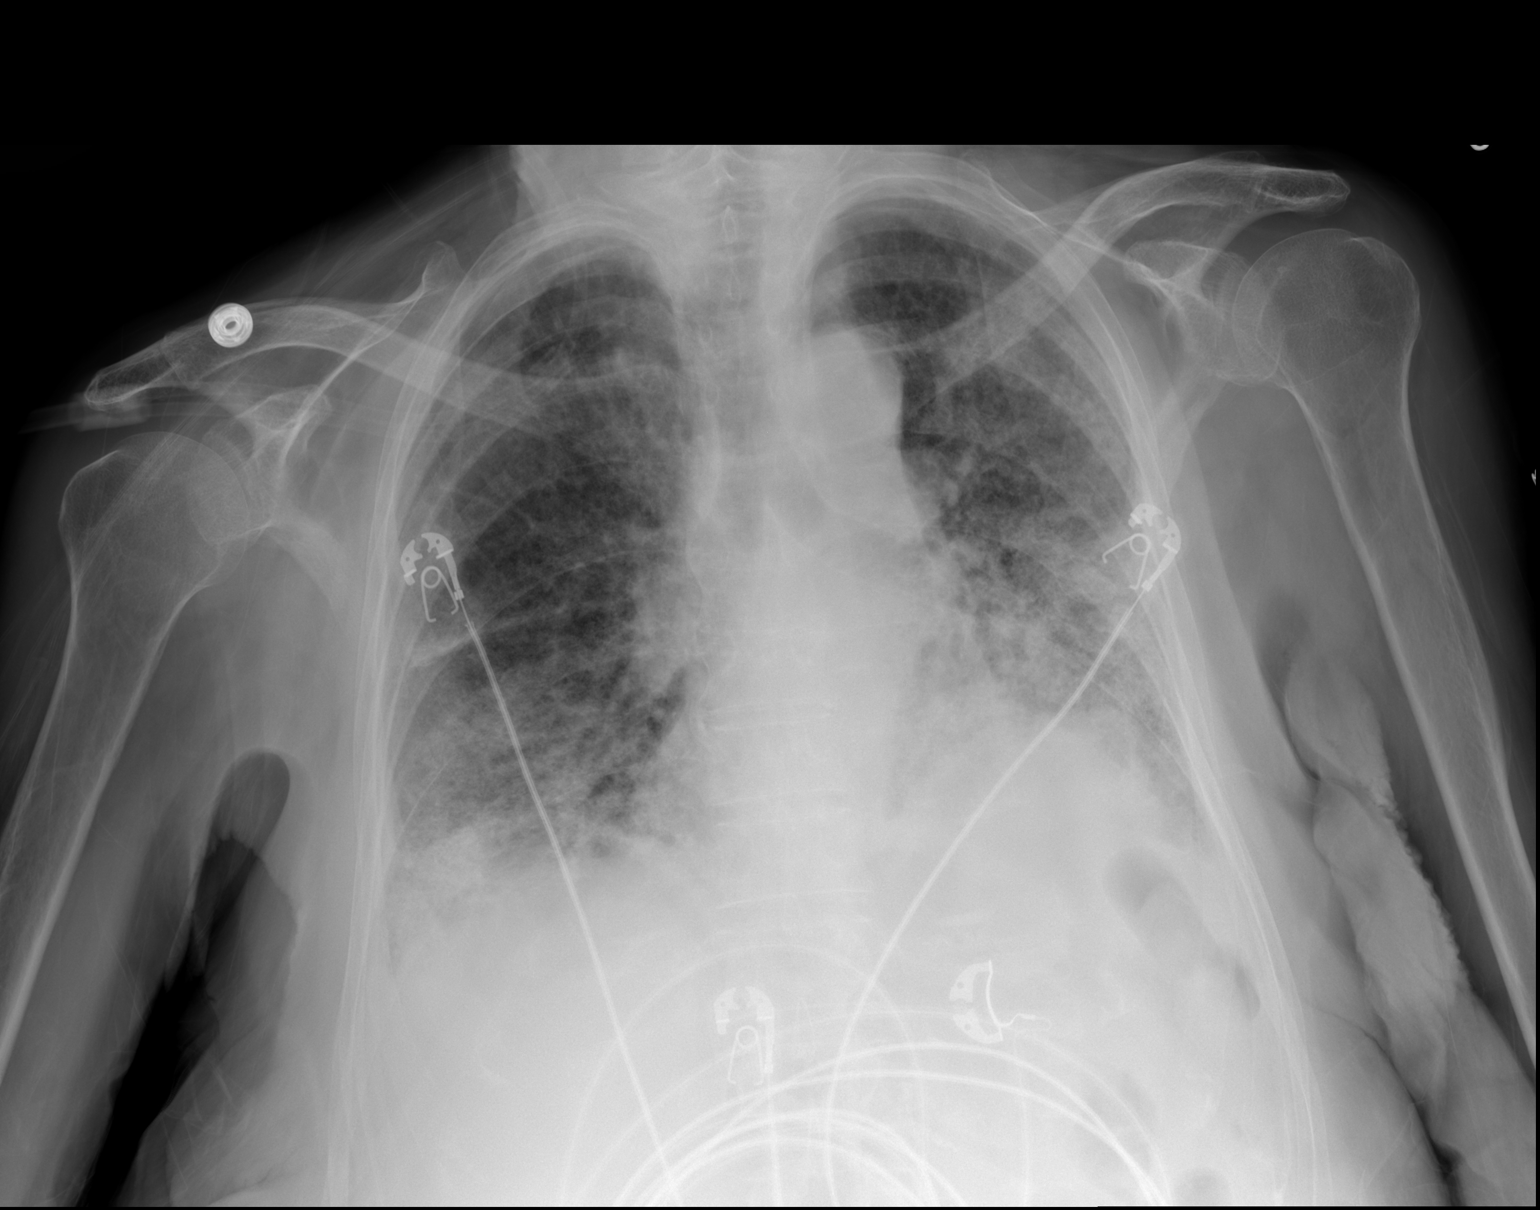

[2 of 2 positions shown; findings below may reference images not displayed]

FINDINGS: AP semi-erect and lateral images were obtained. Suboptimal
inspiration. Cardiac silhouette upper normal in size to mildly
enlarged for technique and degree of inspiration, unchanged.
Thoracic aorta mildly tortuous and atherosclerotic, unchanged.
Airspace consolidation throughout both lungs, not significantly
changed since the 08/04/2015 examination. No new pulmonary
parenchymal abnormalities. Stable small bilateral pleural effusions.
Exaggeration of the usual thoracic kyphosis.
IMPRESSION: Since the most recent chest x-ray 08/04/2015:

1. No significant change in the airspace consolidation throughout
both lungs and the associated small bilateral pleural effusions.
This likely reflects a combination of CHF and pneumonia.
2. No new abnormalities.
